# Patient Record
Sex: Female | Born: 1940
Health system: Southern US, Community
[De-identification: ages and names within clinical notes are randomized; demographics above are authoritative.]

## PROBLEM LIST (undated history)

## (undated) DIAGNOSIS — H269 Unspecified cataract: Secondary | ICD-10-CM

## (undated) DIAGNOSIS — G4730 Sleep apnea, unspecified: Secondary | ICD-10-CM

## (undated) DIAGNOSIS — Z9289 Personal history of other medical treatment: Secondary | ICD-10-CM

## (undated) DIAGNOSIS — M65319 Trigger thumb, unspecified thumb: Secondary | ICD-10-CM

## (undated) DIAGNOSIS — M810 Age-related osteoporosis without current pathological fracture: Secondary | ICD-10-CM

## (undated) DIAGNOSIS — E119 Type 2 diabetes mellitus without complications: Secondary | ICD-10-CM

## (undated) DIAGNOSIS — M1990 Unspecified osteoarthritis, unspecified site: Secondary | ICD-10-CM

## (undated) DIAGNOSIS — E538 Deficiency of other specified B group vitamins: Secondary | ICD-10-CM

## (undated) DIAGNOSIS — Z860100 Personal history of colon polyps, unspecified: Secondary | ICD-10-CM

## (undated) DIAGNOSIS — G8929 Other chronic pain: Secondary | ICD-10-CM

## (undated) DIAGNOSIS — J189 Pneumonia, unspecified organism: Secondary | ICD-10-CM

## (undated) DIAGNOSIS — N183 Chronic kidney disease, stage 3 unspecified: Secondary | ICD-10-CM

## (undated) DIAGNOSIS — E039 Hypothyroidism, unspecified: Secondary | ICD-10-CM

## (undated) DIAGNOSIS — R519 Headache, unspecified: Secondary | ICD-10-CM

## (undated) DIAGNOSIS — T7840XA Allergy, unspecified, initial encounter: Secondary | ICD-10-CM

## (undated) DIAGNOSIS — G5600 Carpal tunnel syndrome, unspecified upper limb: Secondary | ICD-10-CM

## (undated) DIAGNOSIS — G629 Polyneuropathy, unspecified: Secondary | ICD-10-CM

## (undated) DIAGNOSIS — R51 Headache: Secondary | ICD-10-CM

## (undated) DIAGNOSIS — I5032 Chronic diastolic (congestive) heart failure: Secondary | ICD-10-CM

## (undated) DIAGNOSIS — D649 Anemia, unspecified: Secondary | ICD-10-CM

## (undated) DIAGNOSIS — M5030 Other cervical disc degeneration, unspecified cervical region: Secondary | ICD-10-CM

## (undated) DIAGNOSIS — D573 Sickle-cell trait: Secondary | ICD-10-CM

## (undated) DIAGNOSIS — I10 Essential (primary) hypertension: Secondary | ICD-10-CM

## (undated) DIAGNOSIS — M542 Cervicalgia: Secondary | ICD-10-CM

## (undated) DIAGNOSIS — K279 Peptic ulcer, site unspecified, unspecified as acute or chronic, without hemorrhage or perforation: Secondary | ICD-10-CM

## (undated) DIAGNOSIS — E785 Hyperlipidemia, unspecified: Secondary | ICD-10-CM

## (undated) HISTORY — DX: Sleep apnea, unspecified: G47.30

## (undated) HISTORY — DX: Hyperlipidemia, unspecified: E78.5

## (undated) HISTORY — PX: TIBIA HARDWARE REMOVAL: SUR1133

## (undated) HISTORY — PX: FRACTURE SURGERY: SHX138

## (undated) HISTORY — PX: COLONOSCOPY: SHX174

## (undated) HISTORY — DX: Unspecified osteoarthritis, unspecified site: M19.90

## (undated) HISTORY — DX: Allergy, unspecified, initial encounter: T78.40XA

## (undated) HISTORY — DX: Chronic diastolic (congestive) heart failure: I50.32

## (undated) HISTORY — DX: Carpal tunnel syndrome, unspecified upper limb: G56.00

## (undated) HISTORY — DX: Other chronic pain: G89.29

## (undated) HISTORY — PX: JOINT REPLACEMENT: SHX530

## (undated) HISTORY — PX: ANKLE HARDWARE REMOVAL: SHX1149

## (undated) HISTORY — DX: Trigger thumb, unspecified thumb: M65.319

## (undated) HISTORY — PX: TOTAL KNEE ARTHROPLASTY: SHX125

## (undated) HISTORY — DX: Anemia, unspecified: D64.9

## (undated) HISTORY — DX: Hypothyroidism, unspecified: E03.9

## (undated) HISTORY — DX: Headache: R51

## (undated) HISTORY — DX: Headache, unspecified: R51.9

## (undated) HISTORY — DX: Polyneuropathy, unspecified: G62.9

## (undated) HISTORY — PX: CATARACT EXTRACTION W/ INTRAOCULAR LENS  IMPLANT, BILATERAL: SHX1307

## (undated) HISTORY — DX: Sickle-cell trait: D57.3

## (undated) HISTORY — DX: Other cervical disc degeneration, unspecified cervical region: M50.30

## (undated) HISTORY — DX: Personal history of colonic polyps: Z86.010

## (undated) HISTORY — DX: Peptic ulcer, site unspecified, unspecified as acute or chronic, without hemorrhage or perforation: K27.9

## (undated) HISTORY — DX: Personal history of colon polyps, unspecified: Z86.0100

## (undated) HISTORY — PX: ABDOMINAL HYSTERECTOMY: SHX81

## (undated) HISTORY — DX: Unspecified cataract: H26.9

## (undated) HISTORY — PX: TIBIA FRACTURE SURGERY: SHX806

## (undated) HISTORY — DX: Deficiency of other specified B group vitamins: E53.8

## (undated) HISTORY — DX: Age-related osteoporosis without current pathological fracture: M81.0

## (undated) HISTORY — PX: EXPLORATORY LAPAROTOMY: SUR591

## (undated) HISTORY — DX: Cervicalgia: M54.2

## (undated) HISTORY — DX: Essential (primary) hypertension: I10

---
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
## 1998-04-17 ENCOUNTER — Ambulatory Visit (HOSPITAL_COMMUNITY): Admission: RE | Admit: 1998-04-17 | Discharge: 1998-04-17 | Payer: Self-pay | Admitting: Family Medicine

## 1998-10-12 ENCOUNTER — Ambulatory Visit (HOSPITAL_COMMUNITY): Admission: RE | Admit: 1998-10-12 | Discharge: 1998-10-12 | Payer: Self-pay | Admitting: Family Medicine

## 1998-10-12 ENCOUNTER — Encounter: Payer: Self-pay | Admitting: Family Medicine

## 1998-11-26 ENCOUNTER — Ambulatory Visit (HOSPITAL_COMMUNITY): Admission: RE | Admit: 1998-11-26 | Discharge: 1998-11-26 | Payer: Self-pay | Admitting: Family Medicine

## 1998-11-26 ENCOUNTER — Encounter: Payer: Self-pay | Admitting: Family Medicine

## 1999-08-10 ENCOUNTER — Encounter: Payer: Self-pay | Admitting: Gastroenterology

## 1999-08-10 ENCOUNTER — Ambulatory Visit (HOSPITAL_COMMUNITY): Admission: RE | Admit: 1999-08-10 | Discharge: 1999-08-10 | Payer: Self-pay | Admitting: Gastroenterology

## 2000-01-13 ENCOUNTER — Ambulatory Visit (HOSPITAL_COMMUNITY): Admission: RE | Admit: 2000-01-13 | Discharge: 2000-01-13 | Payer: Self-pay | Admitting: Gastroenterology

## 2000-03-24 ENCOUNTER — Ambulatory Visit (HOSPITAL_COMMUNITY): Admission: RE | Admit: 2000-03-24 | Discharge: 2000-03-24 | Payer: Self-pay | Admitting: *Deleted

## 2000-10-26 ENCOUNTER — Encounter: Payer: Self-pay | Admitting: Family Medicine

## 2000-10-26 ENCOUNTER — Ambulatory Visit (HOSPITAL_COMMUNITY): Admission: RE | Admit: 2000-10-26 | Discharge: 2000-10-26 | Payer: Self-pay | Admitting: Family Medicine

## 2000-12-11 ENCOUNTER — Other Ambulatory Visit: Admission: RE | Admit: 2000-12-11 | Discharge: 2000-12-11 | Payer: Self-pay | Admitting: Family Medicine

## 2002-05-13 ENCOUNTER — Emergency Department (HOSPITAL_COMMUNITY): Admission: EM | Admit: 2002-05-13 | Discharge: 2002-05-13 | Payer: Self-pay | Admitting: Emergency Medicine

## 2003-05-02 ENCOUNTER — Ambulatory Visit (HOSPITAL_COMMUNITY): Admission: RE | Admit: 2003-05-02 | Discharge: 2003-05-02 | Payer: Self-pay | Admitting: Family Medicine

## 2003-05-02 ENCOUNTER — Ambulatory Visit (HOSPITAL_COMMUNITY): Admission: RE | Admit: 2003-05-02 | Discharge: 2003-05-02 | Payer: Self-pay | Admitting: Internal Medicine

## 2003-05-02 ENCOUNTER — Encounter: Payer: Self-pay | Admitting: Family Medicine

## 2003-05-02 ENCOUNTER — Encounter: Payer: Self-pay | Admitting: Internal Medicine

## 2004-01-01 ENCOUNTER — Other Ambulatory Visit: Admission: RE | Admit: 2004-01-01 | Discharge: 2004-01-01 | Payer: Self-pay | Admitting: Family Medicine

## 2004-11-14 DIAGNOSIS — Z9289 Personal history of other medical treatment: Secondary | ICD-10-CM

## 2004-11-14 HISTORY — PX: FEMUR FRACTURE SURGERY: SHX633

## 2004-11-14 HISTORY — DX: Personal history of other medical treatment: Z92.89

## 2005-02-21 ENCOUNTER — Emergency Department (HOSPITAL_COMMUNITY): Admission: EM | Admit: 2005-02-21 | Discharge: 2005-02-22 | Payer: Self-pay | Admitting: Emergency Medicine

## 2005-03-03 ENCOUNTER — Ambulatory Visit: Payer: Self-pay | Admitting: Internal Medicine

## 2005-03-03 ENCOUNTER — Ambulatory Visit (HOSPITAL_COMMUNITY): Admission: RE | Admit: 2005-03-03 | Discharge: 2005-03-03 | Payer: Self-pay | Admitting: Internal Medicine

## 2005-05-30 ENCOUNTER — Encounter: Payer: Self-pay | Admitting: Internal Medicine

## 2005-06-29 ENCOUNTER — Inpatient Hospital Stay (HOSPITAL_COMMUNITY): Admission: AC | Admit: 2005-06-29 | Discharge: 2005-07-06 | Payer: Self-pay | Admitting: Emergency Medicine

## 2005-06-29 ENCOUNTER — Ambulatory Visit: Payer: Self-pay | Admitting: Physical Medicine & Rehabilitation

## 2005-07-03 ENCOUNTER — Ambulatory Visit: Payer: Self-pay | Admitting: Pulmonary Disease

## 2005-07-06 ENCOUNTER — Inpatient Hospital Stay (HOSPITAL_COMMUNITY)
Admission: RE | Admit: 2005-07-06 | Discharge: 2005-07-22 | Payer: Self-pay | Admitting: Physical Medicine & Rehabilitation

## 2005-08-22 ENCOUNTER — Ambulatory Visit: Payer: Self-pay | Admitting: Nurse Practitioner

## 2005-09-09 ENCOUNTER — Ambulatory Visit: Payer: Self-pay | Admitting: *Deleted

## 2006-01-12 ENCOUNTER — Ambulatory Visit: Payer: Self-pay | Admitting: Internal Medicine

## 2006-03-07 ENCOUNTER — Encounter: Payer: Self-pay | Admitting: Internal Medicine

## 2007-06-15 DIAGNOSIS — G5600 Carpal tunnel syndrome, unspecified upper limb: Secondary | ICD-10-CM

## 2007-06-15 HISTORY — DX: Carpal tunnel syndrome, unspecified upper limb: G56.00

## 2007-06-25 ENCOUNTER — Ambulatory Visit: Payer: Self-pay | Admitting: Internal Medicine

## 2007-06-25 LAB — CONVERTED CEMR LAB
AST: 24 units/L (ref 0–37)
Alkaline Phosphatase: 65 units/L (ref 39–117)
Basophils Absolute: 0 10*3/uL (ref 0.0–0.1)
Bilirubin, Direct: 0.1 mg/dL (ref 0.0–0.3)
CO2: 29 meq/L (ref 19–32)
Chloride: 107 meq/L (ref 96–112)
Eosinophils Relative: 11.3 % — ABNORMAL HIGH (ref 0.0–5.0)
GFR calc Af Amer: 49 mL/min
GFR calc non Af Amer: 40 mL/min
Glucose, Bld: 100 mg/dL — ABNORMAL HIGH (ref 70–99)
HDL: 36.6 mg/dL — ABNORMAL LOW (ref >39.0)
INR: 0.9 (ref 0.9–2.0)
LDL Cholesterol: 92 mg/dL (ref 0–99)
Lymphocytes Relative: 33.4 % (ref 12.0–46.0)
MCHC: 34.2 g/dL (ref 30.0–36.0)
Neutro Abs: 3 10*3/uL (ref 1.4–7.7)
Neutrophils Relative %: 46.5 % (ref 43.0–77.0)
Platelets: 132 10*3/uL — ABNORMAL LOW (ref 150–400)
Total Bilirubin: 0.8 mg/dL (ref 0.3–1.2)
VLDL: 27 mg/dL (ref 0–40)
WBC: 6.3 10*3/uL (ref 4.5–10.5)

## 2007-07-25 ENCOUNTER — Ambulatory Visit: Payer: Self-pay | Admitting: Gastroenterology

## 2007-07-31 ENCOUNTER — Encounter: Payer: Self-pay | Admitting: Gastroenterology

## 2007-07-31 ENCOUNTER — Ambulatory Visit (HOSPITAL_COMMUNITY): Admission: RE | Admit: 2007-07-31 | Discharge: 2007-07-31 | Payer: Self-pay | Admitting: Gastroenterology

## 2007-08-02 ENCOUNTER — Ambulatory Visit: Payer: Self-pay | Admitting: Gastroenterology

## 2007-11-05 ENCOUNTER — Ambulatory Visit: Payer: Self-pay | Admitting: Internal Medicine

## 2007-11-05 DIAGNOSIS — M79609 Pain in unspecified limb: Secondary | ICD-10-CM | POA: Insufficient documentation

## 2007-11-05 DIAGNOSIS — B019 Varicella without complication: Secondary | ICD-10-CM | POA: Insufficient documentation

## 2007-11-05 DIAGNOSIS — M6740 Ganglion, unspecified site: Secondary | ICD-10-CM | POA: Insufficient documentation

## 2007-11-05 DIAGNOSIS — R079 Chest pain, unspecified: Secondary | ICD-10-CM | POA: Insufficient documentation

## 2007-11-10 DIAGNOSIS — E039 Hypothyroidism, unspecified: Secondary | ICD-10-CM | POA: Insufficient documentation

## 2007-11-10 DIAGNOSIS — E785 Hyperlipidemia, unspecified: Secondary | ICD-10-CM | POA: Insufficient documentation

## 2007-11-10 DIAGNOSIS — J45909 Unspecified asthma, uncomplicated: Secondary | ICD-10-CM

## 2007-11-10 DIAGNOSIS — J309 Allergic rhinitis, unspecified: Secondary | ICD-10-CM

## 2007-11-10 DIAGNOSIS — D638 Anemia in other chronic diseases classified elsewhere: Secondary | ICD-10-CM

## 2007-11-10 DIAGNOSIS — Z860100 Personal history of colon polyps, unspecified: Secondary | ICD-10-CM | POA: Insufficient documentation

## 2007-11-10 DIAGNOSIS — K279 Peptic ulcer, site unspecified, unspecified as acute or chronic, without hemorrhage or perforation: Secondary | ICD-10-CM | POA: Insufficient documentation

## 2007-11-22 ENCOUNTER — Encounter: Payer: Self-pay | Admitting: Internal Medicine

## 2007-11-22 ENCOUNTER — Telehealth: Payer: Self-pay | Admitting: Internal Medicine

## 2008-01-31 DIAGNOSIS — G5603 Carpal tunnel syndrome, bilateral upper limbs: Secondary | ICD-10-CM

## 2008-01-31 DIAGNOSIS — IMO0002 Reserved for concepts with insufficient information to code with codable children: Secondary | ICD-10-CM | POA: Insufficient documentation

## 2008-01-31 DIAGNOSIS — K649 Unspecified hemorrhoids: Secondary | ICD-10-CM | POA: Insufficient documentation

## 2008-01-31 DIAGNOSIS — D573 Sickle-cell trait: Secondary | ICD-10-CM | POA: Insufficient documentation

## 2008-01-31 DIAGNOSIS — K625 Hemorrhage of anus and rectum: Secondary | ICD-10-CM | POA: Insufficient documentation

## 2008-03-13 ENCOUNTER — Ambulatory Visit: Payer: Self-pay | Admitting: Internal Medicine

## 2008-03-13 DIAGNOSIS — I10 Essential (primary) hypertension: Secondary | ICD-10-CM | POA: Insufficient documentation

## 2008-03-17 ENCOUNTER — Encounter: Payer: Self-pay | Admitting: Internal Medicine

## 2008-05-30 ENCOUNTER — Ambulatory Visit: Payer: Self-pay | Admitting: Internal Medicine

## 2008-05-30 DIAGNOSIS — R05 Cough: Secondary | ICD-10-CM

## 2008-05-30 DIAGNOSIS — J069 Acute upper respiratory infection, unspecified: Secondary | ICD-10-CM | POA: Insufficient documentation

## 2008-06-30 ENCOUNTER — Encounter: Payer: Self-pay | Admitting: Internal Medicine

## 2008-06-30 ENCOUNTER — Telehealth (INDEPENDENT_AMBULATORY_CARE_PROVIDER_SITE_OTHER): Payer: Self-pay | Admitting: *Deleted

## 2008-08-18 ENCOUNTER — Encounter: Payer: Self-pay | Admitting: Internal Medicine

## 2008-08-27 ENCOUNTER — Ambulatory Visit: Payer: Self-pay | Admitting: Internal Medicine

## 2008-08-27 DIAGNOSIS — R413 Other amnesia: Secondary | ICD-10-CM

## 2008-09-03 ENCOUNTER — Ambulatory Visit: Payer: Self-pay

## 2008-09-03 ENCOUNTER — Encounter: Payer: Self-pay | Admitting: Internal Medicine

## 2008-09-04 ENCOUNTER — Encounter: Payer: Self-pay | Admitting: Internal Medicine

## 2008-09-06 ENCOUNTER — Encounter: Admission: RE | Admit: 2008-09-06 | Discharge: 2008-09-06 | Payer: Self-pay | Admitting: Internal Medicine

## 2008-09-22 ENCOUNTER — Ambulatory Visit: Payer: Self-pay | Admitting: Internal Medicine

## 2008-09-22 ENCOUNTER — Telehealth: Payer: Self-pay | Admitting: Internal Medicine

## 2008-10-14 ENCOUNTER — Inpatient Hospital Stay (HOSPITAL_COMMUNITY): Admission: RE | Admit: 2008-10-14 | Discharge: 2008-10-17 | Payer: Self-pay | Admitting: Orthopedic Surgery

## 2008-10-20 ENCOUNTER — Ambulatory Visit: Payer: Self-pay | Admitting: Internal Medicine

## 2008-10-20 DIAGNOSIS — D6489 Other specified anemias: Secondary | ICD-10-CM

## 2008-10-20 DIAGNOSIS — D696 Thrombocytopenia, unspecified: Secondary | ICD-10-CM

## 2008-10-20 DIAGNOSIS — N189 Chronic kidney disease, unspecified: Secondary | ICD-10-CM

## 2008-10-20 LAB — CONVERTED CEMR LAB
Basophils Absolute: 0 10*3/uL (ref 0.0–0.1)
Basophils Relative: 0 % (ref 0.0–3.0)
Calcium: 9.5 mg/dL (ref 8.4–10.5)
Creatinine, Ser: 1.5 mg/dL — ABNORMAL HIGH (ref 0.4–1.2)
Eosinophils Absolute: 0.2 10*3/uL (ref 0.0–0.7)
Eosinophils Relative: 2 % (ref 0.0–5.0)
GFR calc Af Amer: 45 mL/min
GFR calc non Af Amer: 37 mL/min
HCT: 30.9 % — ABNORMAL LOW (ref 36.0–46.0)
MCV: 91.9 fL (ref 78.0–100.0)
Monocytes Absolute: 1 10*3/uL (ref 0.1–1.0)
Neutrophils Relative %: 69.6 % (ref 43.0–77.0)
Platelets: 174 10*3/uL (ref 150–400)
RDW: 14.6 % (ref 11.5–14.6)
Sodium: 141 meq/L (ref 135–145)
WBC: 10.1 10*3/uL (ref 4.5–10.5)

## 2008-10-21 ENCOUNTER — Encounter: Payer: Self-pay | Admitting: Internal Medicine

## 2008-11-05 ENCOUNTER — Ambulatory Visit: Payer: Self-pay | Admitting: Internal Medicine

## 2008-11-05 DIAGNOSIS — R7302 Impaired glucose tolerance (oral): Secondary | ICD-10-CM | POA: Insufficient documentation

## 2008-11-05 DIAGNOSIS — M5430 Sciatica, unspecified side: Secondary | ICD-10-CM

## 2008-11-05 LAB — CONVERTED CEMR LAB
BUN: 21 mg/dL (ref 6–23)
Basophils Relative: 0.2 % (ref 0.0–3.0)
CO2: 31 meq/L (ref 19–32)
Calcium: 9.4 mg/dL (ref 8.4–10.5)
Creatinine, Ser: 1.4 mg/dL — ABNORMAL HIGH (ref 0.4–1.2)
GFR calc Af Amer: 48 mL/min
HCT: 33.3 % — ABNORMAL LOW (ref 36.0–46.0)
Hgb A1c MFr Bld: 6.2 % — ABNORMAL HIGH (ref 4.6–6.0)
Iron: 69 ug/dL (ref 42–145)
MCHC: 33.8 g/dL (ref 30.0–36.0)
MCV: 93.7 fL (ref 78.0–100.0)
Monocytes Relative: 8.1 % (ref 3.0–12.0)
Neutro Abs: 7.1 10*3/uL (ref 1.4–7.7)
Neutrophils Relative %: 70.7 % (ref 43.0–77.0)
Platelets: 161 10*3/uL (ref 150–400)
Potassium: 4 meq/L (ref 3.5–5.1)
RDW: 14.1 % (ref 11.5–14.6)

## 2008-11-18 ENCOUNTER — Telehealth (INDEPENDENT_AMBULATORY_CARE_PROVIDER_SITE_OTHER): Payer: Self-pay | Admitting: *Deleted

## 2008-12-05 ENCOUNTER — Ambulatory Visit: Payer: Self-pay | Admitting: Internal Medicine

## 2008-12-05 ENCOUNTER — Telehealth: Payer: Self-pay | Admitting: Internal Medicine

## 2008-12-17 ENCOUNTER — Telehealth (INDEPENDENT_AMBULATORY_CARE_PROVIDER_SITE_OTHER): Payer: Self-pay | Admitting: *Deleted

## 2009-01-07 ENCOUNTER — Encounter: Payer: Self-pay | Admitting: Internal Medicine

## 2009-01-08 ENCOUNTER — Ambulatory Visit: Payer: Self-pay | Admitting: Internal Medicine

## 2009-01-16 ENCOUNTER — Inpatient Hospital Stay (HOSPITAL_COMMUNITY): Admission: EM | Admit: 2009-01-16 | Discharge: 2009-01-20 | Payer: Self-pay | Admitting: Emergency Medicine

## 2009-01-19 ENCOUNTER — Ambulatory Visit: Payer: Self-pay | Admitting: Physical Medicine & Rehabilitation

## 2009-01-27 ENCOUNTER — Encounter: Payer: Self-pay | Admitting: Internal Medicine

## 2009-02-03 ENCOUNTER — Encounter: Payer: Self-pay | Admitting: Internal Medicine

## 2009-04-09 ENCOUNTER — Encounter (INDEPENDENT_AMBULATORY_CARE_PROVIDER_SITE_OTHER): Payer: Self-pay | Admitting: *Deleted

## 2009-04-16 ENCOUNTER — Telehealth: Payer: Self-pay | Admitting: Internal Medicine

## 2009-04-24 ENCOUNTER — Encounter: Payer: Self-pay | Admitting: Internal Medicine

## 2009-05-01 ENCOUNTER — Encounter: Payer: Self-pay | Admitting: Internal Medicine

## 2009-05-11 ENCOUNTER — Ambulatory Visit: Payer: Self-pay | Admitting: Internal Medicine

## 2009-05-11 DIAGNOSIS — D509 Iron deficiency anemia, unspecified: Secondary | ICD-10-CM

## 2009-05-11 DIAGNOSIS — S82899A Other fracture of unspecified lower leg, initial encounter for closed fracture: Secondary | ICD-10-CM | POA: Insufficient documentation

## 2009-05-11 LAB — CONVERTED CEMR LAB
Basophils Absolute: 0 10*3/uL (ref 0.0–0.1)
Basophils Relative: 0.3 % (ref 0.0–3.0)
CO2: 30 meq/L (ref 19–32)
Chloride: 106 meq/L (ref 96–112)
Creatinine, Ser: 1.2 mg/dL (ref 0.4–1.2)
Eosinophils Absolute: 0.1 10*3/uL (ref 0.0–0.7)
HCT: 36.8 % (ref 36.0–46.0)
HDL: 42.7 mg/dL (ref >39.00)
Hemoglobin: 12.7 g/dL (ref 12.0–15.0)
Hgb A1c MFr Bld: 5.6 % (ref 4.6–6.5)
LDL Cholesterol: 118 mg/dL — ABNORMAL HIGH (ref 0–99)
Lymphs Abs: 1.6 10*3/uL (ref 0.7–4.0)
MCHC: 34.4 g/dL (ref 30.0–36.0)
MCV: 87.5 fL (ref 78.0–100.0)
Monocytes Absolute: 0.5 10*3/uL (ref 0.1–1.0)
Neutro Abs: 3 10*3/uL (ref 1.4–7.7)
Potassium: 4.2 meq/L (ref 3.5–5.1)
RDW: 12.8 % (ref 11.5–14.6)
Total CHOL/HDL Ratio: 4
Transferrin: 225.1 mg/dL (ref 212.0–360.0)
Triglycerides: 106 mg/dL (ref 0.0–149.0)
VLDL: 21.2 mg/dL (ref 0.0–40.0)

## 2009-05-12 ENCOUNTER — Encounter: Payer: Self-pay | Admitting: Internal Medicine

## 2009-05-12 ENCOUNTER — Ambulatory Visit: Payer: Self-pay | Admitting: Internal Medicine

## 2009-05-20 ENCOUNTER — Telehealth (INDEPENDENT_AMBULATORY_CARE_PROVIDER_SITE_OTHER): Payer: Self-pay | Admitting: *Deleted

## 2009-05-27 ENCOUNTER — Encounter: Admission: RE | Admit: 2009-05-27 | Discharge: 2009-05-27 | Payer: Self-pay | Admitting: Specialist

## 2009-07-23 ENCOUNTER — Ambulatory Visit: Payer: Self-pay | Admitting: Internal Medicine

## 2009-07-23 ENCOUNTER — Telehealth: Payer: Self-pay | Admitting: Internal Medicine

## 2009-07-23 DIAGNOSIS — R209 Unspecified disturbances of skin sensation: Secondary | ICD-10-CM | POA: Insufficient documentation

## 2009-07-24 ENCOUNTER — Telehealth (INDEPENDENT_AMBULATORY_CARE_PROVIDER_SITE_OTHER): Payer: Self-pay | Admitting: *Deleted

## 2009-12-14 ENCOUNTER — Encounter: Payer: Self-pay | Admitting: Internal Medicine

## 2010-01-21 ENCOUNTER — Ambulatory Visit: Payer: Self-pay | Admitting: Internal Medicine

## 2010-03-25 ENCOUNTER — Encounter: Payer: Self-pay | Admitting: Internal Medicine

## 2010-03-29 ENCOUNTER — Encounter: Payer: Self-pay | Admitting: Internal Medicine

## 2010-03-30 ENCOUNTER — Encounter: Payer: Self-pay | Admitting: Internal Medicine

## 2010-04-21 ENCOUNTER — Ambulatory Visit: Payer: Self-pay | Admitting: Internal Medicine

## 2010-04-21 DIAGNOSIS — R5383 Other fatigue: Secondary | ICD-10-CM | POA: Insufficient documentation

## 2010-04-21 DIAGNOSIS — E559 Vitamin D deficiency, unspecified: Secondary | ICD-10-CM

## 2010-04-21 DIAGNOSIS — G609 Hereditary and idiopathic neuropathy, unspecified: Secondary | ICD-10-CM | POA: Insufficient documentation

## 2010-04-22 LAB — CONVERTED CEMR LAB
ALT: 15 U/L (ref 0–35)
AST: 21 U/L (ref 0–37)
Albumin: 4.1 g/dL (ref 3.5–5.2)
Alkaline Phosphatase: 62 U/L (ref 39–117)
BUN: 11 mg/dL (ref 6–23)
Basophils Absolute: 0 K/uL (ref 0.0–0.1)
Basophils Relative: 0.3 % (ref 0.0–3.0)
Bilirubin Urine: NEGATIVE
Bilirubin, Direct: 0.1 mg/dL (ref 0.0–0.3)
CO2: 30 meq/L (ref 19–32)
Calcium: 9.1 mg/dL (ref 8.4–10.5)
Chloride: 107 meq/L (ref 96–112)
Cholesterol: 191 mg/dL (ref 0–200)
Creatinine, Ser: 1.2 mg/dL (ref 0.4–1.2)
Creatinine,U: 164.6 mg/dL
Eosinophils Absolute: 0.1 K/uL (ref 0.0–0.7)
Eosinophils Relative: 1.3 % (ref 0.0–5.0)
Folate: 10.3 ng/mL
GFR calc non Af Amer: 55.22 mL/min (ref >60)
Glucose, Bld: 95 mg/dL (ref 70–99)
HCT: 35.2 % — ABNORMAL LOW (ref 36.0–46.0)
HDL: 46.8 mg/dL (ref >39.00)
Hemoglobin: 12 g/dL (ref 12.0–15.0)
Hgb A1c MFr Bld: 5.6 % (ref 4.6–6.5)
Iron: 90 ug/dL (ref 42–145)
Ketones, ur: NEGATIVE mg/dL
LDL Cholesterol: 109 mg/dL — ABNORMAL HIGH (ref 0–99)
Lymphocytes Relative: 33.6 % (ref 12.0–46.0)
Lymphs Abs: 1.8 K/uL (ref 0.7–4.0)
MCHC: 34 g/dL (ref 30.0–36.0)
MCV: 88.7 fL (ref 78.0–100.0)
Microalb Creat Ratio: 0.6 mg/g (ref 0.0–30.0)
Microalb, Ur: 1 mg/dL (ref 0.0–1.9)
Monocytes Absolute: 0.6 K/uL (ref 0.1–1.0)
Monocytes Relative: 10.3 % (ref 3.0–12.0)
Neutro Abs: 2.9 K/uL (ref 1.4–7.7)
Neutrophils Relative %: 54.5 % (ref 43.0–77.0)
Nitrite: NEGATIVE
Platelets: 92 K/uL — ABNORMAL LOW (ref 150.0–400.0)
Potassium: 3.9 meq/L (ref 3.5–5.1)
RBC: 3.97 M/uL (ref 3.87–5.11)
RDW: 13.8 % (ref 11.5–14.6)
Saturation Ratios: 26.6 % (ref 20.0–50.0)
Sed Rate: 45 mm/h — ABNORMAL HIGH (ref 0–22)
Sodium: 144 meq/L (ref 135–145)
Specific Gravity, Urine: 1.02 (ref 1.000–1.030)
TSH: 3.96 u[IU]/mL (ref 0.35–5.50)
Total Bilirubin: 0.6 mg/dL (ref 0.3–1.2)
Total CHOL/HDL Ratio: 4
Total Protein, Urine: NEGATIVE mg/dL
Total Protein: 7.4 g/dL (ref 6.0–8.3)
Transferrin: 241.5 mg/dL (ref 212.0–360.0)
Triglycerides: 174 mg/dL — ABNORMAL HIGH (ref 0.0–149.0)
Urine Glucose: NEGATIVE mg/dL
Urobilinogen, UA: 0.2 (ref 0.0–1.0)
VLDL: 34.8 mg/dL (ref 0.0–40.0)
Vitamin B-12: 468 pg/mL (ref 211–911)
WBC: 5.4 10*3/microliter (ref 4.5–10.5)
pH: 6 (ref 5.0–8.0)

## 2010-04-29 ENCOUNTER — Inpatient Hospital Stay (HOSPITAL_COMMUNITY): Admission: RE | Admit: 2010-04-29 | Discharge: 2010-05-03 | Payer: Self-pay | Admitting: Orthopedic Surgery

## 2010-05-11 ENCOUNTER — Encounter: Payer: Self-pay | Admitting: Internal Medicine

## 2010-06-18 ENCOUNTER — Encounter: Payer: Self-pay | Admitting: Internal Medicine

## 2010-06-18 LAB — CONVERTED CEMR LAB
BUN: 8 mg/dL
Basophils Relative: 0 %
CO2: 27 meq/L
Chloride: 107 meq/L
Creatinine, Ser: 1.35 mg/dL
Eosinophils Relative: 2 %
GFR calc Af Amer: 47 mL/min
GFR calc non Af Amer: 39 mL/min
HCT: 38.4 %
Hemoglobin: 13 g/dL
Hgb urine dipstick: NEGATIVE
Ketones, urine, test strip: NEGATIVE
Monocytes Relative: 9 %
Nitrite: NEGATIVE
RBC: 4.32 M/uL
RDW: 14.8 %
Specific Gravity, Urine: 1.016
Urobilinogen, UA: 1
WBC: 8.6 10*3/uL

## 2010-06-23 ENCOUNTER — Encounter: Payer: Self-pay | Admitting: Internal Medicine

## 2010-06-28 ENCOUNTER — Inpatient Hospital Stay (HOSPITAL_COMMUNITY): Admission: RE | Admit: 2010-06-28 | Discharge: 2010-07-01 | Payer: Self-pay | Admitting: Orthopedic Surgery

## 2010-08-06 ENCOUNTER — Ambulatory Visit: Payer: Self-pay | Admitting: Internal Medicine

## 2010-08-06 DIAGNOSIS — R197 Diarrhea, unspecified: Secondary | ICD-10-CM | POA: Insufficient documentation

## 2010-08-06 DIAGNOSIS — R300 Dysuria: Secondary | ICD-10-CM | POA: Insufficient documentation

## 2010-08-06 DIAGNOSIS — R21 Rash and other nonspecific skin eruption: Secondary | ICD-10-CM

## 2010-08-06 LAB — CONVERTED CEMR LAB
Bilirubin Urine: NEGATIVE
Total Protein, Urine: NEGATIVE mg/dL
Urine Glucose: NEGATIVE mg/dL
Urobilinogen, UA: 0.2 (ref 0.0–1.0)

## 2010-12-04 ENCOUNTER — Encounter: Payer: Self-pay | Admitting: Internal Medicine

## 2010-12-07 ENCOUNTER — Ambulatory Visit
Admission: RE | Admit: 2010-12-07 | Discharge: 2010-12-07 | Payer: Self-pay | Source: Home / Self Care | Attending: Internal Medicine | Admitting: Internal Medicine

## 2010-12-12 LAB — CONVERTED CEMR LAB
ALT: 19 units/L (ref 0–35)
AST: 24 units/L (ref 0–37)
Albumin: 3.6 g/dL (ref 3.5–5.2)
BUN: 16 mg/dL (ref 6–23)
Basophils Absolute: 0 10*3/uL (ref 0.0–0.1)
CO2: 28 meq/L (ref 19–32)
Eosinophils Absolute: 0.1 10*3/uL (ref 0.0–0.7)
Folate: 9.1 ng/mL
Glucose, Bld: 97 mg/dL (ref 70–99)
HCT: 32.7 % — ABNORMAL LOW (ref 36.0–46.0)
HDL: 32 mg/dL — ABNORMAL LOW (ref >39.0)
Lymphocytes Relative: 37.6 % (ref 12.0–46.0)
MCHC: 33.9 g/dL (ref 30.0–36.0)
Neutrophils Relative %: 49.2 % (ref 43.0–77.0)
RBC: 3.37 M/uL — ABNORMAL LOW (ref 3.87–5.11)
RDW: 14.9 % — ABNORMAL HIGH (ref 11.5–14.6)
TSH: 1.47 microintl units/mL (ref 0.35–5.50)
Total Bilirubin: 0.7 mg/dL (ref 0.3–1.2)
Total Protein: 7.3 g/dL (ref 6.0–8.3)
Varicella IgG: 4.1 — ABNORMAL HIGH
Vit D, 1,25-Dihydroxy: 25 — ABNORMAL LOW (ref 30–89)
Vitamin B-12: 107 pg/mL — ABNORMAL LOW (ref 211–911)

## 2010-12-16 NOTE — Assessment & Plan Note (Signed)
Summary: diarrhea/#/cd   Vital Signs:  Patient profile:   70 year old female Height:      65 inches Weight:      222.50 pounds BMI:     37.16 O2 Sat:      94 % on Room air Temp:     99.5 degrees F oral Pulse rate:   68 / minute BP sitting:   122 / 68  (left arm) Cuff size:   large  Vitals Entered By: Zella Ball Ewing CMA [facility] Dull) (August 06, 2010 9:21 AM)  O2 Flow:  Room air CC: Diarrhea for 5 days, discuss medications/RE   CC:  Diarrhea for 5 days and discuss medications/RE.  History of Present Illness: here with acute;  c/o mild loose stools and dysuria for 5 days, now improved; has mild rash to glabellar region of the face ? healing vesicular type;  no diarhea today; getting PT twice per wk;  was exposed to to grandson with viral illness several days prior to her own and sweats and fever now improved as well;  Pt denies CP, worsening sob, doe, wheezing, orthopnea, pnd, worsening LE edema, palps, dizziness or syncope  Pt denies new neuro symptoms such as headache, facial or extremity weakness  Denies onset polydipsia or polyuria  Problems Prior to Update: 1)  Rash-nonvesicular  (ICD-782.1) 2)  Dysuria  (ICD-788.1) 3)  Diarrhea, Acute  (ICD-787.91) 4)  Preventive Health Care  (ICD-V70.0) 5)  Fatigue  (ICD-780.79) 6)  Peripheral Neuropathy  (ICD-356.9) 7)  Vitamin D Deficiency  (ICD-268.9) 8)  Disturbance of Skin Sensation  (ICD-782.0) 9)  Anemia, Iron Deficiency  (ICD-280.9) 10)  Fracture, Leg, Left  (ICD-827.0) 11)  Diabetes Mellitus, Type II  (ICD-250.00) 12)  Sciatica, Right  (ICD-724.3) 13)  Thrombocytopenia  (ICD-287.5) 14)  Other Specified Anemias  (ICD-285.8) 15)  Renal Insufficiency  (ICD-588.9) 16)  Preoperative Examination  (ICD-V72.84) 17)  Memory Loss  (ICD-780.93) 18)  Cough  (ICD-786.2) 19)  Uri  (ICD-465.9) 20)  Hypertension  (ICD-401.9) 21)  Carpal Tunnel Syndrome, Hx of  (ICD-V12.49) 22)  Degenerative Disc Disease  (ICD-722.6) 23)  Obesity   (ICD-278.00) 24)  Sickle Cell Trait  (ICD-282.5) 25)  Hemorrhoids  (ICD-455.6) 26)  Rectal Bleeding  (ICD-569.3) 27)  Lateral Epicondylitis, Left  (ICD-726.32) 28)  Ganglion Cyst, Wrist, Left  (ICD-727.41) 29)  Arm Pain, Left  (ICD-729.5) 30)  Allergic Rhinitis  (ICD-477.9) 31)  Peptic Ulcer Disease  (ICD-533.90) 32)  Family History Diabetes 1st Degree Relative  (ICD-V18.0) 33)  Asthma  (ICD-493.90) 34)  Anemia-nos  (ICD-285.9) 35)  Hyperlipidemia  (ICD-272.4) 36)  Hypothyroidism  (ICD-244.9) 37)  Colonic Polyps, Hx of  (ICD-V12.72) 38)  Chest Pain  (ICD-786.50) 39)  Varicella  (ICD-052.9)  Medications Prior to Update: 1)  Vicodin 5-500 Mg  Tabs (Hydrocodone-Acetaminophen) .Marland Kitchen.. 1 By Mouth Two Times A Day As Needed 2)  Atenolol 50 Mg  Tabs (Atenolol) .Marland Kitchen.. 1 By Mouth Once Daily 3)  Adult Aspirin Ec Low Strength 81 Mg  Tbec (Aspirin) .Marland Kitchen.. 1 By Mouth Qd 4)  Cetirizine Hcl 10 Mg Tabs (Cetirizine Hcl) .Marland Kitchen.. 1 By Mouth Once Daily 5)  Methocarbamol 500 Mg Tabs (Methocarbamol) .Marland Kitchen.. 1 Po Q 6 Hrs As Needed 6)  Cyanocobalamin 1000 Mcg/ml Soln (Cyanocobalamin) .Marland Kitchen.. 1 Cc Q Mo Im 7)  Calcium-Vitamin D 500-200 Mg-Unit Tabs (Calcium Carbonate-Vitamin D) .... 2po Two Times A Day 8)  Alendronate Sodium 70 Mg Tabs (Alendronate Sodium) .Marland Kitchen.. 1 By Mouth Q Wk 9)  Iron  325 (65 Fe) Mg Tabs (Ferrous Sulfate) .Marland Kitchen.. 1 By Mouth Once Daily 10)  Cod Liver Oil  Caps (Cod Liver Oil) .Marland Kitchen.. 1 By Mouth Once Daily 11)  Vitamin C 500 Mg Tabs (Ascorbic Acid) .Marland Kitchen.. 1 By Mouth Once Daily  Current Medications (verified): 1)  Vicodin 5-500 Mg  Tabs (Hydrocodone-Acetaminophen) .Marland Kitchen.. 1 By Mouth Two Times A Day As Needed 2)  Atenolol 50 Mg  Tabs (Atenolol) .Marland Kitchen.. 1 By Mouth Once Daily 3)  Adult Aspirin Ec Low Strength 81 Mg  Tbec (Aspirin) .Marland Kitchen.. 1 By Mouth Qd 4)  Cetirizine Hcl 10 Mg Tabs (Cetirizine Hcl) .Marland Kitchen.. 1 By Mouth Once Daily 5)  Cyanocobalamin 1000 Mcg/ml Soln (Cyanocobalamin) .Marland Kitchen.. 1 Cc Q Mo Im 6)  Calcium-Vitamin D 500-200  Mg-Unit Tabs (Calcium Carbonate-Vitamin D) .... 2po Two Times A Day 7)  Alendronate Sodium 70 Mg Tabs (Alendronate Sodium) .Marland Kitchen.. 1 By Mouth Q Wk 8)  Iron 325 (65 Fe) Mg Tabs (Ferrous Sulfate) .Marland Kitchen.. 1 By Mouth Once Daily 9)  Cod Liver Oil  Caps (Cod Liver Oil) .Marland Kitchen.. 1 By Mouth Once Daily 10)  Vitamin C 500 Mg Tabs (Ascorbic Acid) .Marland Kitchen.. 1 By Mouth Once Daily 11)  Lotrisone 1-0.05 % Crea (Clotrimazole-Betamethasone) .... Use Asd Two Times A Day As Needed 12)  Promethazine Hcl 25 Mg Tabs (Promethazine Hcl) .Marland Kitchen.. 1 By Mouth Q 6 Hrs As Needed Nausea  Allergies (verified): 1)  ! * Naproxen  Past History:  Past Medical History: Last updated: 04/21/2010 Colonic polyps, hx of chronic headaches/chronic pain Hypothyroidism Hyperlipidemia Anemia-NOS DJD - spine, hands Asthma hearing loss sickle cell trait migraine Peptic ulcer disease Allergic rhinitis cervical spine DDD right CTS - severe - 8/08 emg/ncs hx of stenosing left thumb tenosynovitis left CTS - moderate 8/08 emg/ncs Hypertension Diabetes mellitus, type II - diet vit d deficiency Peripheral neuropathy MD Roster:  Dr Raylene Everts - ortho                    Dr Elmer Picker - opthomology  Past Surgical History: Last updated: 11/05/2007 s/p knee surgury Hysterectomy  Social History: Last updated: 04/21/2010 divorced 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   Head:  normocephalic and atraumatic.   Eyes:  vision grossly intact, pupils equal, and pupils round.   Ears:  R ear normal and L ear normal.   Nose:  no external deformity and no nasal discharge.   Mouth:  pharyngeal erythema and fair dentition.   Neck:  supple and no masses.   Lungs:  normal respiratory effort and normal breath sounds.   Heart:  normal rate and  regular rhythm.   Abdomen:  soft, non-tender, and normal bowel sounds.   Msk:  no acute joint tenderness and no joint swelling.   Extremities:  no edema, no erythema  Skin:  color normal.  with glabellar region mild rash ? healing vesicular type?   Impression & Recommendations:  Problem # 1:  DIARRHEA, ACUTE (ICD-787.91) c/w viral illnes most likely; starting to improve, for immodium as needed   Problem # 2:  DYSURIA (ICD-788.1)  resolved, but with temp will check for subcinicl infection  Orders: T-Culture, Urine ([id number]) TLB-Udip w/ Micro (81001-URINE)  Problem # 3:  RASH-NONVESICULAR (ICD-782.1)  ? very mild shingles  vs other - for trial lotrisone as needed   Her updated medication list for this problem includes:    Lotrisone 1-0.05 % Crea (Clotrimazole-betamethasone) ..... Use asd two times a day as needed  Problem # 4:  HYPERTENSION (ICD-401.9)  Her updated medication list for this problem includes:    Atenolol 50 Mg Tabs (Atenolol) .Marland Kitchen... 1 by mouth once daily  BP today: 122/68 Prior BP: 124/72 (04/21/2010)  Labs Reviewed: K+: 4.4 (06/18/2010) Creat: : 1.35 (06/18/2010)   Chol: 191 (04/21/2010)   HDL: 46.80 (04/21/2010)   LDL: 109 (04/21/2010)   TG: 174.0 (04/21/2010) stable overall by hx and exam, ok to continue meds/tx as is   Complete Medication List: 1)  Vicodin 5-500 Mg Tabs (Hydrocodone-acetaminophen) .Marland Kitchen.. 1 by mouth two times a day as needed 2)  Atenolol 50 Mg Tabs (Atenolol) .Marland Kitchen.. 1 by mouth once daily 3)  Adult Aspirin Ec Low Strength 81 Mg Tbec (Aspirin) .Marland Kitchen.. 1 by mouth qd 4)  Cetirizine Hcl 10 Mg Tabs (Cetirizine hcl) .Marland Kitchen.. 1 by mouth once daily 5)  Cyanocobalamin 1000 Mcg/ml Soln (Cyanocobalamin) .Marland Kitchen.. 1 cc q mo im 6)  Calcium-vitamin D 500-200 Mg-unit Tabs (Calcium carbonate-vitamin d) .... 2po two times a day 7)  Alendronate Sodium 70 Mg Tabs (Alendronate sodium) .Marland Kitchen.. 1 by mouth q wk 8)  Iron 325 (65 Fe) Mg Tabs (Ferrous sulfate) .Marland Kitchen.. 1 by mouth  once daily 9)  Cod Liver Oil Caps (Cod liver oil) .Marland Kitchen.. 1 by mouth once daily 10)  Vitamin C 500 Mg Tabs (Ascorbic acid) .Marland Kitchen.. 1 by mouth once daily 11)  Lotrisone 1-0.05 % Crea (Clotrimazole-betamethasone) .... Use asd two times a day as needed 12)  Promethazine Hcl 25 Mg Tabs (Promethazine hcl) .Marland Kitchen.. 1 by mouth q 6 hrs as needed nausea  Other Orders: Flu Vaccine 78yrs + MEDICARE PATIENTS (G2952) Administration Flu vaccine - MCR (G0008) Vit B12 1000 mcg (J3420) Admin of Therapeutic Inj  intramuscular or subcutaneous (84132)  Patient Instructions: 1)  you had the flu shot, and the b12 shots today 2)  please return as you do for the monthly b12 shots 3)  please try OTC immodium as needed for the diarrhea 4)  you are given the phenergan as needed for the nausea as needed , as well as the generic lotrisone cream for the rash 5)  Continue all previous medications as before this visit  6)  It is OK to continue the alendronate as long as it has been less than 5 yrs (should stop at 5 yrs) (does not cause cancer) 7)  Ok to take stool softner for the constipatoin with the iron (like colace 100 mg two times a day as needed)  8)  Please schedule a follow-up appointment in 4 months. Prescriptions: PROMETHAZINE HCL 25 MG TABS (PROMETHAZINE HCL) 1 by mouth q 6 hrs as needed nausea  #60 x 1   Entered and Authorized by:   Corwin Levins MD   Signed by:   Corwin Levins MD on 08/06/2010   Method used:   Print then Give to Patient   RxID:   [id number] ALENDRONATE SODIUM 70 MG TABS (ALENDRONATE SODIUM) 1 by mouth q wk  #12 x 3   Entered and Authorized by:   Corwin Levins MD   Signed by:   Corwin Levins MD on 08/06/2010   Method used:   Print then Give to Patient   RxID:   [id number] LOTRISONE 1-0.05 % CREA (CLOTRIMAZOLE-BETAMETHASONE) use asd two  times a day as needed  #1 x 1   Entered and Authorized by:   Corwin Levins MD   Signed by:   Corwin Levins MD on 08/06/2010   Method used:   Print then Give  to Patient   RxID:   [id number]   Flu Vaccine Consent Questions     Do you have a history of severe allergic reactions to this vaccine? no    Any prior history of allergic reactions to egg and/or gelatin? no    Do you have a sensitivity to the preservative Thimersol? no    Do you have a past history of Guillan-Barre Syndrome? no    Do you currently have an acute febrile illness? no    Have you ever had a severe reaction to latex? no    Vaccine information given and explained to patient? yes    Are you currently pregnant? no    Lot Number:[id number]   Exp Date:05/14/2011   Site Given  Left Deltoid IMdflu   Medication Administration  Injection # 1:    Medication: Vit B12 1000 mcg    Diagnosis: ANEMIA, IRON DEFICIENCY (ICD-280.9)    Route: IM    Site: R deltoid    Exp Date: 05/2012    Lot #: [id number]    Mfr: [employer]    Patient tolerated injection without complications    Given by: Zella Ball Ewing CMA [facility]) (August 06, 2010 10:14 AM)  Orders Added: 1)  T-Culture, Urine [[id number]] 2)  Flu Vaccine 69yrs + MEDICARE PATIENTS [Q2039] 3)  Administration Flu vaccine - MCR [G0008] 4)  Vit B12 1000 mcg [J3420] 5)  Admin of Therapeutic Inj  intramuscular or subcutaneous [96372] 6)  TLB-Udip w/ Micro [81001-URINE] 7)  Est. Patient Level IV [75643]

## 2010-12-16 NOTE — Assessment & Plan Note (Signed)
Summary: 6 MO ROV /NWS  #   Vital Signs:  Patient profile:   70 year old female Height:      64 inches Weight:      236.25 pounds BMI:     40.70 O2 Sat:      96 % on Room air Temp:     98.3 degrees F oral Pulse rate:   60 / minute BP sitting:   124 / 72  (left arm) Cuff size:   large  Vitals Entered ByZella Ball Ewing (April 21, 2010 10:06 AM)  O2 Flow:  Room air  CC: 6 month ROV/RE   CC:  6 month ROV/RE.  History of Present Illness: here to f/u;  needs several med refills, overall doing ok - Pt denies CP, sob, doe, wheezing, orthopnea, pnd, worsening LE edema, palps, dizziness or syncope   Pt denies new neuro symptoms such as headache, facial or extremity weakness  Pt denies polydipsia, polyuria, or low sugar symptoms such as shakiness improved with eating.  Overall good compliance with meds, trying to follow low chol, DM diet, wt stable, little excercise however  Does have peripheral numbness and discomfort to legs below the knees with feeling of coolness but skin feels warm, a kind of pins and needles sometimes.  Claritin also not working as well for the mild nasal allegic symptoms - still with sneeze, itch on occasion. Here for wellness Diet: Heart Healthy or DM if diabetic Physical Activities: Sedentary Depression/mood screen: Negative Hearing: Intact bilateral Visual Acuity: Grossly normal, gets exam yearly, had recent left cataract  ADL's: Capable  Fall Risk: mild, walks with cane, due for f/u ortho surgury - dr Charlann Boxer - to remove metal rod to LLE Home Safety: Good Cognitive Impairment:  Gen appearance, affect, speech, memory, attention & motor skills grossly intact End-of-Life Planning: Advance directive - Full code/I agree   Preventive Screening-Counseling & Management      Drug Use:  no.    Problems Prior to Update: 1)  Fatigue  (ICD-780.79) 2)  Peripheral Neuropathy  (ICD-356.9) 3)  Vitamin D Deficiency  (ICD-268.9) 4)  Disturbance of Skin Sensation  (ICD-782.0) 5)   Anemia, Iron Deficiency  (ICD-280.9) 6)  Fracture, Leg, Left  (ICD-827.0) 7)  Diabetes Mellitus, Type II  (ICD-250.00) 8)  Glucose Intolerance  (ICD-271.3) 9)  Sciatica, Right  (ICD-724.3) 10)  Thrombocytopenia  (ICD-287.5) 11)  Other Specified Anemias  (ICD-285.8) 12)  Renal Insufficiency  (ICD-588.9) 13)  Preoperative Examination  (ICD-V72.84) 14)  Memory Loss  (ICD-780.93) 15)  Cough  (ICD-786.2) 16)  Uri  (ICD-465.9) 17)  Hypertension  (ICD-401.9) 18)  Carpal Tunnel Syndrome, Hx of  (ICD-V12.49) 19)  Degenerative Disc Disease  (ICD-722.6) 20)  Obesity  (ICD-278.00) 21)  Sickle Cell Trait  (ICD-282.5) 22)  Hemorrhoids  (ICD-455.6) 23)  Rectal Bleeding  (ICD-569.3) 24)  Lateral Epicondylitis, Left  (ICD-726.32) 25)  Ganglion Cyst, Wrist, Left  (ICD-727.41) 26)  Arm Pain, Left  (ICD-729.5) 27)  Allergic Rhinitis  (ICD-477.9) 28)  Peptic Ulcer Disease  (ICD-533.90) 29)  Family History Diabetes 1st Degree Relative  (ICD-V18.0) 30)  Asthma  (ICD-493.90) 31)  Anemia-nos  (ICD-285.9) 32)  Hyperlipidemia  (ICD-272.4) 33)  Hypothyroidism  (ICD-244.9) 34)  Colonic Polyps, Hx of  (ICD-V12.72) 35)  Chest Pain  (ICD-786.50) 36)  Varicella  (ICD-052.9)  Medications Prior to Update: 1)  Vicodin 5-500 Mg  Tabs (Hydrocodone-Acetaminophen) .Marland Kitchen.. 1 By Mouth Two Times A Day As Needed 2)  Atenolol 50 Mg  Tabs (Atenolol) .Marland Kitchen.. 1 By Mouth Once Daily 3)  Adult Aspirin Ec Low Strength 81 Mg  Tbec (Aspirin) .Marland Kitchen.. 1 By Mouth Qd 4)  Claritin 10 Mg  Tabs (Loratadine) .Marland Kitchen.. 1 By Mouth Once Daily As Needed 5)  Methocarbamol 500 Mg Tabs (Methocarbamol) .Marland Kitchen.. 1 Po Q 6 Hrs As Needed 6)  Cyanocobalamin 1000 Mcg/ml Soln (Cyanocobalamin) .Marland Kitchen.. 1 Cc Q Mo Im 7)  Calcium-Vitamin D 500-200 Mg-Unit Tabs (Calcium Carbonate-Vitamin D) .... 2po Two Times A Day 8)  Alendronate Sodium 70 Mg Tabs (Alendronate Sodium) .Marland Kitchen.. 1 By Mouth Q Wk 9)  Iron 325 (65 Fe) Mg Tabs (Ferrous Sulfate) .Marland Kitchen.. 1 By Mouth Once Daily 10)  Cod  Liver Oil  Caps (Cod Liver Oil) .Marland Kitchen.. 1 By Mouth Once Daily 11)  Vitamin C 500 Mg Tabs (Ascorbic Acid) .Marland Kitchen.. 1 By Mouth Once Daily  Current Medications (verified): 1)  Vicodin 5-500 Mg  Tabs (Hydrocodone-Acetaminophen) .Marland Kitchen.. 1 By Mouth Two Times A Day As Needed 2)  Atenolol 50 Mg  Tabs (Atenolol) .Marland Kitchen.. 1 By Mouth Once Daily 3)  Adult Aspirin Ec Low Strength 81 Mg  Tbec (Aspirin) .Marland Kitchen.. 1 By Mouth Qd 4)  Cetirizine Hcl 10 Mg Tabs (Cetirizine Hcl) .Marland Kitchen.. 1 By Mouth Once Daily 5)  Methocarbamol 500 Mg Tabs (Methocarbamol) .Marland Kitchen.. 1 Po Q 6 Hrs As Needed 6)  Cyanocobalamin 1000 Mcg/ml Soln (Cyanocobalamin) .Marland Kitchen.. 1 Cc Q Mo Im 7)  Calcium-Vitamin D 500-200 Mg-Unit Tabs (Calcium Carbonate-Vitamin D) .... 2po Two Times A Day 8)  Alendronate Sodium 70 Mg Tabs (Alendronate Sodium) .Marland Kitchen.. 1 By Mouth Q Wk 9)  Iron 325 (65 Fe) Mg Tabs (Ferrous Sulfate) .Marland Kitchen.. 1 By Mouth Once Daily 10)  Cod Liver Oil  Caps (Cod Liver Oil) .Marland Kitchen.. 1 By Mouth Once Daily 11)  Vitamin C 500 Mg Tabs (Ascorbic Acid) .Marland Kitchen.. 1 By Mouth Once Daily  Allergies (verified): 1)  ! 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[employer] Never Smoked Alcohol use-no Drug use-no  Risk Factors: Smoking Status: never (11/05/2007)  Past Medical History: Colonic polyps, hx of chronic headaches/chronic pain Hypothyroidism Hyperlipidemia Anemia-NOS DJD - spine, hands Asthma hearing loss sickle cell trait migraine Peptic ulcer disease Allergic rhinitis cervical spine DDD right CTS - severe - 8/08 emg/ncs hx of stenosing left thumb tenosynovitis left CTS - moderate 8/08 emg/ncs Hypertension Diabetes mellitus, type II - diet vit  d deficiency Peripheral neuropathy MD Roster:  Dr Raylene Everts - ortho                    Dr Elmer Picker - opthomology  Social History: Reviewed history from 11/05/2007 and no changes required. 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         all otherwise negative per pt -    Physical Exam  General:  alert and overweight-appearing.  Head:  normocephalic and atraumatic.   Eyes:  vision grossly intact, pupils equal, and pupils round.   Ears:  R ear normal and L ear normal.   Nose:  no external deformity and no nasal discharge.   Mouth:  no gingival abnormalities and pharynx pink and moist.   Neck:  supple and no masses.   Lungs:  normal respiratory effort and normal breath sounds.   Heart:  normal rate and regular rhythm.   Abdomen:  soft, non-tender, and normal bowel sounds.   Msk:  no joint tenderness and no joint swelling.   Extremities:  no edema, no erythema  Neurologic:  cranial nerves II-XII intact and strength normal in all extremities.  with decreased sensation to LT l5/s1distally  Skin:  color normal and no rashes.   Psych:  not depressed appearing and slightly anxious.     Impression & Recommendations:  Problem # 1:  Preventive Health Care (ICD-V70.0)  Overall doing well, age appropriate education and counseling updated and referral for appropriate preventive services done unless declined, immunizations up to date or declined, diet counseling done if  overweight, urged to quit smoking if smokes , most recent labs reviewed and current ordered if appropriate, ecg reviewed or declined (interpretation per ECG scanned in the EMR if done); information regarding Medicare Prevention requirements given if appropriate; speciality referrals updated as appropriate   Orders: First annual wellness visit with prevention plan  (X5284)  Problem # 2:  DIABETES MELLITUS, TYPE II (ICD-250.00)  Her updated medication list for this problem includes:    Adult Aspirin Ec Low Strength 81 Mg Tbec (Aspirin) .Marland Kitchen... 1 by mouth qd cont diet - to check a1c, o/w stable overall by hx and exam, ok to continue meds/tx as is  - no OHA needed at this time  Orders: TLB-A1C / Hgb A1C (Glycohemoglobin) (83036-A1C) TLB-Microalbumin/Creat Ratio, Urine (82043-MALB)  Problem # 3:  VITAMIN D DEFICIENCY (ICD-268.9)  Orders: T-Vitamin D (25-Hydroxy) (13244-01027)  cont supplement, re-check vit d level  Problem # 4:  PERIPHERAL NEUROPATHY (ICD-356.9) d/w pt - hx and exam suggestive, declines emg/ncs for now; did not like when she had this test for her CTS;  delcines elavil at hs for now; might re-consider if worsens; cont b12 replacement; doubt related to DM due to the minor degree of sugar control impairment; consider neuro consult - declines at this time  Problem # 5:  ALLERGIC RHINITIS (ICD-477.9)  Her updated medication list for this problem includes:    Cetirizine Hcl 10 Mg Tabs (Cetirizine hcl) .Marland Kitchen... 1 by mouth once daily treat as above, f/u any worsening signs or symptoms   Orders: Prescription Created Electronically [phone])  Problem # 6:  HYPERTENSION (ICD-401.9)  Her updated medication list for this problem includes:    Atenolol 50 Mg Tabs (Atenolol) .Marland Kitchen... 1 by mouth once daily  BP today: 124/72 Prior BP: 152/98 (07/23/2009)  Labs Reviewed: K+: 4.2 (05/11/2009) Creat: : 1.2 (05/11/2009)   Chol: 182 (05/11/2009)   HDL: 42.70 (05/11/2009)   LDL: 118  (05/11/2009)   TG: 106.0 (05/11/2009) stable overall by hx and exam, ok to continue meds/tx as is   Complete Medication List: 1)  Vicodin 5-500 Mg Tabs (Hydrocodone-acetaminophen) .Marland Kitchen.. 1 by mouth two times a day as needed 2)  Atenolol 50 Mg Tabs (Atenolol) .Marland Kitchen.. 1 by mouth once daily 3)  Adult Aspirin Ec Low Strength 81 Mg Tbec (Aspirin) .Marland Kitchen.. 1 by mouth qd 4)  Cetirizine Hcl 10 Mg Tabs (Cetirizine hcl) .Marland KitchenMarland KitchenMarland Kitchen  1 by mouth once daily 5)  Methocarbamol 500 Mg Tabs (Methocarbamol) .Marland Kitchen.. 1 po q 6 hrs as needed 6)  Cyanocobalamin 1000 Mcg/ml Soln (Cyanocobalamin) .Marland Kitchen.. 1 cc q mo im 7)  Calcium-vitamin D 500-200 Mg-unit Tabs (Calcium carbonate-vitamin d) .... 2po two times a day 8)  Alendronate Sodium 70 Mg Tabs (Alendronate sodium) .Marland Kitchen.. 1 by mouth q wk 9)  Iron 325 (65 Fe) Mg Tabs (Ferrous sulfate) .Marland Kitchen.. 1 by mouth once daily 10)  Cod Liver Oil Caps (Cod liver oil) .Marland Kitchen.. 1 by mouth once daily 11)  Vitamin C 500 Mg Tabs (Ascorbic acid) .Marland Kitchen.. 1 by mouth once daily  Other Orders: Pneumococcal Vaccine (98119) Admin 1st Vaccine (14782) TLB-Lipid Panel (80061-LIPID) TLB-BMP (Basic Metabolic Panel-BMET) (80048-METABOL) TLB-CBC Platelet - w/Differential (85025-CBCD) TLB-Hepatic/Liver Function Pnl (80076-HEPATIC) TLB-TSH (Thyroid Stimulating Hormone) (84443-TSH) TLB-Sedimentation Rate (ESR) (85652-ESR) TLB-IBC Pnl (Iron/FE;Transferrin) (83550-IBC) TLB-B12 + Folate Pnl (95621_30865-H84/ONG) TLB-Udip w/ Micro (81001-URINE)  Patient Instructions: 1)  you had the pneumonia shot today 2)  Please take all new medications as prescribed - the zyrtec 3)  Continue all previous medications as before this visit  4)  Please go to the Lab in the basement for your blood and/or urine tests today 5)  Please schedule a follow-up appointment in 6 months. Prescriptions: CETIRIZINE HCL 10 MG TABS (CETIRIZINE HCL) 1 by mouth once daily  #90 x 3   Entered and Authorized by:   Corwin Levins MD   Signed by:   Corwin Levins MD on  04/21/2010   Method used:   Electronically to        [facility] [phone]* (retail)       [address]       [place], [place]  [address]       Ph: [phone]       Fax: [phone]   RxID:   [id number] ATENOLOL 50 MG  TABS (ATENOLOL) 1 by mouth once daily  #90 x 3   Entered and Authorized by:   Corwin Levins MD   Signed by:   Corwin Levins MD on 04/21/2010   Method used:   Electronically to        [facility] [phone]* (retail)       [address]       [place], [place]  [address]       Ph: [phone]       Fax: [phone]   RxID:   [id number] VICODIN 5-500 MG  TABS (HYDROCODONE-ACETAMINOPHEN) 1 by mouth two times a day as needed  #60 x 1   Entered and Authorized by:   Corwin Levins MD   Signed by:   Corwin Levins MD on 04/21/2010   Method used:   Print then Give to Patient   RxID:   [id number]    Immunizations Administered:  Pneumonia Vaccine:    Vaccine Type: Pneumovax    Site: right deltoid    Mfr: Merck    Dose: 0.5 ml    Route: IM    Given by: Zella Ball Ewing    Exp. Date: 09/08/2011    Lot #: [id number]    VIS given: 06/11/96 version given April 21, 2010.

## 2010-12-16 NOTE — Letter (Signed)
Summary: [facility]   Imported By: Sherian Rein 12/31/2009 11:52:58  _____________________________________________________________________  External Attachment:    Type:   Image     Comment:   External Document

## 2010-12-16 NOTE — Miscellaneous (Signed)
Summary: Medication Issue/[facility]  Medication Issue/[facility]   Imported By: Sherian Rein 05/13/2010 10:57:02  _____________________________________________________________________  External Attachment:    Type:   Image     Comment:   External Document

## 2010-12-16 NOTE — Letter (Signed)
Summary: [facility]   Imported By: Sherian Rein 04/05/2010 08:41:22  _____________________________________________________________________  External Attachment:    Type:   Image     Comment:   External Document

## 2010-12-16 NOTE — Medication Information (Signed)
Summary: Nebulizer/[facility]  Nebulizer/[facility]   Imported By: Sherian Rein 04/05/2010 08:56:53  _____________________________________________________________________  External Attachment:    Type:   Image     Comment:   External Document

## 2010-12-16 NOTE — Medication Information (Signed)
Summary: Diabetes Testing Supplies/[facility]  Diabetes Testing Supplies/[facility]   Imported By: Sherian Rein 03/31/2010 13:56:42  _____________________________________________________________________  External Attachment:    Type:   Image     Comment:   External Document

## 2010-12-30 NOTE — Assessment & Plan Note (Signed)
Summary: 4 MO ROV /NWS  #   Vital Signs:  Patient profile:   70 year old female Height:      64 inches Weight:      225.25 pounds BMI:     38.80 O2 Sat:      97 % on Room air Temp:     98.2 degrees F oral Pulse rate:   68 / minute BP sitting:   130 / 78  (left arm) Cuff size:   large  Vitals Entered By: Zella Ball Ewing CMA (AAMA) (December 07, 2010 9:15 AM)  O2 Flow:  Room air  CC: 4 month ROV/RE   CC:  4 month ROV/RE.  History of Present Illness: here to f/u - overall doing ok;  Pt denies CP, worsening sob, doe, wheezing, orthopnea, pnd, worsening LE edema, palps, dizziness or syncope  Pt denies new neuro symptoms such as headache, facial or extremity weakness  Pt denies polydipsia, polyuria, or low sugar symptoms such as shakiness improved with eating.  Overall good compliance with meds, trying to follow low chol, DM diet, wt stable, little excercise however  Overall good compliance with meds, trying to follow low chol diet, wt stable, little excercise however .  No fever, wt loss, night sweats, loss of appetite or other constitutional symptoms  Overall good compliance with meds, and good tolerability.  Denies worsening depressive symptoms, suicidal ideation, or panic.  No falls since finished PT jan 12  Has recurring Knee pain fo which she will use 1/2 - 1 vicodin as needed occasionly only;   Due for b12 shot today.    Problems Prior to Update: 1)  Rash-nonvesicular  (ICD-782.1) 2)  Dysuria  (ICD-788.1) 3)  Diarrhea, Acute  (ICD-787.91) 4)  Preventive Health Care  (ICD-V70.0) 5)  Fatigue  (ICD-780.79) 6)  Peripheral Neuropathy  (ICD-356.9) 7)  Vitamin D Deficiency  (ICD-268.9) 8)  Disturbance of Skin Sensation  (ICD-782.0) 9)  Anemia, Iron Deficiency  (ICD-280.9) 10)  Fracture, Leg, Left  (ICD-827.0) 11)  Diabetes Mellitus, Type II  (ICD-250.00) 12)  Sciatica, Right  (ICD-724.3) 13)  Thrombocytopenia  (ICD-287.5) 14)  Other Specified Anemias  (ICD-285.8) 15)  Renal  Insufficiency  (ICD-588.9) 16)  Preoperative Examination  (ICD-V72.84) 17)  Memory Loss  (ICD-780.93) 18)  Cough  (ICD-786.2) 19)  Uri  (ICD-465.9) 20)  Hypertension  (ICD-401.9) 21)  Carpal Tunnel Syndrome, Hx of  (ICD-V12.49) 22)  Degenerative Disc Disease  (ICD-722.6) 23)  Obesity  (ICD-278.00) 24)  Sickle Cell Trait  (ICD-282.5) 25)  Hemorrhoids  (ICD-455.6) 26)  Rectal Bleeding  (ICD-569.3) 27)  Lateral Epicondylitis, Left  (ICD-726.32) 28)  Ganglion Cyst, Wrist, Left  (ICD-727.41) 29)  Arm Pain, Left  (ICD-729.5) 30)  Allergic Rhinitis  (ICD-477.9) 31)  Peptic Ulcer Disease  (ICD-533.90) 32)  Family History Diabetes 1st Degree Relative  (ICD-V18.0) 33)  Asthma  (ICD-493.90) 34)  Anemia-nos  (ICD-285.9) 35)  Hyperlipidemia  (ICD-272.4) 36)  Hypothyroidism  (ICD-244.9) 37)  Colonic Polyps, Hx of  (ICD-V12.72) 38)  Chest Pain  (ICD-786.50) 39)  Varicella  (ICD-052.9)  Medications Prior to Update: 1)  Vicodin 5-500 Mg  Tabs (Hydrocodone-Acetaminophen) .Marland Kitchen.. 1 By Mouth Two Times A Day As Needed 2)  Atenolol 50 Mg  Tabs (Atenolol) .Marland Kitchen.. 1 By Mouth Once Daily 3)  Adult Aspirin Ec Low Strength 81 Mg  Tbec (Aspirin) .Marland Kitchen.. 1 By Mouth Qd 4)  Cetirizine Hcl 10 Mg Tabs (Cetirizine Hcl) .Marland Kitchen.. 1 By Mouth Once Daily 5)  Cyanocobalamin 1000  Mcg/ml Soln (Cyanocobalamin) .Marland Kitchen.. 1 Cc Q Mo Im 6)  Calcium-Vitamin D 500-200 Mg-Unit Tabs (Calcium Carbonate-Vitamin D) .... 2po Two Times A Day 7)  Alendronate Sodium 70 Mg Tabs (Alendronate Sodium) .Marland Kitchen.. 1 By Mouth Q Wk 8)  Iron 325 (65 Fe) Mg Tabs (Ferrous Sulfate) .Marland Kitchen.. 1 By Mouth Once Daily 9)  Cod Liver Oil  Caps (Cod Liver Oil) .Marland Kitchen.. 1 By Mouth Once Daily 10)  Vitamin C 500 Mg Tabs (Ascorbic Acid) .Marland Kitchen.. 1 By Mouth Once Daily 11)  Lotrisone 1-0.05 % Crea (Clotrimazole-Betamethasone) .... Use Asd Two Times A Day As Needed 12)  Promethazine Hcl 25 Mg Tabs (Promethazine Hcl) .Marland Kitchen.. 1 By Mouth Q 6 Hrs As Needed Nausea  Current Medications (verified): 1)   Vicodin 5-500 Mg  Tabs (Hydrocodone-Acetaminophen) .Marland Kitchen.. 1 By Mouth Two Times A Day As Needed 2)  Atenolol 50 Mg  Tabs (Atenolol) .Marland Kitchen.. 1 By Mouth Once Daily 3)  Adult Aspirin Ec Low Strength 81 Mg  Tbec (Aspirin) .Marland Kitchen.. 1 By Mouth Qd 4)  Cetirizine Hcl 10 Mg Tabs (Cetirizine Hcl) .Marland Kitchen.. 1 By Mouth Once Daily 5)  Cyanocobalamin 1000 Mcg/ml Soln (Cyanocobalamin) .Marland Kitchen.. 1 Cc Q Mo Im 6)  Calcium-Vitamin D 500-200 Mg-Unit Tabs (Calcium Carbonate-Vitamin D) .... 2po Two Times A Day 7)  Alendronate Sodium 70 Mg Tabs (Alendronate Sodium) .Marland Kitchen.. 1 By Mouth Q Wk 8)  Iron 325 (65 Fe) Mg Tabs (Ferrous Sulfate) .Marland Kitchen.. 1 By Mouth Once Daily 9)  Cod Liver Oil  Caps (Cod Liver Oil) .Marland Kitchen.. 1 By Mouth Once Daily 10)  Vitamin C 500 Mg Tabs (Ascorbic Acid) .Marland Kitchen.. 1 By Mouth Once Daily 11)  Lotrisone 1-0.05 % Crea (Clotrimazole-Betamethasone) .... Use Asd Two Times A Day As Needed 12)  Promethazine Hcl 25 Mg Tabs (Promethazine Hcl) .Marland Kitchen.. 1 By Mouth Q 6 Hrs As Needed Nausea  Allergies (verified): 1)  ! * Naproxen  Past History:  Past Medical History: Last updated: 04/21/2010 Colonic polyps, hx of chronic headaches/chronic pain Hypothyroidism Hyperlipidemia Anemia-NOS DJD - spine, hands Asthma hearing loss sickle cell trait migraine Peptic ulcer disease Allergic rhinitis cervical spine DDD right CTS - severe - 8/08 emg/ncs hx of stenosing left thumb tenosynovitis left CTS - moderate 8/08 emg/ncs Hypertension Diabetes mellitus, type II - diet vit d deficiency Peripheral neuropathy MD Roster:  Dr Raylene Everts - ortho                    Dr Elmer Picker - opthomology  Past Surgical History: Last updated: 11/05/2007 s/p knee surgury Hysterectomy  Social History: Last updated: 04/21/2010 divorced 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   Head:  normocephalic and atraumatic.   Eyes:  vision grossly intact, pupils equal, and pupils round.   Ears:  R ear normal and L ear normal.   Nose:  no external deformity and no nasal discharge.   Mouth:  no gingival abnormalities and pharynx pink and moist.   Neck:  supple and no masses.   Lungs:  normal respiratory effort and normal breath sounds.   Heart:  normal rate and regular rhythm.   Abdomen:  soft, non-tender, and normal bowel sounds.   Msk:  no joint tenderness and no joint swelling.   Extremities:  no edema, no  erythema  Psych:  not depressed appearing and slightly anxious.     Impression & Recommendations:  Problem # 1:  HYPERTENSION (ICD-401.9)  Her updated medication list for this problem includes:    Atenolol 50 Mg Tabs (Atenolol) .Marland Kitchen... 1 by mouth once daily  BP today: 130/78 Prior BP: 122/68 (08/06/2010)  Labs Reviewed: K+: 4.4 (06/18/2010) Creat: : 1.35 (06/18/2010)   Chol: 191 (04/21/2010)   HDL: 46.80 (04/21/2010)   LDL: 109 (04/21/2010)   TG: 174.0 (04/21/2010) stable overall by hx and exam, ok to continue meds/tx as is   Problem # 2:  DIABETES MELLITUS, TYPE II (ICD-250.00)  Her updated medication list for this problem includes:    Adult Aspirin Ec Low Strength 81 Mg Tbec (Aspirin) .Marland Kitchen... 1 by mouth qd  Labs Reviewed: Creat: 1.35 (06/18/2010)    Reviewed HgBA1c results: 5.6 (04/21/2010)  5.6 (05/11/2009) stable overall by hx and exam, ok to continue meds/tx as is;  Pt to cont DM diet, excercise, wt control efforts; to check labs next visit- declines today  Problem # 3:  HYPERLIPIDEMIA (ICD-272.4)  Labs Reviewed: SGOT: 21 (04/21/2010)   SGPT: 15 (04/21/2010)   HDL:46.80 (04/21/2010), 42.70 (05/11/2009)  LDL:109 (04/21/2010), 118 (05/11/2009)  Chol:191 (04/21/2010), 182 (05/11/2009)  Trig:174.0 (04/21/2010), 106.0 (05/11/2009) stable overall by hx and exam, ok to continue meds/tx as is - Pt to  continue diet efforts, good med tolerance; to check labs next visit per pt preference - goal LDL less than 70   Problem # 4:  ASTHMA (ICD-493.90)  Pulmonary Functions Reviewed: O2 sat: 97 (12/07/2010) stable overall by hx and exam, ok to continue meds/tx as is - no meds needed at this time  Complete Medication List: 1)  Vicodin 5-500 Mg Tabs (Hydrocodone-acetaminophen) .Marland Kitchen.. 1 by mouth two times a day as needed 2)  Atenolol 50 Mg Tabs (Atenolol) .Marland Kitchen.. 1 by mouth once daily 3)  Adult Aspirin Ec Low Strength 81 Mg Tbec (Aspirin) .Marland Kitchen.. 1 by mouth qd 4)  Cetirizine Hcl 10 Mg Tabs (Cetirizine hcl) .Marland Kitchen.. 1 by mouth once daily 5)  Cyanocobalamin 1000 Mcg/ml Soln (Cyanocobalamin) .Marland Kitchen.. 1 cc q mo im 6)  Calcium-vitamin D 500-200 Mg-unit Tabs (Calcium carbonate-vitamin d) .... 2po two times a day 7)  Alendronate Sodium 70 Mg Tabs (Alendronate sodium) .Marland Kitchen.. 1 by mouth q wk 8)  Iron 325 (65 Fe) Mg Tabs (Ferrous sulfate) .Marland Kitchen.. 1 by mouth once daily 9)  Cod Liver Oil Caps (Cod liver oil) .Marland Kitchen.. 1 by mouth once daily 10)  Vitamin C 500 Mg Tabs (Ascorbic acid) .Marland Kitchen.. 1 by mouth once daily 11)  Lotrisone 1-0.05 % Crea (Clotrimazole-betamethasone) .... Use asd two times a day as needed 12)  Promethazine Hcl 25 Mg Tabs (Promethazine hcl) .Marland Kitchen.. 1 by mouth q 6 hrs as needed nausea  Other Orders: Vit B12 1000 mcg (J3420) Admin of Therapeutic Inj  intramuscular or subcutaneous (84132)  Patient Instructions: 1)  you had the B12 shot today 2)  please return every 1-2 months for the B12 shots with the nurse 3)  please call for your yearly mammogram 4)  Continue all previous medications as before this visit  5)  Your pain medication will be sent to [facility] on [address] 6)  Please schedule a follow-up appointment in 6 months. Prescriptions: VICODIN 5-500 MG  TABS (HYDROCODONE-ACETAMINOPHEN) 1 by mouth two times a day as needed  #60 x 1   Entered and Authorized by:   Corwin Levins MD   Signed by:  Corwin Levins MD on  12/07/2010   Method used:   Print then Give to Patient   RxID:   [id number]    Medication Administration  Injection # 1:    Medication: Vit B12 1000 mcg    Diagnosis: ANEMIA, IRON DEFICIENCY (ICD-280.9)    Route: IM    Site: L deltoid    Exp Date: 07/2012    Lot #: [id number]    Mfr: [employer]    Patient tolerated injection without complications    Given by: Zella Ball Ewing CMA (AAMA) (December 07, 2010 9:18 AM)  Orders Added: 1)  Vit B12 1000 mcg [J3420] 2)  Admin of Therapeutic Inj  intramuscular or subcutaneous [02725]

## 2011-01-28 LAB — CBC
HCT: 20.5 % — ABNORMAL LOW (ref 36.0–46.0)
HCT: 24.4 % — ABNORMAL LOW (ref 36.0–46.0)
HCT: 24.4 % — ABNORMAL LOW (ref 36.0–46.0)
HCT: 38.4 % (ref 36.0–46.0)
Hemoglobin: 7.2 g/dL — ABNORMAL LOW (ref 12.0–15.0)
Hemoglobin: 8.2 g/dL — ABNORMAL LOW (ref 12.0–15.0)
Hemoglobin: 8.4 g/dL — ABNORMAL LOW (ref 12.0–15.0)
Hemoglobin: 13 g/dL (ref 12.0–15.0)
MCH: 29.8 pg (ref 26.0–34.0)
MCH: 30.8 pg (ref 26.0–34.0)
MCHC: 33.7 g/dL (ref 30.0–36.0)
MCV: 88.8 fL (ref 78.0–100.0)
Platelets: 87 10*3/uL — ABNORMAL LOW (ref 150–400)
RBC: 2.3 MIL/uL — ABNORMAL LOW (ref 3.87–5.11)
RBC: 2.73 MIL/uL — ABNORMAL LOW (ref 3.87–5.11)
RBC: 2.77 MIL/uL — ABNORMAL LOW (ref 3.87–5.11)
RBC: 4.32 MIL/uL (ref 3.87–5.11)
WBC: 5.6 10*3/uL (ref 4.0–10.5)

## 2011-01-28 LAB — TYPE AND SCREEN: ABO/RH(D): O POS

## 2011-01-28 LAB — URINALYSIS, ROUTINE W REFLEX MICROSCOPIC
Bilirubin Urine: NEGATIVE
Nitrite: NEGATIVE
Specific Gravity, Urine: 1.015 (ref 1.005–1.030)
Urobilinogen, UA: 1 mg/dL (ref 0.0–1.0)

## 2011-01-28 LAB — DIFFERENTIAL
Eosinophils Relative: 2 % (ref 0–5)
Lymphocytes Relative: 33 % (ref 12–46)
Lymphs Abs: 1.9 10*3/uL (ref 0.7–4.0)
Monocytes Absolute: 0.5 10*3/uL (ref 0.1–1.0)
Monocytes Relative: 9 % (ref 3–12)

## 2011-01-28 LAB — BASIC METABOLIC PANEL
BUN: 21 mg/dL (ref 6–23)
CO2: 23 mEq/L (ref 19–32)
Calcium: 7.5 mg/dL — ABNORMAL LOW (ref 8.4–10.5)
Chloride: 107 mEq/L (ref 96–112)
Creatinine, Ser: 1.84 mg/dL — ABNORMAL HIGH (ref 0.4–1.2)
GFR calc Af Amer: 38 mL/min — ABNORMAL LOW (ref >60)
GFR calc Af Amer: 47 mL/min — ABNORMAL LOW (ref >60)
GFR calc non Af Amer: 27 mL/min — ABNORMAL LOW (ref >60)
GFR calc non Af Amer: 32 mL/min — ABNORMAL LOW (ref >60)
Glucose, Bld: 153 mg/dL — ABNORMAL HIGH (ref 70–99)
Potassium: 4.6 mEq/L (ref 3.5–5.1)
Potassium: 5.6 mEq/L — ABNORMAL HIGH (ref 3.5–5.1)
Potassium: 4.4 mEq/L (ref 3.5–5.1)
Sodium: 140 mEq/L (ref 135–145)

## 2011-01-28 LAB — URINE MICROSCOPIC-ADD ON

## 2011-01-28 LAB — HEMOGLOBIN AND HEMATOCRIT, BLOOD
HCT: 24 % — ABNORMAL LOW (ref 36.0–46.0)
Hemoglobin: 7.1 g/dL — ABNORMAL LOW (ref 12.0–15.0)
Hemoglobin: 8.3 g/dL — ABNORMAL LOW (ref 12.0–15.0)

## 2011-01-28 LAB — SURGICAL PCR SCREEN
MRSA, PCR: NEGATIVE
Staphylococcus aureus: NEGATIVE

## 2011-01-28 LAB — PROTIME-INR: Prothrombin Time: 13.7 seconds (ref 11.6–15.2)

## 2011-01-28 LAB — PREPARE RBC (CROSSMATCH)

## 2011-01-30 LAB — CBC
HCT: 23.1 % — ABNORMAL LOW (ref 36.0–46.0)
HCT: 28.5 % — ABNORMAL LOW (ref 36.0–46.0)
MCHC: 32.5 g/dL (ref 30.0–36.0)
MCHC: 33.3 g/dL (ref 30.0–36.0)
MCHC: 33.3 g/dL (ref 30.0–36.0)
MCV: 89.6 fL (ref 78.0–100.0)
MCV: 90.6 fL (ref 78.0–100.0)
Platelets: 113 10*3/uL — ABNORMAL LOW (ref 150–400)
Platelets: 69 10*3/uL — ABNORMAL LOW (ref 150–400)
Platelets: 79 10*3/uL — ABNORMAL LOW (ref 150–400)
RBC: 2.18 MIL/uL — ABNORMAL LOW (ref 3.87–5.11)
RDW: 13.4 % (ref 11.5–15.5)
RDW: 13.6 % (ref 11.5–15.5)
RDW: 14 % (ref 11.5–15.5)
WBC: 8.1 10*3/uL (ref 4.0–10.5)
WBC: 8.5 10*3/uL (ref 4.0–10.5)

## 2011-01-30 LAB — TYPE AND SCREEN: ABO/RH(D): O POS

## 2011-01-30 LAB — BASIC METABOLIC PANEL
BUN: 12 mg/dL (ref 6–23)
CO2: 26 mEq/L (ref 19–32)
Calcium: 8 mg/dL — ABNORMAL LOW (ref 8.4–10.5)
Creatinine, Ser: 1.31 mg/dL — ABNORMAL HIGH (ref 0.4–1.2)
Creatinine, Ser: 1.45 mg/dL — ABNORMAL HIGH (ref 0.4–1.2)
GFR calc non Af Amer: 40 mL/min — ABNORMAL LOW (ref >60)
Glucose, Bld: 110 mg/dL — ABNORMAL HIGH (ref 70–99)
Glucose, Bld: 128 mg/dL — ABNORMAL HIGH (ref 70–99)
Potassium: 3.8 mEq/L (ref 3.5–5.1)

## 2011-01-30 LAB — PROTIME-INR: INR: 1.1 (ref 0.00–1.49)

## 2011-01-30 LAB — APTT: aPTT: 33 seconds (ref 24–37)

## 2011-01-30 LAB — PREPARE RBC (CROSSMATCH)

## 2011-01-30 LAB — SURGICAL PCR SCREEN: MRSA, PCR: NEGATIVE

## 2011-02-24 LAB — BASIC METABOLIC PANEL
BUN: 12 mg/dL (ref 6–23)
CO2: 27 mEq/L (ref 19–32)
CO2: 28 mEq/L (ref 19–32)
Calcium: 8.5 mg/dL (ref 8.4–10.5)
Chloride: 105 mEq/L (ref 96–112)
Chloride: 107 mEq/L (ref 96–112)
GFR calc Af Amer: 56 mL/min — ABNORMAL LOW (ref >60)
GFR calc non Af Amer: 46 mL/min — ABNORMAL LOW (ref >60)
Glucose, Bld: 114 mg/dL — ABNORMAL HIGH (ref 70–99)
Glucose, Bld: 121 mg/dL — ABNORMAL HIGH (ref 70–99)
Glucose, Bld: 131 mg/dL — ABNORMAL HIGH (ref 70–99)
Potassium: 3.7 mEq/L (ref 3.5–5.1)
Potassium: 3.9 mEq/L (ref 3.5–5.1)
Potassium: 4 mEq/L (ref 3.5–5.1)
Sodium: 136 mEq/L (ref 135–145)
Sodium: 138 mEq/L (ref 135–145)
Sodium: 139 mEq/L (ref 135–145)

## 2011-02-24 LAB — TYPE AND SCREEN: ABO/RH(D): O POS

## 2011-02-24 LAB — URINALYSIS, ROUTINE W REFLEX MICROSCOPIC
Ketones, ur: NEGATIVE mg/dL
Nitrite: NEGATIVE
Protein, ur: NEGATIVE mg/dL
pH: 6.5 (ref 5.0–8.0)

## 2011-02-24 LAB — PROTIME-INR
Prothrombin Time: 13.8 seconds (ref 11.6–15.2)
Prothrombin Time: 23.8 seconds — ABNORMAL HIGH (ref 11.6–15.2)

## 2011-02-24 LAB — CBC
HCT: 25.5 % — ABNORMAL LOW (ref 36.0–46.0)
HCT: 29 % — ABNORMAL LOW (ref 36.0–46.0)
HCT: 31.3 % — ABNORMAL LOW (ref 36.0–46.0)
Hemoglobin: 10.5 g/dL — ABNORMAL LOW (ref 12.0–15.0)
Hemoglobin: 8.6 g/dL — ABNORMAL LOW (ref 12.0–15.0)
Hemoglobin: 9.6 g/dL — ABNORMAL LOW (ref 12.0–15.0)
MCHC: 33.7 g/dL (ref 30.0–36.0)
MCV: 88.5 fL (ref 78.0–100.0)
MCV: 90.2 fL (ref 78.0–100.0)
Platelets: 115 10*3/uL — ABNORMAL LOW (ref 150–400)
Platelets: 93 10*3/uL — ABNORMAL LOW (ref 150–400)
RBC: 2.88 MIL/uL — ABNORMAL LOW (ref 3.87–5.11)
RBC: 3.27 MIL/uL — ABNORMAL LOW (ref 3.87–5.11)
RDW: 14.2 % (ref 11.5–15.5)
RDW: 14.4 % (ref 11.5–15.5)
RDW: 14.4 % (ref 11.5–15.5)
RDW: 14.7 % (ref 11.5–15.5)

## 2011-02-24 LAB — POCT I-STAT, CHEM 8
Calcium, Ion: 1.2 mmol/L (ref 1.12–1.32)
Chloride: 106 mEq/L (ref 96–112)
HCT: 40 % (ref 36.0–46.0)
TCO2: 31 mmol/L (ref 0–100)

## 2011-02-24 LAB — DIFFERENTIAL
Basophils Absolute: 0.1 10*3/uL (ref 0.0–0.1)
Lymphocytes Relative: 15 % (ref 12–46)
Neutro Abs: 7.2 10*3/uL (ref 1.7–7.7)

## 2011-02-24 LAB — APTT: aPTT: 36 seconds (ref 24–37)

## 2011-02-24 LAB — GLUCOSE, CAPILLARY

## 2011-02-24 LAB — URIC ACID: Uric Acid, Serum: 2.9 mg/dL (ref 2.4–7.0)

## 2011-03-29 ENCOUNTER — Encounter: Payer: Self-pay | Admitting: Internal Medicine

## 2011-03-29 NOTE — H&P (Signed)
[name], [name] NO.:  [id number]   MEDICAL RECORD NO.:  [id number]          PATIENT TYPE:  INP   LOCATION:  1610                         FACILITY:  [facility]   PHYSICIAN:  Erasmo Leventhal, M.D.DATE OF BIRTH:  Mar 18, 1941   DATE OF ADMISSION:  01/16/2009  DATE OF DISCHARGE:                              HISTORY & PHYSICAL   CHIEF COMPLAINT:  Left leg tibia fracture.   HISTORY OF PRESENT ILLNESS:  This is a 70 year old lady with a history  of fall the a.m. of admission with inability to bear weight on her left  leg.  She came to the emergency room and was noted to have a  midshaft tibia fracture.  She was placed in a long-leg cast, but the  fracture was unable to be maintained in good position and alignment.  She was subsequently taken to the OR at this time for IM nailing of her  fracture.  The surgery, risks, benefits and aftercare were discussed in  detail with the patient.  Questions invited and answered.  Surgery to go  ahead as scheduled.   ALLERGIES:  NAPROXEN   CURRENT MEDICATIONS:  1. Atenolol 50 mg daily.  2. Allegra 180 mg daily.  3. Robaxin 500 mg 1 q.8 h. p.r.n. spasms.  4. Iron 325 mg 1 t.i.d.   SERIOUS MEDICAL ILLNESSES:  1. Hypertension.  2. Anemia.   PAST SURGERIES:  1. Right total knee arthroplasty.  2. Left distal femur ORIF.  3. Hysterectomy.  4. Herniorrhaphy.  5. Cataracts.   FAMILY HISTORY:  Positive for coronary artery disease, diabetes,  hypertension.   SOCIAL HISTORY:  The patient works as a Engineer, civil (consulting) at the [employer].  She  lives alone.  She does not smoke or drink.   REVIEW OF SYSTEMS:  CENTRAL NERVOUS SYSTEM:  Negative for headache,  blurred vision or dizziness.  PULMONARY: Positive for exertional  shortness of breath. Negative for PND and orthopnea.  CARDIOVASCULAR:  Negative for chest pain or palpitation.  GI:  Positive for history of  ulcer greater than 20 years ago.  No problems since.  GU:  Negative for  urinary tract difficulty.  MUSCULOSKELETAL:  Positive as in HPI.   PHYSICAL EXAMINATION:  VITAL SIGNS:  BP 124/65, respirations 16, pulse  72 and regular.  GENERAL APPEARANCE:  This is a well-developed, well-nourished lady in  moderate distress with her left leg.  HEENT:  Head normocephalic and atraumatic.  Pupils equal, round and  reactive to light.  Nose patent.  Throat without injection.  NECK:  Supple without adenopathy.  Carotids 2+ without bruit.  CHEST:  Clear to auscultation.  No rales or rhonchi.  Respirations 16.  No pain to AP or lateral chest compression.  HEART:  Regular rate and rhythm at 72 beats per minute without murmur.  ABDOMEN:  Soft with active bowel sounds.  No masses or organomegaly.  No  pain to AP or lateral pelvic compression.  NEUROLOGIC:  Patient alert and oriented to time, place and person.  Cranial nerves II-XII are grossly intact.  EXTREMITIES:  Upper extremities  show full range of motion in shoulders,  elbows, wrists and fingers without pain.  Lower-extremity, the right  lower extremity status post total knee with no problems.  The left leg  is swollen and tender in the midshaft tibia.  Neurovascular status is  intact.  Compartment signs are negative.  Compartments are soft.  Dorsalis pedis and posterior tibialis pulses are 2+ and capillary refill  is normal at 1 second.   DIAGNOSTICS:  X-rays show midshaft tibia and proximal fibular fracture.   IMPRESSION:  Midshaft tibia fracture on the left, unstable.   PLAN:  Admit and IM nail of the left tibia.      Jaquelyn Bitter. Chabon, P.A.    ______________________________  Erasmo Leventhal, M.D.    SJC/MEDQ  D:  01/20/2009  T:  01/20/2009  Job:  [id number]

## 2011-03-29 NOTE — Op Note (Signed)
[name]                  ACCOUNT NO.:  [id number]   MEDICAL RECORD NO.:  [id number]          PATIENT TYPE:  INP   LOCATION:  0003                         [facility]ACILITY[facility]  [facility]   PHYSICIAN:  [name], M.D.  DATE OF BIRTH:  August 01, 1941   DATE OF PROCEDURE:  10/14/2008  DATE OF DISCHARGE:                               OPERATIVE REPORT   PREOPERATIVE DIAGNOSIS:  Right knee osteoarthritis.   POSTOPERATIVE DIAGNOSIS:  Right knee osteoarthritis.   PROCEDURE:  Right total knee replacement   COMPONENTS USED:  DePuy rotating platform posterior stabilized knee  system with a size 3 femur, 3 tibia, 12.5 insert, and 41 patellar  button.   ASSISTANT:  Yetta Glassman. Marcellus Scott.   ANESTHESIA:  Duramorph spinal.   SPECIMEN:  None.   COMPLICATIONS:  None.   FINDINGS:  None.   DRAINS:  One medium Hemovac.   TOURNIQUET TIME:  40 minutes at 250 mmHg.   INDICATIONS FOR PROCEDURE:  [name] is a pleasant 70 year old female  with bilateral knee osteoarthritis.  She had a history of a  supracondylar left femur fracture.  After reviewing the fact that she  had failed conservative measures and wished to proceed with knee  arthroplasty, we suggested proceeding with the right knee first, getting  it as strong as possible to give her a base for which to tolerate her  left knee surgery.  The risks and benefits were discussed and consent  was obtained.   PROCEDURE IN DETAIL:  The patient was brought to the operative theater.  Once adequate anesthesia, preoperative antibiotics, and Ancef  administered, the patient was positioned supine with a proximal thigh  tourniquet placed.  The right lower extremity was pre-scrubbed and  prepped and draped in a sterile fashion.  The timeout was carried out,  identifying the extremity and the patient.   A midline incision was made followed by a median arthrotomy.  Following  initial exposure, attention was directed to the patella.  Precut  measurement  was 21 mm.  I resected down to 13-14 mm and chose to use a  41 patella button due to the surface area present.  A metal shim was  placed in the lug holes to protect the cut surface of the patella from  retractors and saw blades.   Attention was now directed to the distal femoral cut and the femoral  canal was opened with a drill irrigated to prevent fat emboli.  An  intramedullary rod was passed at 3 degrees of valgus.  I resected 10 mm  of bone off the distal femur.   Attention was now directed to tibia.  The tibia was subluxated  anteriorly and meniscus were removed.  An extramedullary guide was  placed and resected 10 mm of bone off the proximal lateral tibia.  Following debridement of the bone and further meniscus, I checked the  cut surface of the tibia to make sure it was perpendicular.  Once I was  satisfied that was, I went ahead and based the rotation of the femoral  cutting block based on the  cut surface of the tibia.  The rotation block  was pinned in position after I sized the femur to be a size 3.  With the  rotation block pins in place, I exchanged out for the 4-in-1 cutting  block with the 3 femur.  Anterior, posterior, and chamfer cuts were then  made without difficulty nor notching.   At this point, we checked and it showed that the 10 mm insert would  passed and it did.   A final box cut was made off the lateral aspect of the distal femur.  The tibia was now subluxated anteriorly and final preparation of the  tibia was carried out with drilling and keel punching the tibia through  the medial third of the tibial tubercle.  Trial reduction was now  carried out with a 3 femur, 3 tibia, and 10 insert.  The patella tracked  to the trochlea without application of pressure.   At this point, all trial components were removed.  We further debrided  the knee.  We used of a laminar spreader to remove some of bone  fragments and the soft tissues from the posterior aspect of  the knee,  particularly medially, and the synovial capsule layer was injected with  60 mL of 0.25% Marcaine with epinephrine and 1 mL of Toradol.   The cement was mixed, the final components opened.  The final components  were cemented into position.  I chose to use a 12.5 insert with the knee  coming to full extension and extruded cement was removed.  Once cement  cured, excessive cement was removed throughout the knee.  Once I was  certain that there were no loose fragments of cement that were able to  be visualized in the wound, I irrigated with the bulb syringe and then  placed a final 12.5 insert.   At this point, we used with the pulse lavage to further irrigate the  knee.  The tourniquet was let down at 40 minutes.  The medium Hemovac  drain was placed deep and reapproximated the extensor mechanism with 1-  Vicryl.  The remainder of the wound was closed with 2-0 Vicryl and  running 4-0 Monocryl.  The knee was cleaned, dried, and dressed  sterilely with Steri-Strips and bulky sterile wrap.  She was brought to  recovery room in stable condition.      Madlyn Frankel Charlann Boxer, M.D.  Electronically Signed     MDO/MEDQ  D:  10/14/2008  T:  10/14/2008  Job:  [id number]

## 2011-03-29 NOTE — H&P (Signed)
NAMETANIJAH, MORAIS                  ACCOUNT NO.:  [id number]   MEDICAL RECORD NO.:  [id number]          PATIENT TYPE:  INP   LOCATION:                               FACILITY:  [facility]   PHYSICIAN:  [name], M.D.  DATE OF BIRTH:  [birth year]   DATE OF ADMISSION:  10/14/2008  DATE OF DISCHARGE:                              HISTORY & PHYSICAL   PROCEDURE:  Right total knee replacement.   CHIEF COMPLAINT:  Right knee pain.   HISTORY OF PRESENT ILLNESS:  A 70 year old female with a history of  right knee pain secondary to osteoarthritis.  It has been refractory to  all conservative treatment.  Presurgical assessed prior to surgery.   PRIMARY CARE PHYSICIAN:  Dr. Oliver Barre with [facility].   Cardiology clearance provided by Dr. Willa Rough with [facility].   PAST MEDICAL HISTORY:  Significant for:   1. Osteoarthritis.  2. Hypertension.  3. Impaired hearing.  Wears hearing aids.  4. Thyroid disease.  5. Anemia in 2006 with transfusion.  6. Degenerative disk disease.   PAST SURGICAL HISTORY:  Hysterectomy in 1990.   FAMILY HISTORY:  Cancer.   SOCIAL HISTORY:  She is a Engineer, civil (consulting).  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   DRUG ALLERGIES:  No known drug allergies.   CURRENT MEDICATIONS:  1. Atenolol 50 mg p.o. daily.  2. Calcium tab 1 p.o. b.i.d.  3. Vitamin D 400 mg b.i.d.  4. Vicodin 5/500 one p.o. q.6 p.r.n. pain.  5. Aspirin 81 mg p.o. daily.  6. Multivitamin 1 p.o. daily.  7. Vitamin C p.o. daily.  8. B12, B6 injection q. monthly.  9. Levothyroxine 50 mcg 1 p.o. daily.  Verify with patient.  This was      on her primary care visit.  However, not mentioned in history and      physical.   REVIEW OF SYSTEMS:  HEENT:  She does have hearing loss.  RESPIRATORY:  She has shortness of breath on exertion.  GASTROINTESTINAL:  She has  constipation.  GENITOURINARY:  She has increased urinating at night.  MUSCULOSKELETAL:  Shoulders and  arms, left and right hands, fingers do  get stiff.  She has multiple joint pains, muscular weakness as well as  morning stiffness.  Otherwise, see HPI.   PHYSICAL EXAMINATION:  Pulse 64, respirations 16, blood pressure 130/76.  GENERAL:  Awake, alert,and oriented, well-developed, well-nourished, in  no acute distress.  HEENT:  Normocephalic.  NECK:  Supple.  No carotid bruits.  CHEST/LUNGS:  Clear to auscultation bilaterally.  HEART:  Regular rate and rhythm.  S1, S2 distinct.  ABDOMEN:  Soft, nontender, bowel sounds present.  GENITOURINARY:  Deferred.  PELVIS:  Stable.  EXTREMITIES:  Right knee anterior pain with dorsalis pedis pulse  positive.  SKIN:  No cellulitis.  NEUROLOGIC:  Intact distal sensibilities.   LABORATORY DATA:  Labs, EKG, chest x-ray all pending presurgical  testing.   IMPRESSION:  Right knee osteoarthritis.   PLAN OF ACTION:  Right total knee replacement  at [facility]  October 14, 2008 by Dr. Lajoyce Corners.  Risks and complications were  discussed.   Postoperative medications including Lovenox, Robaxin, iron, aspirin,  Colace, and MiraLax were provided at the time of history and physical.  Pain medicine to be dispensed at time of surgery.     ______________________________  Yetta Glassman [address], [place]      [name], M.D.  Electronically Signed    BLM/MEDQ  D:  10/01/2008  T:  10/02/2008  Job:  [id number]   cc:   Corwin Levins, MD  [address]  [place]  [place] [address]   Luis Abed, MD, [facility]  [address]  [place]  [place] [address]

## 2011-03-29 NOTE — Assessment & Plan Note (Signed)
[facility]                         GASTROENTEROLOGY OFFICE NOTE   KARI, MONTERO                         MRN:          [id number]  DATE:07/25/2007                            DOB:          09-May-1941    REASON FOR REFERRAL:  Dr.  Jonny Ruiz asked me to evaluate Ms. Hand in  consultation regarding rectal bleeding and vomiting.   HISTORY OF PRESENT ILLNESS:  Ms. Caison is a pleasant 70 year old woman  who has had many years of nausea and diarrhea. She describes of extreme  nausea and vomiting and diarrhea. These were happening approximately  once a month. In between these episodes she would be completely normal  and not have nausea and would move her bowels normally. More recently,  they have been happening at least once a week or so. She has noticed  bright red blood per rectum and some blood in her vomitus too. She had  colonoscopy and upper endoscopy by Dr.  Ritta Slot (I believe this is  whom she saw, she is not exactly clear on who it was, but thinks that  this name rings a bell). She tells me she had polyps removed and she was  told to have a followup one year afterwards. She has been getting  multiple letters from the doctor's office and has declined to have  further examination. She was recently by Dr.  Oliver Barre who did lab  tests and put her on over-the-counter Prilosec which she has been taking  after her breakfast meal. She said since starting that Prilosec she has  definitely noticed a difference in her nausea and vomiting. Lab tests  one month ago showed a hemoglobin of 10.4 and MCV of 112, platelets of  132. Normal white count. Normal INR. Creatinine of 1.4, otherwise normal  liver tests. Her TSH was slightly elevated at 5.8.   REVIEW OF SYSTEMS:  Noted for stable weight. Is otherwise essentially  normal and is available on her nursing intake sheet.   PAST MEDICAL HISTORY:  1. Chronic headaches, status post knee surgery.  2. Chronic pain  for which she takes Naprosyn two pills a day every day      for at least 2-3 years.  3. Colon polyps, see above.   CURRENT MEDICATIONS:  1. Synthroid.  2. Prilosec.  3. Naprosyn.  4. Ultram.  5. Calcium.  6. Vitamin D.   ALLERGIES:  No known drug allergies.   SOCIAL HISTORY:  Divorced and has three children. Works as a Engineer, civil (consulting) at  [employer]. Nonsmoker and nondrinker. Drinks two caffeinated  beverages a day.   FAMILY HISTORY:  No colon cancer, colon polyps in family.   PHYSICAL EXAMINATION:  Height is 5 feet, 4 inches; 241 pounds. Blood  pressure 142/72, pulse 72. Calculated BMI of 41, which is Class III  obese.  CONSTITUTIONAL: Obese, otherwise well-appearing.  NEUROLOGIC: Alert and oriented x3.  EYES: Extraocular movements intact.  MOUTH: Oropharynx moist. No lesions.  NECK: Supple. No lymphadenopathy.  CARDIOVASCULAR: HEART: Regular rate and rhythm.  LUNGS:  Clear to auscultation bilaterally.  ABDOMEN: Soft and  nontender, nondistended. Normal bowel sounds.  EXTREMITIES: No lower extremity edema.  SKIN: No rashes or lesions on visible extremities.   ASSESSMENT/PLAN:  A 70 year old woman with episodes of nausea, vomiting  and diarrhea with minor bleeding in vomit and minor bleeding rectally.   First, we will work together to get the records from Dr.  Jennye Boroughs  office sent over. She was told to have a followup colonoscopy one year  after her initial colonoscopy which is somewhat concerning to me. I do  not see any pathology in E-chart system, but that is a relatively quick  interval and there may have been significant pathology. She has been  ignoring requests from Dr.  Jennye Boroughs office for repeat examinations. I  think we should definitely proceed with repeat colonoscopy and an upper  endoscopy at her soonest convenience. In the meantime, I have  recommended that she stay on the Prilosec, although I think it is best  to be taking 20-30 minutes prior to her  breakfast meals as that is the  way the pill is designed to work most effectively.     Rachael Fee, MD  Electronically Signed    DPJ/MedQ  DD: 07/25/2007  DT: 07/25/2007  Job #: [id number]   cc:   Corwin Levins, MD

## 2011-03-29 NOTE — Discharge Summary (Signed)
NAMEALAYJAH, BOEHRINGER NO.:  [id number]   MEDICAL RECORD NO.:  [id number]          PATIENT TYPE:  INP   LOCATION:  1610                         FACILITY:  [facility]   PHYSICIAN:  Erasmo Leventhal, M.D.DATE OF BIRTH:  1941/11/13   DATE OF ADMISSION:  01/16/2009  DATE OF DISCHARGE:                               DISCHARGE SUMMARY   ADMITTING DIAGNOSIS:  Closed tibia-fibula fracture left leg.   DISCHARGE DIAGNOSES:  1. Closed tibia-fibula fracture left leg.  2. Postoperative thrombocytopenia.   OPERATION:  IM nailing left tib-fib fracture.   COMPLICATIONS:  None.   TRANSFUSIONS:  Two units packed cells.   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  She was brought to  [facility] emergency room where x-ray showed a tib-fib fracture on the  left.  Attempted closed casting was undertaken.  However, the fracture  could not be maintained and subsequently the patient was taken to the  operating room for IM nailing of her tibia fracture.  The surgery risks,  benefits and aftercare were discussed in detail with the patient.   LABORATORY VALUES:  Admission CBC showed hemoglobin 11.9, hematocrit  35.7, platelets low at 115.  Admission Chem-8 showed glucose high at  121, creatinine slightly high at 1.4.  Urinalysis was within normal  limits.  Admission PT/PTT within normal limits.  Postoperatively, the  patient was placed on Lovenox for DVT prophylaxis.  However, her  platelets dropped to 90.  She had done this on a previous  hospitalization for an ORIF of the distal femur fracture and the Lovenox  was discontinued and she was switched to [name] and her platelet count  came back up to 93.  Her creatinine returned to normal and she just ran  mildly elevated glucose throughout admission.  Uric acid was 2.9 serum.  On January 20, 2009, her INR was therapeutic at 2.0.   COURSE IN THE HOSPITAL:  The patient tolerated the operative procedure  well.  The first postoperative day, her vital signs were stable.  She  was afebrile.  Platelets were decreased to 98,000, hemoglobin 9.6,  hematocrit 29.  Neurovascular status intact in the leg negative,  compartment signs.  Dressing was clean and dry and the patient was  started in therapy.  With her platelets decreased, she was discontinued  on her Lovenox and [name] was started.  The second postoperative day,  her vital signs were stable.  Compartments were soft.  Neurovascular  status intact.  Her dressing was changed.  Wound was benign.  Hemoglobin/hematocrit were in the 8.6-25.5 range, and she was  subsequently transfused 2 units of packed cells.  Due to the patient  living at home a rehab consult was obtained.  However, the patient was  not felt to be a candidate for the rehab admission and subsequently a  skilled nursing facility was recommended and that was what the patient  wanted and  efforts were being made to find this.  The third  postoperative day, she was feeling better, vital signs stable, afebrile.  Is and Os were good.  Hemoglobin 10.5, hematocrit 31.3, platelets  93,000, INR 1.5.  Lungs were clear.  Heart sounds were normal.  Bowel  sounds were active.  Calves were negative.  Compartments were soft.  Neurovascular status intact.  The wound was benign.  Continued search  for nursing home placement was undertaken.  The fourth postoperative  day, she was feeling better.  She had, had some pain in her ankle and  foot the day before.  There was question of gout, though there was no  history of gout.  Serum uric acid was normal and on this day the  swelling in the ankle and foot was decreased and pain was markedly  decreased.  Her vital signs were stable.  She was afebrile.  INR was  therapeutic at 2.0.  Lungs clear.  Heart sounds normal.  Bowel sounds   active.  Calves were negative.  Wound was benign, and the patient is now  ready for discharge to the nursing facility when one is available.   CONDITION ON DISCHARGE:  Improved.   DISCHARGE MEDICATIONS:  1. [name] 50 mg 1 daily.  2. [name] 180 mg 1 daily.  3. [name] 500 mg 1 p.o. q.8 h., p.r.n. spasm.  4. [name] 325 mg 1 p.o. t.i.d.  5. [name] 5/325 mg 1-2 q.6 h., p.r.n. pain.  6. [name] 1000 mcg IM injection once monthly.  7. [name] per pharmacy protocol to maintain INR between 2-3.  8. Laxative of choice p.r.n.   Weightbearing status will be touchdown weightbearing with walker.  Discharge diet 60 gram modified carb diet.  Wound care; dry dressing  daily to both incision sites.  She should ice the mid tibial region  p.r.n. for comfort and elevate the leg when not ambulating.  Encourage  ankle pumps and straight leg raise.  She will need physical therapy  daily during the weekdays for range of motion to the knee and ankle and  ambulation daily with touchdown weightbearing with walker.  She will  need to be on her [name] for a total of 3 weeks postoperatively, so  [name] will be discontinued on February 06, 2009.  She will need every  other day PT/INR levels to maintain her [name] between the therapeutic  range of INR 2.0-3.0.  Follow up with Dr. Thomasena Edis in 10 days.  Please  call the office today to make an appointment at [phone], and call  sooner p.r.n. problems.      Jaquelyn Bitter. Chabon, P.A.    ______________________________  Erasmo Leventhal, M.D.    SJC/MEDQ  D:  01/20/2009  T:  01/20/2009  Job:  [id number]

## 2011-03-29 NOTE — Op Note (Signed)
NAMEJOLISA, [name] NO.:  [id number]   MEDICAL RECORD NO.:  [id number]          PATIENT TYPE:  INP   LOCATION:  1610                         FACILITY:  [facility]   PHYSICIAN:  Erasmo Leventhal, M.D.DATE OF BIRTH:  Apr 15, 1941   DATE OF PROCEDURE:  01/16/2009  DATE OF DISCHARGE:                               OPERATIVE REPORT   TIME:  5:30 p.m.   HISTORY:  [name] fell today and sustained a left closed tibia and  fibular diaphyseal fracture.  In the ED, it was found to be unstable.  Treatment options were discussed in detail.  We described surgical  intervention and stabilization. She understood the risks and benefits  including the possibility of but not limited to neurovascular damage,  DVT, thromboembolism, delayed union, malunion, nonunion, infection,  implant failure, and etc.   PREOPERATIVE DIAGNOSES:  Left closed unstable tibia and fibular  diaphyseal fracture.   POSTOPERATIVE DIAGNOSES:  Left closed unstable tibia and fibular  diaphyseal fracture.   PROCEDURE:  Closed reduction and intramedullary nailing of left tibial  fracture, static interlock.  C-arm radiography.   SURGEON:  Erasmo Leventhal, MD.   ASSISTANT:  Jaquelyn Bitter. Chabon, PA-C.   ANESTHESIA:  General.   ESTIMATED BLOOD LOSS:  Less than 200 mL.   DRAINS:  None.   COMPLICATIONS:  None.   TOURNIQUET TIME:  16 minutes at 300 mmHg.   DISPOSITION:  PACU stable.   OPERATIVE DETAILS:  The patient was counseled in the holding area.  Taken to the operating room where she was placed under a general  anesthesia.  At this point, the surgery cast was removed.  The skin was  intact, elevated, and carefully prepped with DuraPrep and draped in a  sterile fashion.  At this time, the Esmarch was inflated to 300 mmHg.  An incision was made at the patellar tendon region through the skin and  subcutaneous tissue and also making sure this would be where she would  undergo a total knee  replacement in the future.  The center aspect was  identified with a guide pin.  The patellar tendon was split in line with  the tibia midline.  The guide pin was placed in excellent position,  drilled into place and a safety reamer was utilized.  The awl was then  utilized.  A guide pin was placed from proximal to distal and again  across the fracture site after reducing it and giving excellent fixation  in the mid portion of the distal tibial metaphyseal region.  The  tourniquet was deflated at this point in time.  A sequential reaming was  done up to 14 mm.  We had initially reamed to a 13, we reamed 2 mm over,  and measured the size of the nail, and then reimplanted an ACE DePuy  titanium nail.  This was taken across the fracture site confirmed in the  AP and lateral planes with excellent fixation and placement.   Oblique interlocking screws were placed proximally utilizing the C-arm.   Distally, I then again rechecked the AP and lateral  planes and found  excellent alignment, rotation, and positioning.  Distally utilizing the  Perfect technique, 2 additional locking screws were placed.  We now had  a well static interlock nail proximal and distal.  The wounds were  copiously irrigated.   The proximal patellar tendon incision was closed with a Vicryl suture.  The subcu Vicryl, the skin was closed with staples.  Distally, the small  puncture wounds were closed with staples, also.  At this point in time,  we had excellent alignment, orientation, her compartments were soft,  excellent circulation to the foot and ankle.  A sterile dressing was  applied.  We also infiltrated the wound edges with 10 mL of 0.25%  Marcaine with epinephrine.  She was placed into a compressive dressing,  ice pack, and knee immobilizer in full extension.  She tolerated the  procedure well and she was taken from the operating room to the PACU in  stable condition.   Help with surgical technique and decision  making, Mr. Leilani Able, PA-  C, assistant, who was there throughout the entire case.           ______________________________  Erasmo Leventhal, M.D.     RAC/MEDQ  D:  01/16/2009  T:  01/17/2009  Job:  [id number]

## 2011-03-29 NOTE — Discharge Summary (Signed)
[name], [name]                  ACCOUNT NO.:  [id number]   MEDICAL RECORD NO.:  [id number]          PATIENT TYPE:  INP   LOCATION:  1604                         FACILITY:  [facility]   PHYSICIAN:  [name], M.D.  DATE OF BIRTH:  06-Jul-1941   DATE OF ADMISSION:  10/14/2008  DATE OF DISCHARGE:  10/17/2008                               DISCHARGE SUMMARY   ADMITTING DIAGNOSES:  1. Osteoarthritis.  2. Hypertension.  3. Sickle cell trait.  4. Thyroid disease.  5. Degenerative disk disease.   DISCHARGE DIAGNOSES:  1. Osteoarthritis.  2. Hypertension.  3. Sickle cell trait.  4. Thyroid disease.  5. Degenerative disk disease.  6. Acute blood loss anemia.   HISTORY OF PRESENT ILLNESS:  A 70 year old female with a history of  right knee pain secondary to osteoarthritis, refractory to all  conservative treatment.   CONSULTS:  None.   PROCEDURE:  Right total knee replacement by surgeon Dr. Durene Romans,  assistant Dwyane Luo, PA-C.   LABORATORY DATA:  CBC final readings after transfusion in the hospital:  White blood cell 10.5, hemoglobin 10.2, hematocrit 30.8 and platelets  79.  White cell differential all within normal limits.  Coags normal.  Metabolic panel final reading:  Sodium 141, potassium 4.6, glucose 132.  Final GFR reading was 27 and the calcium was 8.3.  UA was negative.   RADIOLOGY:  Chest two-view:  No acute findings.   CARDIOLOGY:  Had full cardiology workup before admission.   HOSPITAL COURSE:  The patient underwent a right total knee replacement  and admitted to the orthopedic floor.  She remained orthopedically  stable throughout her course of stay.  She did have acute blood loss  anemia, was transfused 2 units of blood which resolved.  Physical  therapy:  She made good progress during the course of stay.  The  dressing was changed.  Wound had no active drainage.  Seen on day three,  doing okay.  She was afebrile, hemodynamically stable, right knee  dressing  was dry, she was moving her quads okay.  She was stable and  ready for discharge home.   DISCHARGE DISPOSITION:  Discharged home in stable and improved condition  with home health care PT selected.   DISCHARGE DIET:  Low-sodium, heart-healthy.   WOUND CARE:  Keep dry.   DISCHARGE PHYSICAL THERAPY:  Weightbearing as tolerated with use of  rolling walker.   DISCHARGE MEDICATIONS:  1. Aspirin 325 mg p.o. b.i.d. times 6 weeks.  2. Robaxin 500 mg q.6.  3. Colace 100 mg b.i.d.  4. MiraLAX 17 grams daily.  5. Iron 325 mg t.i.d.  6. Norco 7.5/325 one to two p.o. q.4 - 6 p.r.n. pain.  7. Allegra 180 mg p.o. q.a.m.  8. Atenolol 50 mg p.o. q.a.m., hold until systolic pressure improves.  9. Zantac 150 mg p.r.n.  10.Vitamin B12 - 100 mcg daily.  11.Calcium vitamin D b.i.d.  12.Multivitamin daily.  13.Synthroid 50 mcg q.a.m.  14.Celebrex 200 mg p.o. b.i.d.    We did hold the Celebrex for 5 days and restarted b.i.d.  DISCHARGE FOLLOWUP:  Follow up with Dr. Charlann Benson at phone number [phone].     ______________________________  [name]. [name], [name]      [name], M.D.  Electronically Signed    BLM/MEDQ  D:  11/04/2008  T:  11/04/2008  Job:  [id number]   cc:   Corwin Levins, MD  [address]  [place]  [place] [address]   Luis Abed, MD, [facility]  [address]  [address]  [place]  [place] [address]
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
## 2011-05-17 ENCOUNTER — Encounter: Payer: Self-pay | Admitting: Internal Medicine

## 2011-06-03 ENCOUNTER — Encounter: Payer: Self-pay | Admitting: Internal Medicine

## 2011-06-03 DIAGNOSIS — Z0001 Encounter for general adult medical examination with abnormal findings: Secondary | ICD-10-CM | POA: Insufficient documentation

## 2011-06-07 ENCOUNTER — Encounter: Payer: Self-pay | Admitting: Internal Medicine

## 2011-06-07 ENCOUNTER — Other Ambulatory Visit (INDEPENDENT_AMBULATORY_CARE_PROVIDER_SITE_OTHER): Payer: Medicare Other

## 2011-06-07 ENCOUNTER — Ambulatory Visit (INDEPENDENT_AMBULATORY_CARE_PROVIDER_SITE_OTHER): Payer: Medicare Other | Admitting: Internal Medicine

## 2011-06-07 VITALS — BP 108/70 | HR 58 | Temp 98.7°F | Ht 65.0 in | Wt 231.2 lb

## 2011-06-07 DIAGNOSIS — E785 Hyperlipidemia, unspecified: Secondary | ICD-10-CM

## 2011-06-07 DIAGNOSIS — M5030 Other cervical disc degeneration, unspecified cervical region: Secondary | ICD-10-CM

## 2011-06-07 DIAGNOSIS — G8929 Other chronic pain: Secondary | ICD-10-CM

## 2011-06-07 DIAGNOSIS — I10 Essential (primary) hypertension: Secondary | ICD-10-CM

## 2011-06-07 DIAGNOSIS — E119 Type 2 diabetes mellitus without complications: Secondary | ICD-10-CM

## 2011-06-07 DIAGNOSIS — M949 Disorder of cartilage, unspecified: Secondary | ICD-10-CM

## 2011-06-07 DIAGNOSIS — D509 Iron deficiency anemia, unspecified: Secondary | ICD-10-CM

## 2011-06-07 DIAGNOSIS — R5381 Other malaise: Secondary | ICD-10-CM

## 2011-06-07 DIAGNOSIS — R5383 Other fatigue: Secondary | ICD-10-CM

## 2011-06-07 DIAGNOSIS — Z0000 Encounter for general adult medical examination without abnormal findings: Secondary | ICD-10-CM

## 2011-06-07 DIAGNOSIS — M8580 Other specified disorders of bone density and structure, unspecified site: Secondary | ICD-10-CM | POA: Insufficient documentation

## 2011-06-07 HISTORY — DX: Other cervical disc degeneration, unspecified cervical region: M50.30

## 2011-06-07 HISTORY — DX: Other chronic pain: G89.29

## 2011-06-07 LAB — CBC WITH DIFFERENTIAL/PLATELET
Basophils Absolute: 0 10*3/uL (ref 0.0–0.1)
Eosinophils Absolute: 0 10*3/uL (ref 0.0–0.7)
Lymphocytes Relative: 28.3 % (ref 12.0–46.0)
MCHC: 33.8 g/dL (ref 30.0–36.0)
MCV: 92.2 fl (ref 78.0–100.0)
Monocytes Absolute: 0.5 10*3/uL (ref 0.1–1.0)
Neutrophils Relative %: 61.8 % (ref 43.0–77.0)
Platelets: 82 10*3/uL — ABNORMAL LOW (ref 150.0–400.0)
RBC: 4.21 Mil/uL (ref 3.87–5.11)
RDW: 13.4 % (ref 11.5–14.6)

## 2011-06-07 LAB — HEPATIC FUNCTION PANEL
Alkaline Phosphatase: 95 U/L (ref 39–117)
Bilirubin, Direct: 0.1 mg/dL (ref 0.0–0.3)
Total Bilirubin: 0.7 mg/dL (ref 0.3–1.2)
Total Protein: 8.9 g/dL — ABNORMAL HIGH (ref 6.0–8.3)

## 2011-06-07 LAB — BASIC METABOLIC PANEL
BUN: 17 mg/dL (ref 6–23)
CO2: 29 mEq/L (ref 19–32)
Chloride: 110 mEq/L (ref 96–112)
Creatinine, Ser: 1.4 mg/dL — ABNORMAL HIGH (ref 0.4–1.2)
Potassium: 4.7 mEq/L (ref 3.5–5.1)

## 2011-06-07 LAB — URINALYSIS, ROUTINE W REFLEX MICROSCOPIC
Nitrite: NEGATIVE
Total Protein, Urine: NEGATIVE
Urine Glucose: NEGATIVE
pH: 6 (ref 5.0–8.0)

## 2011-06-07 LAB — LIPID PANEL
HDL: 51.9 mg/dL (ref >39.00)
LDL Cholesterol: 113 mg/dL — ABNORMAL HIGH (ref 0–99)
VLDL: 20.2 mg/dL (ref 0.0–40.0)

## 2011-06-07 LAB — HEMOGLOBIN A1C: Hgb A1c MFr Bld: 6.1 % (ref 4.6–6.5)

## 2011-06-07 MED ORDER — CYANOCOBALAMIN 1000 MCG/ML IJ SOLN
1000.0000 ug | Freq: Once | INTRAMUSCULAR | Status: AC
  Administered 2011-06-07: 1000 ug via INTRAMUSCULAR

## 2011-06-07 MED ORDER — HYDROCODONE-ACETAMINOPHEN 5-500 MG PO TABS: 1.0000 | ORAL_TABLET | Freq: Two times a day (BID) | ORAL | Status: DC | PRN

## 2011-06-07 NOTE — Assessment & Plan Note (Signed)
Etiology unclear, Exam otherwise benign, to check labs as documented, follow with expectant management  

## 2011-06-07 NOTE — Progress Notes (Signed)
Subjective:    Patient ID: [name], female    DOB: 07/11/1941, 70 y.o.   MRN: [id number]  HPI  Here for f/u;  Overall doing ok;  Pt denies CP, worsening SOB, DOE, wheezing, orthopnea, PND, worsening LE edema, palpitations, dizziness or syncope.  Pt denies neurological change such as new Headache, facial or extremity weakness.  Pt denies polydipsia, polyuria, or low sugar symptoms. Pt states overall good compliance with treatment and medications, good tolerability, and trying to follow lower cholesterol diet.  Pt denies worsening depressive symptoms, suicidal ideation or panic. No fever, wt loss, night sweats, loss of appetite, or other constitutional symptoms.  Pt states good ability with ADL's, low fall risk, home safety reviewed and adequate, no significant changes in hearing or vision, and occasionally active with exercise.  Has signficant financial constraints and unable to afford the fosamax.  Also with ongoing neck pain ongoing for more than 6 mo, worse in the past month with pain to both UE's worse to sleep on left or right side as pain tends to get worse.  Has had ESI to neck 2006 with some improvement, but declines further ortho at this time due to cost.  Heating pad seems to help.  Due for dxa now. Does have sense of ongoing fatigue, but denies signficant hypersomnolence.  Past Medical History  Diagnosis Date  . Hx of colonic polyps   . Chronic headaches     Chronic pain  . Hypothyroidism   . Hyperlipidemia   . Anemia     NOS  . DJD (degenerative joint disease)     Spine, hands  . Asthma   . Hearing loss   . Sickle cell trait   . Migraine   . Peptic ulcer disease   . Allergic rhinitis   . DDD (degenerative disc disease), cervical   . CTS (carpal tunnel syndrome) 8/08    Right, severe, emg/ncs; Left, moderate, emg/ncs  . Stenosing tenosynovitis of thumb     Left  . Hypertension   . Diabetes mellitus     Type II-diet  . Vitamin D deficiency   . Peripheral neuropathy   .  Disc disease, degenerative, cervical 06/07/2011  . Chronic neck pain 06/07/2011   Past Surgical History  Procedure Date  . Knee surgery   . Abdominal hysterectomy     reports that she has never smoked. She does not have any smokeless tobacco history on file. She reports that she does not drink alcohol or use illicit drugs. family history includes Arthritis in her other; Diabetes in her mother and other; and Hyperlipidemia in her other. Allergies  Allergen Reactions  . Naproxen    Current Outpatient Prescriptions on File Prior to Visit  Medication Sig Dispense Refill  . ascorbic acid (VITAMIN C) 500 MG tablet Take 500 mg by mouth daily.        Marland Kitchen aspirin 81 MG EC tablet Take 81 mg by mouth daily.        Marland Kitchen atenolol (TENORMIN) 50 MG tablet Take 50 mg by mouth daily.        . calcium-vitamin D (OSCAL WITH D) 500-200 MG-UNIT per tablet Take 2 tablets by mouth 2 (two) times daily.        . cetirizine (ZYRTEC) 10 MG tablet Take 10 mg by mouth daily.        Marland Kitchen Cod Liver Oil CAPS Take 1 capsule by mouth daily.        . cyanocobalamin (,VITAMIN B-12,) 1000  MCG/ML injection Inject 1,000 mcg into the muscle every 30 (thirty) days.        . ferrous sulfate 325 (65 FE) MG tablet Take 325 mg by mouth daily.         No current facility-administered medications on file prior to visit.   Review of Systems Review of Systems  Constitutional: Negative for diaphoresis and unexpected weight change.  HENT: Negative for drooling and tinnitus.   Eyes: Negative for photophobia and visual disturbance.  Respiratory: Negative for choking and stridor.   Gastrointestinal: Negative for vomiting and blood in stool.  Genitourinary: Negative for hematuria and decreased urine volume.  Musculoskeletal: Negative for gait problem.  Skin: Negative for color change and wound.  Neurological: Negative for tremors and numbness.  Psychiatric/Behavioral: Negative for decreased concentration. The patient is not hyperactive.        Objective:   Physical Exam Physical Exam  VS noted Constitutional: Pt appears well-developed and well-nourished.  HENT: Head: Normocephalic.  Right Ear: External ear normal.  Left Ear: External ear normal.  Eyes: Conjunctivae and EOM are normal. Pupils are equal, round, and reactive to light.  Neck: Normal range of motion. Neck supple.  Cardiovascular: Normal rate and regular rhythm.   Pulmonary/Chest: Effort normal and breath sounds normal.  Abd:  Soft, NT, non-distended, + BS Neurological: Pt is alert. No cranial nerve deficit.  Skin: Skin is warm. No erythema.  Psychiatric: Pt behavior is normal. Thought content normal.         Assessment & Plan:

## 2011-06-07 NOTE — Assessment & Plan Note (Signed)
stable overall by hx and exam, most recent data reviewed with pt, and pt to continue medical treatment as before  BP Readings from Last 3 Encounters:  06/07/11 108/70  12/07/10 130/78  08/06/10 122/68

## 2011-06-07 NOTE — Assessment & Plan Note (Signed)
stable overall by hx and exam, most recent data reviewed with pt, and pt to continue medical treatment as before  Lab Results  Component Value Date   LDLCALC 113* 06/07/2011

## 2011-06-07 NOTE — Assessment & Plan Note (Signed)
Not charged today , but will need colnoscopy screening

## 2011-06-07 NOTE — Patient Instructions (Addendum)
You will be contacted regarding the referral for: Bone density test - Please stop at the desk as you leave OK to stay off the fosamax for now; we can consider prolia if the bone density results show osteoporosis Continue all other medications as before You had the B12 shot today Please go to LAB in the Basement for the blood and/or urine tests to be done today Please call the phone number [phone] (the [employer] System) for results of testing in 2-3 days;  When calling, simply dial the number, and when prompted enter the MRN number above (the Medical Record Number) and the # key, then the message should start. You will be contacted regarding the referral for: colonoscopy Please return in 6 months, or sooner if needed

## 2011-06-07 NOTE — Assessment & Plan Note (Signed)
D/w pt, to check dxa as she is due, last dxa reviewed with pt

## 2011-06-07 NOTE — Assessment & Plan Note (Signed)
stable overall by hx and exam, most recent data reviewed with pt, and pt to continue medical treatment as before  Lab Results  Component Value Date   HGBA1C 6.1 06/07/2011

## 2011-06-08 ENCOUNTER — Ambulatory Visit (INDEPENDENT_AMBULATORY_CARE_PROVIDER_SITE_OTHER)
Admission: RE | Admit: 2011-06-08 | Discharge: 2011-06-08 | Disposition: A | Discharge status: Home / Self Care | Payer: Medicare Other | Source: Ambulatory Visit | Attending: Internal Medicine | Admitting: Internal Medicine

## 2011-06-08 DIAGNOSIS — M8580 Other specified disorders of bone density and structure, unspecified site: Secondary | ICD-10-CM

## 2011-06-08 DIAGNOSIS — M949 Disorder of cartilage, unspecified: Secondary | ICD-10-CM

## 2011-08-16 LAB — URINALYSIS, ROUTINE W REFLEX MICROSCOPIC
Bilirubin Urine: NEGATIVE
Glucose, UA: NEGATIVE mg/dL
Hgb urine dipstick: NEGATIVE
Protein, ur: NEGATIVE mg/dL
Urobilinogen, UA: 1 mg/dL (ref 0.0–1.0)

## 2011-08-16 LAB — DIFFERENTIAL
Lymphocytes Relative: 30 % (ref 12–46)
Lymphs Abs: 1.8 10*3/uL (ref 0.7–4.0)
Neutro Abs: 3.5 10*3/uL (ref 1.7–7.7)
Neutrophils Relative %: 60 % (ref 43–77)

## 2011-08-16 LAB — TYPE AND SCREEN
ABO/RH(D): O POS
Antibody Screen: NEGATIVE

## 2011-08-16 LAB — BASIC METABOLIC PANEL
BUN: 16 mg/dL (ref 6–23)
Calcium: 9.8 mg/dL (ref 8.4–10.5)
GFR calc non Af Amer: 28 mL/min — ABNORMAL LOW (ref >60)
Potassium: 4.7 mEq/L (ref 3.5–5.1)

## 2011-08-16 LAB — CBC
HCT: 34 % — ABNORMAL LOW (ref 36.0–46.0)
Platelets: 132 10*3/uL — ABNORMAL LOW (ref 150–400)
WBC: 5.9 10*3/uL (ref 4.0–10.5)

## 2011-08-16 LAB — PROTIME-INR
INR: 1 (ref 0.00–1.49)
Prothrombin Time: 13.6 seconds (ref 11.6–15.2)

## 2011-08-16 LAB — APTT: aPTT: 41 seconds — ABNORMAL HIGH (ref 24–37)

## 2011-08-19 LAB — BASIC METABOLIC PANEL
BUN: 26 mg/dL — ABNORMAL HIGH (ref 6–23)
BUN: 27 mg/dL — ABNORMAL HIGH (ref 6–23)
CO2: 26 mEq/L (ref 19–32)
CO2: 28 mEq/L (ref 19–32)
Calcium: 8 mg/dL — ABNORMAL LOW (ref 8.4–10.5)
Calcium: 8 mg/dL — ABNORMAL LOW (ref 8.4–10.5)
Chloride: 104 mEq/L (ref 96–112)
Chloride: 110 mEq/L (ref 96–112)
Chloride: 111 mEq/L (ref 96–112)
Creatinine, Ser: 2.17 mg/dL — ABNORMAL HIGH (ref 0.4–1.2)
Creatinine, Ser: 2.2 mg/dL — ABNORMAL HIGH (ref 0.4–1.2)
GFR calc Af Amer: 27 mL/min — ABNORMAL LOW (ref >60)
GFR calc non Af Amer: 22 mL/min — ABNORMAL LOW (ref >60)
Glucose, Bld: 124 mg/dL — ABNORMAL HIGH (ref 70–99)
Glucose, Bld: 132 mg/dL — ABNORMAL HIGH (ref 70–99)
Potassium: 4.6 mEq/L (ref 3.5–5.1)
Sodium: 136 mEq/L (ref 135–145)
Sodium: 141 mEq/L (ref 135–145)

## 2011-08-19 LAB — CBC
HCT: 30.8 % — ABNORMAL LOW (ref 36.0–46.0)
Hemoglobin: 10.2 g/dL — ABNORMAL LOW (ref 12.0–15.0)
Hemoglobin: 7.3 g/dL — CL (ref 12.0–15.0)
MCHC: 33.9 g/dL (ref 30.0–36.0)
MCHC: 34.1 g/dL (ref 30.0–36.0)
MCV: 91.5 fL (ref 78.0–100.0)
MCV: 95 fL (ref 78.0–100.0)
MCV: 98.8 fL (ref 78.0–100.0)
Platelets: 67 10*3/uL — ABNORMAL LOW (ref 150–400)
RBC: 2.17 MIL/uL — ABNORMAL LOW (ref 3.87–5.11)
RBC: 2.57 MIL/uL — ABNORMAL LOW (ref 3.87–5.11)
RDW: 14.4 % (ref 11.5–15.5)
RDW: 15.9 % — ABNORMAL HIGH (ref 11.5–15.5)
RDW: 16.4 % — ABNORMAL HIGH (ref 11.5–15.5)

## 2011-08-19 LAB — CROSSMATCH

## 2011-08-19 LAB — HEMOGLOBIN AND HEMATOCRIT, BLOOD
HCT: 22.6 % — ABNORMAL LOW (ref 36.0–46.0)
Hemoglobin: 7.9 g/dL — CL (ref 12.0–15.0)

## 2011-08-19 LAB — HEMATOCRIT: HCT: 29.1 % — ABNORMAL LOW (ref 36.0–46.0)

## 2011-08-26 ENCOUNTER — Other Ambulatory Visit: Payer: Self-pay | Admitting: Internal Medicine

## 2011-09-26 ENCOUNTER — Emergency Department (HOSPITAL_COMMUNITY)
Admission: EM | Admit: 2011-09-26 | Discharge: 2011-09-26 | Disposition: A | Discharge status: Home / Self Care | Payer: Medicare Other | Attending: Emergency Medicine | Admitting: Emergency Medicine

## 2011-09-26 ENCOUNTER — Other Ambulatory Visit: Payer: Self-pay

## 2011-09-26 ENCOUNTER — Encounter (HOSPITAL_COMMUNITY): Payer: Self-pay

## 2011-09-26 ENCOUNTER — Emergency Department (HOSPITAL_COMMUNITY): Payer: Medicare Other

## 2011-09-26 DIAGNOSIS — R0989 Other specified symptoms and signs involving the circulatory and respiratory systems: Secondary | ICD-10-CM | POA: Insufficient documentation

## 2011-09-26 DIAGNOSIS — R059 Cough, unspecified: Secondary | ICD-10-CM | POA: Insufficient documentation

## 2011-09-26 DIAGNOSIS — R002 Palpitations: Secondary | ICD-10-CM | POA: Insufficient documentation

## 2011-09-26 DIAGNOSIS — Z8711 Personal history of peptic ulcer disease: Secondary | ICD-10-CM | POA: Insufficient documentation

## 2011-09-26 DIAGNOSIS — Z7982 Long term (current) use of aspirin: Secondary | ICD-10-CM | POA: Insufficient documentation

## 2011-09-26 DIAGNOSIS — I10 Essential (primary) hypertension: Secondary | ICD-10-CM | POA: Insufficient documentation

## 2011-09-26 DIAGNOSIS — R61 Generalized hyperhidrosis: Secondary | ICD-10-CM | POA: Insufficient documentation

## 2011-09-26 DIAGNOSIS — R0602 Shortness of breath: Secondary | ICD-10-CM | POA: Insufficient documentation

## 2011-09-26 DIAGNOSIS — R0609 Other forms of dyspnea: Secondary | ICD-10-CM | POA: Insufficient documentation

## 2011-09-26 DIAGNOSIS — R079 Chest pain, unspecified: Secondary | ICD-10-CM | POA: Insufficient documentation

## 2011-09-26 DIAGNOSIS — E785 Hyperlipidemia, unspecified: Secondary | ICD-10-CM | POA: Insufficient documentation

## 2011-09-26 DIAGNOSIS — Z79899 Other long term (current) drug therapy: Secondary | ICD-10-CM | POA: Insufficient documentation

## 2011-09-26 DIAGNOSIS — I498 Other specified cardiac arrhythmias: Secondary | ICD-10-CM | POA: Insufficient documentation

## 2011-09-26 DIAGNOSIS — E119 Type 2 diabetes mellitus without complications: Secondary | ICD-10-CM | POA: Insufficient documentation

## 2011-09-26 DIAGNOSIS — E039 Hypothyroidism, unspecified: Secondary | ICD-10-CM | POA: Insufficient documentation

## 2011-09-26 DIAGNOSIS — R05 Cough: Secondary | ICD-10-CM

## 2011-09-26 DIAGNOSIS — J45909 Unspecified asthma, uncomplicated: Secondary | ICD-10-CM | POA: Insufficient documentation

## 2011-09-26 LAB — DIFFERENTIAL
Basophils Absolute: 0 10*3/uL (ref 0.0–0.1)
Basophils Relative: 0 % (ref 0–1)
Eosinophils Absolute: 0.2 10*3/uL (ref 0.0–0.7)
Monocytes Absolute: 0.5 10*3/uL (ref 0.1–1.0)
Monocytes Relative: 10 % (ref 3–12)
Neutrophils Relative %: 42 % — ABNORMAL LOW (ref 43–77)

## 2011-09-26 LAB — CARDIAC PANEL(CRET KIN+CKTOT+MB+TROPI)
Relative Index: 2.1 (ref 0.0–2.5)
Total CK: 117 U/L (ref 7–177)

## 2011-09-26 LAB — COMPREHENSIVE METABOLIC PANEL
ALT: 8 U/L (ref 0–35)
AST: 15 U/L (ref 0–37)
Alkaline Phosphatase: 90 U/L (ref 39–117)
CO2: 27 mEq/L (ref 19–32)
Calcium: 9.6 mg/dL (ref 8.4–10.5)
GFR calc non Af Amer: 42 mL/min — ABNORMAL LOW (ref >90)
Glucose, Bld: 92 mg/dL (ref 70–99)
Potassium: 4.2 mEq/L (ref 3.5–5.1)
Sodium: 142 mEq/L (ref 135–145)

## 2011-09-26 LAB — POCT I-STAT TROPONIN I

## 2011-09-26 LAB — BASIC METABOLIC PANEL
CO2: 27 mEq/L (ref 19–32)
Calcium: 9.6 mg/dL (ref 8.4–10.5)
Chloride: 105 mEq/L (ref 96–112)
Glucose, Bld: 97 mg/dL (ref 70–99)

## 2011-09-26 LAB — CBC
HCT: 35.1 % — ABNORMAL LOW (ref 36.0–46.0)
Hemoglobin: 12 g/dL (ref 12.0–15.0)
MCH: 30 pg (ref 26.0–34.0)
MCHC: 34.2 g/dL (ref 30.0–36.0)
RDW: 12.7 % (ref 11.5–15.5)

## 2011-09-26 LAB — APTT: aPTT: 36 seconds (ref 24–37)

## 2011-09-26 MED ORDER — ALBUTEROL SULFATE (5 MG/ML) 0.5% IN NEBU
INHALATION_SOLUTION | RESPIRATORY_TRACT | Status: AC
  Filled 2011-09-26: qty 0.5

## 2011-09-26 MED ORDER — SODIUM CHLORIDE 0.9 % IV BOLUS (SEPSIS)
1000.0000 mL | Freq: Once | INTRAVENOUS | Status: AC
  Administered 2011-09-26: 1000 mL via INTRAVENOUS

## 2011-09-26 MED ORDER — ALBUTEROL SULFATE (5 MG/ML) 0.5% IN NEBU
5.0000 mg | INHALATION_SOLUTION | Freq: Once | RESPIRATORY_TRACT | Status: AC
  Administered 2011-09-26: 5 mg via RESPIRATORY_TRACT

## 2011-09-26 MED ORDER — SODIUM CHLORIDE 0.9 % IJ SOLN: 3.0000 mL | INTRAMUSCULAR | Status: DC | PRN

## 2011-09-26 MED ORDER — NITROGLYCERIN IN D5W 200-5 MCG/ML-% IV SOLN: 10.0000 ug/min | INTRAVENOUS | Status: DC

## 2011-09-26 MED ORDER — IPRATROPIUM BROMIDE 0.02 % IN SOLN
RESPIRATORY_TRACT | Status: AC
  Filled 2011-09-26: qty 2.5

## 2011-09-26 MED ORDER — SODIUM CHLORIDE 0.9 % IV SOLN
250.0000 mL | INTRAVENOUS | Status: DC
  Administered 2011-09-26 (×2): 250 mL via INTRAVENOUS

## 2011-09-26 MED ORDER — SODIUM CHLORIDE 0.9 % IJ SOLN: 3.0000 mL | Freq: Two times a day (BID) | INTRAMUSCULAR | Status: DC

## 2011-09-26 MED ORDER — IPRATROPIUM BROMIDE 0.02 % IN SOLN
0.5000 mg | Freq: Once | RESPIRATORY_TRACT | Status: AC
  Administered 2011-09-26: 0.5 mg via RESPIRATORY_TRACT

## 2011-09-26 MED ORDER — ASPIRIN 81 MG PO CHEW
324.0000 mg | CHEWABLE_TABLET | Freq: Once | ORAL | Status: AC
  Administered 2011-09-26: 324 mg via ORAL
  Filled 2011-09-26: qty 4

## 2011-09-26 MED ORDER — NITROGLYCERIN 0.4 MG SL SUBL: 0.4000 mg | SUBLINGUAL_TABLET | SUBLINGUAL | Status: DC | PRN

## 2011-09-26 NOTE — ED Notes (Signed)
Patient presents with left sided chest pressure with radiation to left arm today suddenly at 1300 walk walking. Patient reports pain to be intermittent and denies pain at this time. Patient also reporting shortness of breath.

## 2011-09-26 NOTE — ED Provider Notes (Signed)
The patient is a 70 year old female who presents for evaluation of chest pain. She reports that she had pain this afternoon while walking from her car to store, that caused her to double over and hold herself up with a nearby drinking found. She reports the pain as squeezing, retrosternal, associated with dyspnea and shortness of breath, but without palpitations. She does report that she was diaphoretic when the pain occurred. She reports that the pain was brief in its severe form, lasting only several minutes before resolving. She has had return of this chest pain intermittently since then, lasting 1-2 minutes at a time. She is currently chest pain-free. She has a past medical history including hypertension, hyperlipidemia, and diabetes, but no known coronary artery disease or heart attack in the past. On examination, the patient is awake, alert, and oriented to person, place, time, and event, and in no apparent distress. Her breathing is nonlabored, and her lung sounds are clear to auscultation in all fields without rales, rhonchi, wheezes. Her heart sounds are normal without murmur rub or gallop, and her regular in rate and rhythm. Her EKG shows a sinus bradycardia but otherwise normal findings. The patient will need to be moved back into the emergency department for further evaluation.   Date: 09/26/2011  Rate: 50  Rhythm: sinus bradycardia  QRS Axis: normal  Intervals: normal  ST/T Wave abnormalities: normal  Conduction Disutrbances:none  Narrative Interpretation: Non-provocative EKG  Old EKG Reviewed: none available    Felisa Bonier, MD 09/26/11 2035

## 2011-09-26 NOTE — ED Provider Notes (Signed)
History     CSN: [id number] Arrival date & time: 09/26/2011  4:05 PM   First MD Initiated Contact with Patient 09/26/11 1922      Chief Complaint  Patient presents with  . Chest Pain   HPI History provided by the patient and family.  Patient with history of high blood pressure and high cholesterol presents with complaints of left chest pain began earlier this afternoon while shopping with her grandchildren. She reports walking into the store and holding a shopping cart and suddenly having brief repetitive episodic sharp chest pains radiating to left upper arm. She reports feeling 6 of these pains consecutively over a few seconds. Patient was sat down and given a glass of water which seemed to improve symptoms but she reports having a persistent squeezing and dull ache in chest. Since that time patient has some chest discomfort but only when coughing. She denies pain with deep breath. She admits to having increased cough productive of sputum over the past few days. She also states she has history of asthma. Patient had another episode of brief sharp pains in left chest later this afternoon and early evening while talking on the phone with sister. This episode lasted only seconds as well. Patient denies any nausea vomiting, sweating or heart palpitations. There is no hemoptysis. She denies recent fever, chills.      Past Medical History  Diagnosis Date  . Hx of colonic polyps   . Chronic headaches     Chronic pain  . Hypothyroidism   . Hyperlipidemia   . Anemia     NOS  . DJD (degenerative joint disease)     Spine, hands  . Asthma   . Hearing loss   . Sickle cell trait   . Migraine   . Peptic ulcer disease   . Allergic rhinitis   . DDD (degenerative disc disease), cervical   . CTS (carpal tunnel syndrome) 8/08    Right, severe, emg/ncs; Left, moderate, emg/ncs  . Stenosing tenosynovitis of thumb     Left  . Hypertension   . Diabetes mellitus     Type II-diet  . Vitamin D  deficiency   . Peripheral neuropathy   . Disc disease, degenerative, cervical 06/07/2011  . Chronic neck pain 06/07/2011    Past Surgical History  Procedure Date  . Knee surgery   . Abdominal hysterectomy     Family History  Problem Relation Age of Onset  . Diabetes Mother     DM  . Arthritis Other   . Hyperlipidemia Other   . Diabetes Other     1st degree relative    History  Substance Use Topics  . Smoking status: Never Smoker   . Smokeless tobacco: Not on file  . Alcohol Use: No    OB History    Grav Para Term Preterm Abortions TAB SAB Ect Mult Living                  Review of Systems  Constitutional: Negative for fever and chills.  Respiratory: Negative for cough.   Cardiovascular: Positive for chest pain and palpitations.  Gastrointestinal: Negative for nausea, vomiting, abdominal pain, diarrhea and constipation.  Genitourinary: Negative for dysuria.  All other systems reviewed and are negative.    Allergies  Naproxen  Home Medications   Current Outpatient Rx  Name Route Sig Dispense Refill  . ASCORBIC ACID 500 MG PO TABS Oral Take 500 mg by mouth daily.      Marland Kitchen  ASPIRIN 81 MG PO TBEC Oral Take 81 mg by mouth daily.      . ATENOLOL 50 MG PO TABS  TAKE ONE TABLET BY MOUTH EVERY DAY 90 tablet 3  . CALCIUM CARBONATE-VITAMIN D 500-200 MG-UNIT PO TABS Oral Take 2 tablets by mouth 2 (two) times daily.      Marland Kitchen CETIRIZINE HCL 10 MG PO TABS Oral Take 10 mg by mouth daily.      . COD LIVER OIL PO CAPS Oral Take 1 capsule by mouth daily.      . CYANOCOBALAMIN 1000 MCG/ML IJ SOLN Intramuscular Inject 1,000 mcg into the muscle every 30 (thirty) days.      Di Kindle SULFATE 325 (65 FE) MG PO TABS Oral Take 325 mg by mouth daily.      Marland Kitchen HYDROCODONE-ACETAMINOPHEN 5-500 MG PO TABS Oral Take 1 tablet by mouth 2 (two) times daily as needed. For pain.       BP 130/47  Pulse 54  Temp(Src) 98 F (36.7 C) (Oral)  Resp 18  SpO2 98%  Physical Exam  Nursing note and  vitals reviewed. Constitutional: She is oriented to person, place, and time. She appears well-developed and well-nourished. No distress.  HENT:  Head: Normocephalic and atraumatic.  Mouth/Throat: Oropharynx is clear and moist.  Cardiovascular: Normal rate and regular rhythm.   No murmur heard. Pulmonary/Chest: Effort normal and breath sounds normal. She has no wheezes. She has no rales. She exhibits no tenderness.       Coughing present  Abdominal: Soft. There is no tenderness. There is no rebound.  Neurological: She is alert and oriented to person, place, and time.  Skin: Skin is warm. No rash noted.  Psychiatric: She has a normal mood and affect.    ED Course  Procedures (including critical care time)  Labs Reviewed  CBC - Abnormal; Notable for the following:    HCT 35.1 (*)    Platelets 59 (*) PLATELET COUNT CONFIRMED BY SMEAR   All other components within normal limits  DIFFERENTIAL - Abnormal; Notable for the following:    Neutrophils Relative 42 (*)    All other components within normal limits  BASIC METABOLIC PANEL - Abnormal; Notable for the following:    Creatinine, Ser 1.25 (*)    GFR calc non Af Amer 43 (*)    GFR calc Af Amer 49 (*)    All other components within normal limits  COMPREHENSIVE METABOLIC PANEL - Abnormal; Notable for the following:    Creatinine, Ser 1.27 (*)    GFR calc non Af Amer 42 (*)    GFR calc Af Amer 48 (*)    All other components within normal limits  POCT I-STAT TROPONIN I  PRO B NATRIURETIC PEPTIDE  CARDIAC PANEL(CRET KIN+CKTOT+MB+TROPI)  PROTIME-INR  APTT  I-STAT TROPONIN I   Results for orders placed during the hospital encounter of 09/26/11  CBC      Component Value Range   WBC 5.1  4.0 - 10.5 (K/uL)   RBC 4.00  3.87 - 5.11 (MIL/uL)   Hemoglobin 12.0  12.0 - 15.0 (g/dL)   HCT 91.4 (*) 78.2 - 46.0 (%)   MCV 87.8  78.0 - 100.0 (fL)   MCH 30.0  26.0 - 34.0 (pg)   MCHC 34.2  30.0 - 36.0 (g/dL)   RDW 95.6  21.3 - 08.6 (%)    Platelets 59 (*) 150 - 400 (K/uL)  DIFFERENTIAL      Component Value Range  Neutrophils Relative 42 (*) 43 - 77 (%)   Neutro Abs 2.2  1.7 - 7.7 (K/uL)   Lymphocytes Relative 45  12 - 46 (%)   Lymphs Abs 2.3  0.7 - 4.0 (K/uL)   Monocytes Relative 10  3 - 12 (%)   Monocytes Absolute 0.5  0.1 - 1.0 (K/uL)   Eosinophils Relative 3  0 - 5 (%)   Eosinophils Absolute 0.2  0.0 - 0.7 (K/uL)   Basophils Relative 0  0 - 1 (%)   Basophils Absolute 0.0  0.0 - 0.1 (K/uL)  BASIC METABOLIC PANEL      Component Value Range   Sodium 141  135 - 145 (mEq/L)   Potassium 4.4  3.5 - 5.1 (mEq/L)   Chloride 105  96 - 112 (mEq/L)   CO2 27  19 - 32 (mEq/L)   Glucose, Bld 97  70 - 99 (mg/dL)   BUN 14  6 - 23 (mg/dL)   Creatinine, Ser 1.61 (*) 0.50 - 1.10 (mg/dL)   Calcium 9.6  8.4 - 09.6 (mg/dL)   GFR calc non Af Amer 43 (*) >90 (mL/min)   GFR calc Af Amer 49 (*) >90 (mL/min)  POCT I-STAT TROPONIN I      Component Value Range   Troponin i, poc 0.00  0.00 - 0.08 (ng/mL)   Comment 3           PRO B NATRIURETIC PEPTIDE      Component Value Range   BNP, POC 81.5  0 - 125 (pg/mL)  CARDIAC PANEL(CRET KIN+CKTOT+MB+TROPI)      Component Value Range   Total CK 117  7 - 177 (U/L)   CK, MB 2.4  0.3 - 4.0 (ng/mL)   Troponin I <0.30  <0.30 (ng/mL)   Relative Index 2.1  0.0 - 2.5   COMPREHENSIVE METABOLIC PANEL      Component Value Range   Sodium 142  135 - 145 (mEq/L)   Potassium 4.2  3.5 - 5.1 (mEq/L)   Chloride 106  96 - 112 (mEq/L)   CO2 27  19 - 32 (mEq/L)   Glucose, Bld 92  70 - 99 (mg/dL)   BUN 14  6 - 23 (mg/dL)   Creatinine, Ser 0.45 (*) 0.50 - 1.10 (mg/dL)   Calcium 9.6  8.4 - 40.9 (mg/dL)   Total Protein 7.7  6.0 - 8.3 (g/dL)   Albumin 3.9  3.5 - 5.2 (g/dL)   AST 15  0 - 37 (U/L)   ALT 8  0 - 35 (U/L)   Alkaline Phosphatase 90  39 - 117 (U/L)   Total Bilirubin 0.3  0.3 - 1.2 (mg/dL)   GFR calc non Af Amer 42 (*) >90 (mL/min)   GFR calc Af Amer 48 (*) >90 (mL/min)  PROTIME-INR       Component Value Range   Prothrombin Time 13.6  11.6 - 15.2 (seconds)   INR 1.02  0.00 - 1.49   APTT      Component Value Range   aPTT 36  24 - 37 (seconds)      Dg Chest 2 View  09/26/2011  *RADIOLOGY REPORT*  Clinical Data: Hypertension and chest pain, radiating to left arm.  CHEST - 2 VIEW  Comparison: Chest radiograph 04/28/2010 and 01/16/2009  Findings: Heart size is mildly prominent.  Thoracic aorta contour, hilar, and pulmonary vascularity within normal limits.  No focal opacity, edema, or pleural effusion.  No acute bony abnormality.  IMPRESSION: Mild cardiomegaly.  The lungs are clear.  Original Report Authenticated By: Britta Mccreedy, M.D.     1. Chest pain   2. Cough       MDM  Pt seen and evaluated.  Pt in no acute distress.  Initial work up started by Dr. Fredricka Bonine.    Labs so far unremarkable.  Patient seen and discussed with attending physician, Dr. Ethelda Chick. Patient's symptoms seem pleuritic in nature, patient has history of asthma. Patient's sharp pains while shopping and later in the afternoon were very brief lasting only seconds. It was atypical for ACS. Patient has normal ECG, chest x-ray, cardiac markers. Patient is felt stable to return home and have outpatient stress testing.  Spoke with [facility] cardiology. He agrees symptoms seem atypical for ACS. The patient has -0 three-hour troponins, unchanged ECG. He will plan to make the call and leave a message for outpatient stress testing. He will also call and leave a message for Dr. Oliver Barre patient's PCP. Will discharge patient at this time the.     Date: 09/26/2011 4:08 PM  Rate: 50  Rhythm: sinus bradycardia  QRS Axis: normal  Intervals: normal  ST/T Wave abnormalities: normal  Conduction Disutrbances:none  Narrative Interpretation: Low-voltage  Old EKG Reviewed: unchanged from 04/28/2000        Angus Seller, PA 09/26/11 2259

## 2011-09-26 NOTE — ED Notes (Signed)
Pt placed on monitor, pulse ox.

## 2011-09-26 NOTE — ED Provider Notes (Signed)
Medical screening examination/treatment/procedure(s) were conducted as a shared visit with non-physician practitioner(s) and myself.  I personally evaluated the patient during the encounter Complains of anterior chest tightness with coughing for the past several days coughing up yellow sputum. Patient also reports that she had 2 episodes of chest pain sharp in nature radiating to left arm this afternoon each lasting less than 5 seconds. On exam lungs clear to auscultation heart regular rate and rhythm coughing frequently Assessment strongly doubt acute coronary syndrome based on symptoms highly atypical nonacute EKG and negative markers. symptoms are most consistent with bronchitis  Doug Sou, MD 09/26/11 2332

## 2011-09-26 NOTE — ED Notes (Signed)
Patient states she began having chest pain earlier. Describes the pain as crushing with radiation to left arm. Denies any prior episodes. Breathing treatment complete. [place] Sandwich given to patient

## 2011-09-27 ENCOUNTER — Encounter: Payer: Self-pay | Admitting: Internal Medicine

## 2011-09-27 ENCOUNTER — Ambulatory Visit (INDEPENDENT_AMBULATORY_CARE_PROVIDER_SITE_OTHER): Payer: Medicare Other | Admitting: Internal Medicine

## 2011-09-27 VITALS — BP 132/78 | HR 57 | Temp 97.4°F | Ht 64.0 in | Wt 241.5 lb

## 2011-09-27 DIAGNOSIS — I10 Essential (primary) hypertension: Secondary | ICD-10-CM

## 2011-09-27 DIAGNOSIS — D509 Iron deficiency anemia, unspecified: Secondary | ICD-10-CM

## 2011-09-27 DIAGNOSIS — E119 Type 2 diabetes mellitus without complications: Secondary | ICD-10-CM

## 2011-09-27 DIAGNOSIS — E538 Deficiency of other specified B group vitamins: Secondary | ICD-10-CM | POA: Insufficient documentation

## 2011-09-27 DIAGNOSIS — J45901 Unspecified asthma with (acute) exacerbation: Secondary | ICD-10-CM | POA: Insufficient documentation

## 2011-09-27 DIAGNOSIS — Z23 Encounter for immunization: Secondary | ICD-10-CM

## 2011-09-27 HISTORY — DX: Deficiency of other specified B group vitamins: E53.8

## 2011-09-27 MED ORDER — METHYLPREDNISOLONE ACETATE PF 80 MG/ML IJ SUSP
120.0000 mg | Freq: Once | INTRAMUSCULAR | Status: AC
  Administered 2011-09-27: 120 mg via INTRAMUSCULAR

## 2011-09-27 MED ORDER — ALBUTEROL SULFATE HFA 108 (90 BASE) MCG/ACT IN AERS: 2.0000 | INHALATION_SPRAY | Freq: Four times a day (QID) | RESPIRATORY_TRACT | Status: DC | PRN

## 2011-09-27 MED ORDER — CYANOCOBALAMIN 1000 MCG/ML IJ SOLN
1000.0000 ug | Freq: Once | INTRAMUSCULAR | Status: AC
  Administered 2011-09-27: 1000 ug via INTRAMUSCULAR

## 2011-09-27 MED ORDER — PREDNISONE 10 MG PO TABS: ORAL_TABLET | ORAL | Status: DC

## 2011-09-27 NOTE — Assessment & Plan Note (Signed)
Mild, for depomedroll IM, predpack asd, alb MDI prn,  to f/u any worsening symptoms or concerns  SpO2 Readings from Last 3 Encounters:  09/27/11 97%  09/26/11 97%  06/07/11 96%

## 2011-09-27 NOTE — Assessment & Plan Note (Signed)
Also for b12 replacement today, o/w stable overall by hx and exam,  and pt to continue medical treatment as before

## 2011-09-27 NOTE — Assessment & Plan Note (Signed)
stable overall by hx and exam, most recent data reviewed with pt, and pt to continue medical treatment as before  BP Readings from Last 3 Encounters:  09/27/11 132/78  09/26/11 119/44  06/07/11 108/70

## 2011-09-27 NOTE — Patient Instructions (Signed)
You had the steroid shot today Take all new medications as prescribed Continue all other medications as before  

## 2011-09-27 NOTE — Progress Notes (Signed)
Subjective:    Patient ID: [name], female    DOB: October 10, 1941, 70 y.o.   MRN: [id number]  HPI  Here to f/u after being seen in ER last night with an acute episode of chest discomfort with some radiation to the left arm assoc with sob, described as squeezing, knifelike and pressure type, "like my lungs collapsing."  Tx with asa, o2, IVF and eval with ecg/routine lab/cxr essentially neg for ACS.  Ultimately found to have mild wheezing, with all symptoms improved with alb neb tx.  Dx with instructions to /u here today, still with mild chest symtpoms as above intermittent.  For flu shot, b12 shots today.   Pt denies fever, wt loss, night sweats, loss of appetite, or other constitutional symptoms No other acute complaints or concerns Past Medical History  Diagnosis Date  . Hx of colonic polyps   . Chronic headaches     Chronic pain  . Hypothyroidism   . Hyperlipidemia   . Anemia     NOS  . DJD (degenerative joint disease)     Spine, hands  . Asthma   . Hearing loss   . Sickle cell trait   . Migraine   . Peptic ulcer disease   . Allergic rhinitis   . DDD (degenerative disc disease), cervical   . CTS (carpal tunnel syndrome) 8/08    Right, severe, emg/ncs; Left, moderate, emg/ncs  . Stenosing tenosynovitis of thumb     Left  . Hypertension   . Diabetes mellitus     Type II-diet  . Vitamin D deficiency   . Peripheral neuropathy   . Disc disease, degenerative, cervical 06/07/2011  . Chronic neck pain 06/07/2011   Past Surgical History  Procedure Date  . Knee surgery   . Abdominal hysterectomy     reports that she has never smoked. She does not have any smokeless tobacco history on file. She reports that she does not drink alcohol or use illicit drugs. family history includes Arthritis in her other; Diabetes in her mother and other; and Hyperlipidemia in her other. Allergies  Allergen Reactions  . Naproxen    Current Outpatient Prescriptions on File Prior to Visit  Medication  Sig Dispense Refill  . ascorbic acid (VITAMIN C) 500 MG tablet Take 500 mg by mouth daily.        Marland Kitchen aspirin 81 MG EC tablet Take 81 mg by mouth daily.        Marland Kitchen atenolol (TENORMIN) 50 MG tablet TAKE ONE TABLET BY MOUTH EVERY DAY  90 tablet  3  . calcium-vitamin D (OSCAL WITH D) 500-200 MG-UNIT per tablet Take 2 tablets by mouth 2 (two) times daily.        . cetirizine (ZYRTEC) 10 MG tablet Take 10 mg by mouth daily.        Marland Kitchen Cod Liver Oil CAPS Take 1 capsule by mouth daily.        . cyanocobalamin (,VITAMIN B-12,) 1000 MCG/ML injection Inject 1,000 mcg into the muscle every 30 (thirty) days.        . ferrous sulfate 325 (65 FE) MG tablet Take 325 mg by mouth daily.        Marland Kitchen HYDROcodone-acetaminophen (VICODIN) 5-500 MG per tablet Take 1 tablet by mouth 2 (two) times daily as needed. For pain.        Current Facility-Administered Medications on File Prior to Visit  Medication Dose Route Frequency Provider Last Rate Last Dose  . albuterol (PROVENTIL) (5  MG/ML) 0.5% nebulizer solution 5 mg  5 mg Nebulization Once Angus Seller, PA   5 mg at 09/26/11 2200  . ipratropium (ATROVENT) nebulizer solution 0.5 mg  0.5 mg Nebulization Once Angus Seller, PA   0.5 mg at 09/26/11 2200  . DISCONTD: 0.9 %  sodium chloride infusion  250 mL Intravenous Continuous Felisa Bonier, MD 1 mL/hr at 09/26/11 2101 250 mL at 09/26/11 2101  . DISCONTD: albuterol (PROVENTIL) (5 MG/ML) 0.5% nebulizer solution           . DISCONTD: ipratropium (ATROVENT) 0.02 % nebulizer solution           . DISCONTD: nitroGLYCERIN (NITROSTAT) SL tablet 0.4 mg  0.4 mg Sublingual Q5 min PRN Felisa Bonier, MD      . DISCONTD: nitroGLYCERIN 0.2 mg/mL in dextrose 5 % infusion  10 mcg/min Intravenous Titrated Felisa Bonier, MD      . DISCONTD: sodium chloride 0.9 % injection 3 mL  3 mL Intravenous Q12H Felisa Bonier, MD      . DISCONTD: sodium chloride 0.9 % injection 3 mL  3 mL Intravenous PRN Felisa Bonier, MD       Review of  Systems Review of Systems  Constitutional: Negative for diaphoresis and unexpected weight change.  HENT: Negative for drooling and tinnitus.   Eyes: Negative for photophobia and visual disturbance.  Respiratory: Negative for choking and stridor.   Gastrointestinal: Negative for vomiting and blood in stool.  Genitourinary: Negative for hematuria and decreased urine volume.     Objective:   Physical Exam BP 132/78  Pulse 57  Temp(Src) 97.4 F (36.3 C) (Oral)  Ht 5\' 4"  (1.626 m)  Wt 241 lb 8 oz (109.544 kg)  BMI 41.45 kg/m2  SpO2 97% Physical Exam  VS noted Constitutional: Pt appears well-developed and well-nourished.  HENT: Head: Normocephalic.  Right Ear: External ear normal.  Left Ear: External ear normal.  Eyes: Conjunctivae and EOM are normal. Pupils are equal, round, and reactive to light.  Neck: Normal range of motion. Neck supple.  Cardiovascular: Normal rate and regular rhythm.   Pulmonary/Chest: Effort normal and breath sounds decreased, midl wheeze bilat.  Abd:  Soft, NT, non-distended, + BS Neurological: Pt is alert. No cranial nerve deficit.  Skin: Skin is warm. No erythema.  Psychiatric: Pt behavior is normal. Thought content normal.     Assessment & Plan:

## 2011-09-27 NOTE — Assessment & Plan Note (Addendum)
stable overall by hx and exam, most recent data reviewed with pt, and pt to continue medical treatment as before  Lab Results  Component Value Date   HGBA1C 6.1 06/07/2011   to cont diet

## 2011-10-13 ENCOUNTER — Telehealth: Payer: Self-pay

## 2011-10-13 NOTE — Telephone Encounter (Signed)
Message left on Call a Nurse to call [facility] to clarify a prescription. Called [phone] to speak to Jasmine December, left message for Jasmine December to return my call

## 2011-10-14 NOTE — Telephone Encounter (Signed)
Called left message to call back 

## 2011-10-17 ENCOUNTER — Telehealth: Payer: Self-pay

## 2011-10-17 DIAGNOSIS — J45901 Unspecified asthma with (acute) exacerbation: Secondary | ICD-10-CM

## 2011-10-17 MED ORDER — PREDNISONE 10 MG PO TABS: ORAL_TABLET | ORAL | Status: DC

## 2011-10-17 MED ORDER — ALBUTEROL SULFATE HFA 108 (90 BASE) MCG/ACT IN AERS: 2.0000 | INHALATION_SPRAY | Freq: Four times a day (QID) | RESPIRATORY_TRACT | Status: DC | PRN

## 2011-10-17 NOTE — Telephone Encounter (Signed)
Patient called requesting a hardcopy of recent Albuterol and Prednisone prescription mailed to her home.  She is requesting these to give to [facility]. To fill at [facility].

## 2011-11-16 ENCOUNTER — Ambulatory Visit (INDEPENDENT_AMBULATORY_CARE_PROVIDER_SITE_OTHER): Payer: Medicare Other | Admitting: Internal Medicine

## 2011-11-16 ENCOUNTER — Encounter: Payer: Self-pay | Admitting: Internal Medicine

## 2011-11-16 VITALS — BP 106/70 | HR 56 | Temp 98.5°F | Ht 64.0 in | Wt 243.2 lb

## 2011-11-16 DIAGNOSIS — I10 Essential (primary) hypertension: Secondary | ICD-10-CM

## 2011-11-16 DIAGNOSIS — M542 Cervicalgia: Secondary | ICD-10-CM

## 2011-11-16 DIAGNOSIS — E538 Deficiency of other specified B group vitamins: Secondary | ICD-10-CM

## 2011-11-16 DIAGNOSIS — G8929 Other chronic pain: Secondary | ICD-10-CM

## 2011-11-16 DIAGNOSIS — E119 Type 2 diabetes mellitus without complications: Secondary | ICD-10-CM

## 2011-11-16 DIAGNOSIS — Z0000 Encounter for general adult medical examination without abnormal findings: Secondary | ICD-10-CM

## 2011-11-16 MED ORDER — CYANOCOBALAMIN 1000 MCG/ML IJ SOLN
1000.0000 ug | Freq: Once | INTRAMUSCULAR | Status: AC
  Administered 2011-11-16: 1000 ug via INTRAMUSCULAR

## 2011-11-16 MED ORDER — CYCLOBENZAPRINE HCL 5 MG PO TABS: 5.0000 mg | ORAL_TABLET | Freq: Three times a day (TID) | ORAL | Status: AC | PRN

## 2011-11-16 NOTE — Assessment & Plan Note (Signed)
Not charged today; due for colonscopy - will help arrange

## 2011-11-16 NOTE — Assessment & Plan Note (Signed)
Most likely due to underlying DJD/DDD, but with ? midl more recent strain, add flexeril prn, Continue all other medications as before, exam o/w bening,  to f/u any worsening symptoms or concerns

## 2011-11-16 NOTE — Progress Notes (Signed)
Subjective:    Patient ID: [name], female    DOB: 1941-01-02, 71 y.o.   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  Still has vicodin but tries not to take it, half tab helps at night sometimes, takes a whole pill when at school.    Due for B12 today.  Wheezing from last visit resolved with prednisone, not used the inhaler for over 2 wks.   Pt denies chest pain, increased sob or doe, wheezing, orthopnea, PND, increased LE swelling, palpitations, dizziness or syncope.  Pt denies new neurological symptoms such as new headache, or facial or extremity weakness or numbness   Pt denies polydipsia, polyuria, or low sugar symptoms such as weakness or confusion improved with po intake.  Pt states overall good compliance with meds, trying to follow lower cholesterol diet, wt overall stable but little exercise however.  Walks with cane on rainy days, no recent falls.  Past Medical History  Diagnosis Date  . Hx of colonic polyps   . Chronic headaches     Chronic pain  . Hypothyroidism   . Hyperlipidemia   . Anemia     NOS  . DJD (degenerative joint disease)     Spine, hands  . Asthma   . Hearing loss   . Sickle cell trait   . Migraine   . Peptic ulcer disease   . Allergic rhinitis   . DDD (degenerative disc disease), cervical   . CTS (carpal tunnel syndrome) 8/08    Right, severe, emg/ncs; Left, moderate, emg/ncs  . Stenosing tenosynovitis of thumb     Left  . Hypertension   . Diabetes mellitus     Type II-diet    . Vitamin D deficiency   . Peripheral neuropathy   . Disc disease, degenerative, cervical 06/07/2011  . Chronic neck pain 06/07/2011  . B12 deficiency 09/27/2011   Past Surgical History  Procedure Date  . Knee surgery   . Abdominal hysterectomy     reports that she has never smoked. She does not have any smokeless tobacco history on file. She reports that she does not drink alcohol or use illicit drugs. family history includes Arthritis in her other; Diabetes in her mother and other; and Hyperlipidemia in her other. Allergies  Allergen Reactions  . Naproxen    Current Outpatient Prescriptions on File Prior to Visit  Medication Sig Dispense Refill  . albuterol (PROVENTIL HFA;VENTOLIN HFA) 108 (90 BASE) MCG/ACT inhaler Inhale 2 puffs into the lungs every 6 (six) hours as needed for wheezing.  1 Inhaler  11  . ascorbic acid (VITAMIN C) 500 MG tablet Take 500 mg by mouth daily.        Marland Kitchen aspirin 81 MG EC tablet Take 81 mg by mouth daily.        Marland Kitchen atenolol (TENORMIN) 50 MG tablet TAKE ONE TABLET BY MOUTH EVERY DAY  90 tablet  3  . calcium-vitamin D (OSCAL WITH D) 500-200 MG-UNIT  per tablet Take 2 tablets by mouth 2 (two) times daily.        . cetirizine (ZYRTEC) 10 MG tablet Take 10 mg by mouth daily.        Marland Kitchen Cod Liver Oil CAPS Take 1 capsule by mouth daily.        . cyanocobalamin (,VITAMIN B-12,) 1000 MCG/ML injection Inject 1,000 mcg into the muscle every 30 (thirty) days.        . ferrous sulfate 325 (65 FE) MG tablet Take 325 mg by mouth daily.        Marland Kitchen HYDROcodone-acetaminophen (VICODIN) 5-500 MG per tablet Take 1 tablet by mouth 2 (two) times daily as needed. For pain.        Review of Systems Review of Systems  Constitutional: Negative for diaphoresis and unexpected weight change.  HENT: Negative for drooling and tinnitus.   Eyes: Negative for photophobia and visual disturbance.  Respiratory: Negative for choking and stridor.   Gastrointestinal: Negative for vomiting and blood  in stool.  Genitourinary: Negative for hematuria and decreased urine volume.      Objective:   Physical Exam BP 106/70  Pulse 56  Temp(Src) 98.5 F (36.9 C) (Oral)  Ht 5\' 4"  (1.626 m)  Wt 243 lb 4 oz (110.337 kg)  BMI 41.75 kg/m2  SpO2 96% Physical Exam  VS noted Constitutional: Pt appears well-developed and well-nourished.  HENT: Head: Normocephalic.  Right Ear: External ear normal.  Left Ear: External ear normal.  Eyes: Conjunctivae and EOM are normal. Pupils are equal, round, and reactive to light.  Neck: Normal range of motion. Neck supple.  Cardiovascular: Normal rate and regular rhythm.   Pulmonary/Chest: Effort normal and breath sounds normal.  Abd:  Soft, NT, non-distended, + BS Neurological: Pt is alert. No cranial nerve deficit. motor/sens/dtr intact to UE's Skin: Skin is warm. No erythema.  Psychiatric: Pt behavior is normal. Thought content normal.  Spine nontender    Assessment & Plan:

## 2011-11-16 NOTE — Patient Instructions (Signed)
Take all new medications as prescribed Continue all other medications as before Please call your pharmacy if you need further refills No lab work needed today You will be contacted regarding the referral for: colonoscopy Please return in 6 months, or sooner if needed

## 2011-11-20 ENCOUNTER — Encounter: Payer: Self-pay | Admitting: Internal Medicine

## 2011-11-20 NOTE — Assessment & Plan Note (Signed)
stable overall by hx and exam, most recent data reviewed with pt, and pt to continue medical treatment as before BP Readings from Last 3 Encounters:  11/16/11 106/70  09/27/11 132/78  09/26/11 119/44

## 2011-11-20 NOTE — Assessment & Plan Note (Signed)
Also for b12 IM today,  to f/u any worsening symptoms or concerns  

## 2011-11-20 NOTE — Assessment & Plan Note (Signed)
stable overall by hx and exam, most recent data reviewed with pt, and pt to continue medical treatment as before  Lab Results  Component Value Date   HGBA1C 6.1 06/07/2011    

## 2011-11-20 NOTE — Assessment & Plan Note (Deleted)
stable overall by hx and exam, most recent data reviewed with pt, and pt to continue medical treatment as before, also for cbc

## 2011-12-22 ENCOUNTER — Telehealth: Payer: Self-pay | Admitting: *Deleted

## 2011-12-22 NOTE — Telephone Encounter (Signed)
Request call bck from nurse. Need to get a updated letter for school. MD gave letter back in 2009... 12/22/11@4 :39pm/LMB

## 2011-12-22 NOTE — Telephone Encounter (Signed)
I printed off 3 letters done for her in 2009 and gave to robin  Robin to ask pt which letter she needs updated, ok to do so

## 2011-12-23 NOTE — Telephone Encounter (Signed)
Completed letter patient has requested and mailed a copy to her home.

## 2011-12-23 NOTE — Telephone Encounter (Signed)
Called left message to call back 

## 2012-03-14 ENCOUNTER — Ambulatory Visit (INDEPENDENT_AMBULATORY_CARE_PROVIDER_SITE_OTHER): Payer: Medicare Other

## 2012-03-14 DIAGNOSIS — E538 Deficiency of other specified B group vitamins: Secondary | ICD-10-CM

## 2012-03-14 DIAGNOSIS — Z111 Encounter for screening for respiratory tuberculosis: Secondary | ICD-10-CM

## 2012-03-14 MED ORDER — CYANOCOBALAMIN 1000 MCG/ML IJ SOLN
1000.0000 ug | Freq: Once | INTRAMUSCULAR | Status: AC
  Administered 2012-03-14: 1000 ug via INTRAMUSCULAR

## 2012-03-16 ENCOUNTER — Encounter: Payer: Self-pay | Admitting: Internal Medicine

## 2012-03-16 ENCOUNTER — Telehealth: Payer: Self-pay

## 2012-03-16 NOTE — Telephone Encounter (Signed)
Technically she is correct, though she has had blood sugar in the past > 110  We have changed the diagnosis to "impaired glucose intolerance" which means very mild elev blood sugar without DM

## 2012-03-16 NOTE — Telephone Encounter (Signed)
Pt called stating that she picked up some paperwork from office today and when reviewing it she noticed a Dx for DMII. Pt says that she was never told that she was a diabetic and never treated, please advise.

## 2012-03-16 NOTE — Telephone Encounter (Signed)
Called left message to call back 

## 2012-03-19 NOTE — Telephone Encounter (Signed)
Called the patient informed of information.  The patient will bring a new form in for the  MD to complete with correct diagnosis.

## 2012-03-20 ENCOUNTER — Ambulatory Visit (INDEPENDENT_AMBULATORY_CARE_PROVIDER_SITE_OTHER): Payer: Medicare Other | Admitting: Internal Medicine

## 2012-03-20 ENCOUNTER — Encounter: Payer: Self-pay | Admitting: Internal Medicine

## 2012-03-20 VITALS — BP 118/70 | HR 54 | Temp 97.5°F | Ht 64.0 in | Wt 242.0 lb

## 2012-03-20 DIAGNOSIS — M542 Cervicalgia: Secondary | ICD-10-CM

## 2012-03-20 DIAGNOSIS — G8929 Other chronic pain: Secondary | ICD-10-CM

## 2012-03-20 DIAGNOSIS — I10 Essential (primary) hypertension: Secondary | ICD-10-CM

## 2012-03-20 DIAGNOSIS — M899 Disorder of bone, unspecified: Secondary | ICD-10-CM

## 2012-03-20 DIAGNOSIS — R7309 Other abnormal glucose: Secondary | ICD-10-CM

## 2012-03-20 DIAGNOSIS — M8580 Other specified disorders of bone density and structure, unspecified site: Secondary | ICD-10-CM

## 2012-03-20 DIAGNOSIS — R7302 Impaired glucose tolerance (oral): Secondary | ICD-10-CM

## 2012-03-20 MED ORDER — ATENOLOL 50 MG PO TABS: 50.0000 mg | ORAL_TABLET | Freq: Every day | ORAL | Status: DC

## 2012-03-20 MED ORDER — ALENDRONATE SODIUM 70 MG PO TABS: 70.0000 mg | ORAL_TABLET | ORAL | Status: AC

## 2012-03-20 NOTE — Assessment & Plan Note (Signed)
stable overall by hx and exam, most recent data reviewed with pt, and pt to continue medical treatment as before  Lab Results  Component Value Date   HGBA1C 6.1 06/07/2011    

## 2012-03-20 NOTE — Patient Instructions (Signed)
Please re-start the fosamax for one more year Your next bone density will be due about august 2014 or after Continue all other medications as before, including the atenolol (refilled today) Your form was filled out today

## 2012-03-20 NOTE — Assessment & Plan Note (Signed)
.  stable overall by hx and exam, most recent data reviewed with pt, and pt to continue medical treatment as before, no worsening neuro changes today

## 2012-03-20 NOTE — Progress Notes (Signed)
Subjective:    Patient ID: [name], female    DOB: 04-06-1941, 71 y.o.   MRN: [id number]  HPI  Here to f/u; overall doing ok,  Pt denies chest pain, increased sob or doe, wheezing, orthopnea, PND, increased LE swelling, palpitations, dizziness or syncope.  Pt denies new neurological symptoms such as new headache, or facial or extremity weakness or numbness   Pt denies polydipsia, polyuria  Pt states overall good compliance with meds, trying to follow lower cholesterol diet, wt overall stable but little exercise however.  Needs form filled out again to become a foster parent  Chronic neck pain no change. We did review the latest bone density with marked osteopenia with worst t-score -2.4, has taken fosamax about 4 yrs total of the last 7 yrs.  No fractures.   Past Medical History  Diagnosis Date  . Hx of colonic polyps   . Chronic headaches     Chronic pain  . Hypothyroidism   . Hyperlipidemia   . Anemia     NOS  . DJD (degenerative joint disease)     Spine, hands  . Asthma   . Hearing loss   . Sickle cell trait   . Migraine   . Peptic ulcer disease   . Allergic rhinitis   . DDD (degenerative disc disease), cervical   . CTS (carpal tunnel syndrome) 8/08    Right, severe, emg/ncs; Left, moderate, emg/ncs  . Stenosing tenosynovitis of thumb     Left  . Hypertension   . Diabetes mellitus     Type II-diet  . Vitamin d deficiency   . Peripheral neuropathy   . Disc disease, degenerative, cervical 06/07/2011  . Chronic neck pain 06/07/2011  . B12 deficiency 09/27/2011   Past Surgical History  Procedure Date  . Knee surgery   . Abdominal hysterectomy     reports that she has never smoked. She does not have any smokeless tobacco history on file. She reports that she does not drink alcohol or use illicit drugs. family history includes Arthritis in her other; Diabetes in her mother and other; and Hyperlipidemia in her other. Allergies  Allergen Reactions  . Naproxen    Current  Outpatient Prescriptions on File Prior to Visit  Medication Sig Dispense Refill  . albuterol (PROVENTIL HFA;VENTOLIN HFA) 108 (90 BASE) MCG/ACT inhaler Inhale 2 puffs into the lungs every 6 (six) hours as needed for wheezing.  1 Inhaler  11  . ascorbic acid (VITAMIN C) 500 MG tablet Take 500 mg by mouth daily.        . calcium-vitamin D (OSCAL WITH D) 500-200 MG-UNIT per tablet Take 2 tablets by mouth 2 (two) times daily.        . cetirizine (ZYRTEC) 10 MG tablet Take 10 mg by mouth daily.        Marland Kitchen Cod Liver Oil CAPS Take 1 capsule by mouth daily.        . cyanocobalamin (,VITAMIN B-12,) 1000 MCG/ML injection Inject 1,000 mcg into the muscle every 30 (thirty) days.        . ferrous sulfate 325 (65 FE) MG tablet Take 325 mg by mouth daily.        Marland Kitchen DISCONTD: atenolol (TENORMIN) 50 MG tablet TAKE ONE TABLET BY MOUTH EVERY DAY  90 tablet  3  . aspirin 81 MG EC tablet Take 81 mg by mouth daily.        Marland Kitchen HYDROcodone-acetaminophen (VICODIN) 5-500 MG per tablet Take 1 tablet  by mouth 2 (two) times daily as needed. For pain.        Review of Systems Review of Systems  Constitutional: Negative for diaphoresis and unexpected weight change.  Eyes: Negative for photophobia and visual disturbance.  Respiratory: Negative for choking and stridor.   Gastrointestinal: Negative for vomiting and blood in stool.  Genitourinary: Negative for hematuria and decreased urine volume.  Musculoskeletal: Negative for gait problem.  Skin: Negative for color change and wound.  Neurological: Negative for tremors and numbness.     Objective:   Physical Exam .vvs Physical Exam  VS noted Constitutional: Pt appears well-developed and well-nourished.  HENT: Head: Normocephalic.  Right Ear: External ear normal.  Left Ear: External ear normal.  Eyes: Conjunctivae and EOM are normal. Pupils are equal, round, and reactive to light.  Neck: Normal range of motion. Neck supple.  Cardiovascular: Normal rate and regular rhythm.    Pulmonary/Chest: Effort normal and breath sounds normal.  Neurological: Pt is alert. Not confused, motor/gait intact throughout Skin: Skin is warm. No erythema.  Psychiatric: Pt behavior is normal. Thought content normal. not depressed or nervous today    Assessment & Plan:

## 2012-03-20 NOTE — Assessment & Plan Note (Signed)
Near osteoporosis, has been on fosamax off and on for approx 4 yrs out of the last 7 - will plan on one more yr, then stop, with f/u dxa about aug 2014

## 2012-03-20 NOTE — Assessment & Plan Note (Signed)
stable overall by hx and exam, most recent data reviewed with pt, and pt to continue medical treatment as before BP Readings from Last 3 Encounters:  03/20/12 118/70  11/16/11 106/70  09/27/11 132/78

## 2012-04-02 ENCOUNTER — Encounter: Payer: Self-pay | Admitting: Internal Medicine

## 2012-05-15 ENCOUNTER — Encounter: Payer: Self-pay | Admitting: Internal Medicine

## 2012-05-15 ENCOUNTER — Ambulatory Visit (INDEPENDENT_AMBULATORY_CARE_PROVIDER_SITE_OTHER): Payer: Medicare Other | Admitting: Internal Medicine

## 2012-05-15 ENCOUNTER — Other Ambulatory Visit (INDEPENDENT_AMBULATORY_CARE_PROVIDER_SITE_OTHER): Payer: Medicare Other

## 2012-05-15 VITALS — BP 122/80 | HR 56 | Temp 98.4°F | Ht 64.0 in | Wt 244.2 lb

## 2012-05-15 DIAGNOSIS — E785 Hyperlipidemia, unspecified: Secondary | ICD-10-CM

## 2012-05-15 DIAGNOSIS — M542 Cervicalgia: Secondary | ICD-10-CM

## 2012-05-15 DIAGNOSIS — R7309 Other abnormal glucose: Secondary | ICD-10-CM

## 2012-05-15 DIAGNOSIS — R7302 Impaired glucose tolerance (oral): Secondary | ICD-10-CM

## 2012-05-15 DIAGNOSIS — E039 Hypothyroidism, unspecified: Secondary | ICD-10-CM

## 2012-05-15 DIAGNOSIS — D509 Iron deficiency anemia, unspecified: Secondary | ICD-10-CM

## 2012-05-15 DIAGNOSIS — E538 Deficiency of other specified B group vitamins: Secondary | ICD-10-CM

## 2012-05-15 DIAGNOSIS — G8929 Other chronic pain: Secondary | ICD-10-CM

## 2012-05-15 LAB — BASIC METABOLIC PANEL
Calcium: 9.2 mg/dL (ref 8.4–10.5)
Creatinine, Ser: 1.3 mg/dL — ABNORMAL HIGH (ref 0.4–1.2)
GFR: 51.98 mL/min — ABNORMAL LOW (ref >60.00)
Sodium: 141 mEq/L (ref 135–145)

## 2012-05-15 LAB — CBC WITH DIFFERENTIAL/PLATELET
Basophils Absolute: 0 10*3/uL (ref 0.0–0.1)
Eosinophils Absolute: 0.2 10*3/uL (ref 0.0–0.7)
Eosinophils Relative: 3.5 % (ref 0.0–5.0)
HCT: 36.4 % (ref 36.0–46.0)
Lymphs Abs: 1.6 10*3/uL (ref 0.7–4.0)
MCV: 89.5 fl (ref 78.0–100.0)
Monocytes Absolute: 0.5 10*3/uL (ref 0.1–1.0)
Neutrophils Relative %: 56.8 % (ref 43.0–77.0)
Platelets: 101 10*3/uL — ABNORMAL LOW (ref 150.0–400.0)
RDW: 13.6 % (ref 11.5–14.6)
WBC: 5.3 10*3/uL (ref 4.5–10.5)

## 2012-05-15 LAB — HEPATIC FUNCTION PANEL
ALT: 14 U/L (ref 0–35)
AST: 21 U/L (ref 0–37)
Albumin: 4.1 g/dL (ref 3.5–5.2)
Alkaline Phosphatase: 78 U/L (ref 39–117)
Total Bilirubin: 0.7 mg/dL (ref 0.3–1.2)

## 2012-05-15 LAB — LIPID PANEL
HDL: 45.6 mg/dL (ref >39.00)
Total CHOL/HDL Ratio: 4
Triglycerides: 122 mg/dL (ref 0.0–149.0)

## 2012-05-15 LAB — HEMOGLOBIN A1C: Hgb A1c MFr Bld: 5.7 % (ref 4.6–6.5)

## 2012-05-15 MED ORDER — CYANOCOBALAMIN 1000 MCG/ML IJ SOLN
1000.0000 ug | Freq: Once | INTRAMUSCULAR | Status: AC
  Administered 2012-05-15: 1000 ug via INTRAMUSCULAR

## 2012-05-15 MED ORDER — HYDROCODONE-ACETAMINOPHEN 5-500 MG PO TABS: 1.0000 | ORAL_TABLET | Freq: Two times a day (BID) | ORAL | Status: DC | PRN

## 2012-05-15 NOTE — Patient Instructions (Addendum)
You had the B12 shot today Continue all other medications as before You are given the pain medication refill today Please go to LAB in the Basement for the blood and/or urine tests to be done today You will be contacted by phone if any changes need to be made immediately.  Otherwise, you will receive a letter about your results with an explanation. Please return in 6 months, or sooner if needed

## 2012-05-19 ENCOUNTER — Encounter: Payer: Self-pay | Admitting: Internal Medicine

## 2012-05-19 NOTE — Assessment & Plan Note (Signed)
stable overall by hx and exam,t, and pt to continue medical treatment as before   

## 2012-05-19 NOTE — Assessment & Plan Note (Signed)
stable overall by hx and exam, most recent data reviewed with pt, and pt to continue medical treatment as before Lab Results  Component Value Date   HGBA1C 5.7 05/15/2012    

## 2012-05-19 NOTE — Assessment & Plan Note (Signed)
stable overall by hx and exam, most recent data reviewed with pt, and pt to continue medical treatment as before Lab Results  Component Value Date   WBC 5.3 05/15/2012   HGB 12.2 05/15/2012   HCT 36.4 05/15/2012   MCV 89.5 05/15/2012   PLT 101.0* 05/15/2012

## 2012-05-19 NOTE — Assessment & Plan Note (Signed)
For b12 IM today 

## 2012-05-19 NOTE — Progress Notes (Signed)
Subjective:    Patient ID: [name], female    DOB: 1941/07/14, 71 y.o.   MRN: [id number]  HPI  Here to f/u; overall doing ok,  Pt denies chest pain, increased sob or doe, wheezing, orthopnea, PND, increased LE swelling, palpitations, dizziness or syncope.  Pt denies new neurological symptoms such as new headache, or facial or extremity weakness or numbness   Pt denies polydipsia, polyuria, or low sugar symptoms such as weakness or confusion improved with po intake.  Pt states overall good compliance with meds, trying to follow lower cholesterol diet, wt overall stable but little exercise however.  Denies hyper or hypo thyroid symptoms such as voice, skin or hair change.  Chronic pain overall stable on current meds.  No recent overt bleeding or bruising.  Due for B12 shot, states she would come monthly but is unable to due cost of transportation.  Pt continues to have recurring neck pain without change in severity, bowel or bladder change, fever, wt loss,  worsening LE pain/numbness/weakness, gait change or falls.  Past Medical History  Diagnosis Date  . Hx of colonic polyps   . Chronic headaches     Chronic pain  . Hypothyroidism   . Hyperlipidemia   . Anemia     NOS  . DJD (degenerative joint disease)     Spine, hands  . Asthma   . Hearing loss   . Sickle cell trait   . Migraine   . Peptic ulcer disease   . Allergic rhinitis   . DDD (degenerative disc disease), cervical   . CTS (carpal tunnel syndrome) 8/08    Right, severe, emg/ncs; Left, moderate, emg/ncs  . Stenosing tenosynovitis of thumb     Left  . Hypertension   . Diabetes mellitus     Type II-diet  . Vitamin d deficiency   . Peripheral neuropathy   . Disc disease, degenerative, cervical 06/07/2011  . Chronic neck pain 06/07/2011  . B12 deficiency 09/27/2011   Past Surgical History  Procedure Date  . Knee surgery   . Abdominal hysterectomy     reports that she has never smoked. She does not have any smokeless  tobacco history on file. She reports that she does not drink alcohol or use illicit drugs. family history includes Arthritis in her other; Diabetes in her mother and other; and Hyperlipidemia in her other. Allergies  Allergen Reactions  . Naproxen    Current Outpatient Prescriptions on File Prior to Visit  Medication Sig Dispense Refill  . albuterol (PROVENTIL HFA;VENTOLIN HFA) 108 (90 BASE) MCG/ACT inhaler Inhale 2 puffs into the lungs every 6 (six) hours as needed for wheezing.  1 Inhaler  11  . alendronate (FOSAMAX) 70 MG tablet Take 1 tablet (70 mg total) by mouth every 7 (seven) days. Take with a full glass of water on an empty stomach.  12 tablet  3  . ascorbic acid (VITAMIN C) 500 MG tablet Take 500 mg by mouth daily.        Marland Kitchen aspirin 81 MG EC tablet Take 81 mg by mouth daily.        Marland Kitchen atenolol (TENORMIN) 50 MG tablet Take 1 tablet (50 mg total) by mouth daily.  90 tablet  3  . calcium-vitamin D (OSCAL WITH D) 500-200 MG-UNIT per tablet Take 2 tablets by mouth 2 (two) times daily.        . cetirizine (ZYRTEC) 10 MG tablet Take 10 mg by mouth daily.        Marland Kitchen  Cod Liver Oil CAPS Take 1 capsule by mouth daily.        . cyanocobalamin (,VITAMIN B-12,) 1000 MCG/ML injection Inject 1,000 mcg into the muscle every 30 (thirty) days.        . ferrous sulfate 325 (65 FE) MG tablet Take 325 mg by mouth daily.         Review of Systems Review of Systems  Constitutional: Negative for diaphoresis and unexpected weight change.  HENT: Negative for tinnitus.   Eyes: Negative for photophobia and visual disturbance.  Respiratory: Negative for choking and stridor.   Gastrointestinal: Negative for vomiting and blood in stool.  Genitourinary: Negative for hematuria and decreased urine volume.  Musculoskeletal: Negative for joint swelling Skin: Negative for color change and wound.  Neurological: Negative for tremors and numbness.      Objective:   Physical Exam BP 122/80  Pulse 56  Temp 98.4 F  (36.9 C) (Oral)  Ht 5\' 4"  (1.626 m)  Wt 244 lb 4 oz (110.791 kg)  BMI 41.93 kg/m2  SpO2 97% Physical Exam  VS noted, not ill appearing Constitutional: Pt appears well-developed and well-nourished.  HENT: Head: Normocephalic.  Right Ear: External ear normal.  Left Ear: External ear normal.  Eyes: Conjunctivae and EOM are normal. Pupils are equal, round, and reactive to light.  Neck: Normal range of motion. Neck supple.  Cardiovascular: Normal rate and regular rhythm.   Pulmonary/Chest: Effort normal and breath sounds normal.  Neurological: Pt is alert. Not confused Skin: Skin is warm. No erythema.  Psychiatric: Pt behavior is normal. Thought content normal.     Assessment & Plan:

## 2012-05-19 NOTE — Assessment & Plan Note (Signed)
stable overall by hx and exam, most recent data reviewed with pt, and pt to continue medical treatment as before Lab Results  Component Value Date   LDLCALC 103* 05/15/2012

## 2012-05-19 NOTE — Assessment & Plan Note (Signed)
stable overall by hx and exam, most recent data reviewed with pt, and pt to continue medical treatment as before Lab Results  Component Value Date   TSH 4.85 05/15/2012

## 2012-05-22 ENCOUNTER — Telehealth: Payer: Self-pay

## 2012-05-22 MED ORDER — HYDROCODONE-ACETAMINOPHEN 5-500 MG PO TABS: 1.0000 | ORAL_TABLET | Freq: Two times a day (BID) | ORAL | Status: DC | PRN

## 2012-05-22 NOTE — Telephone Encounter (Signed)
Called the patient back and she did not get a prescription on July 2 for hydrocodone and stated the pharmacy did not have either. Please advise

## 2012-05-22 NOTE — Telephone Encounter (Signed)
Faxed hardcopy to [facility] per patient request and patient was called and informed prescription sent in.

## 2012-05-22 NOTE — Telephone Encounter (Signed)
The patient called requesting a refill on her vicodin, states she did not get refill at last OV and she is out.  Please advise call back number is [phone]

## 2012-05-22 NOTE — Telephone Encounter (Signed)
Caller: Tomika/Patient is calling with a question about Vicodan. The medication was written by Oliver Barre. Seen 05/15/12. Expected refill of Vicodan; last filled on 10/12.   Was told by office staff to check office papers to see if RX was given; she did not find any paper RX for Vicodan.  Also checked with [facility] at [place] and they did not receive the RX. Info noted and sent to MD for requesting refill of RX med with valid refills per Med Questions Call Guideline. OFFICE: please call her back to advise if RX needs to be picked up at office or if it has been called to pharmacy.

## 2012-05-22 NOTE — Telephone Encounter (Signed)
Ok for rx re-done -

## 2012-05-22 NOTE — Telephone Encounter (Signed)
Already done May 15, 2012

## 2012-08-07 ENCOUNTER — Encounter: Payer: Self-pay | Admitting: Gastroenterology

## 2012-08-08 ENCOUNTER — Encounter: Payer: Self-pay | Admitting: Gastroenterology

## 2012-09-11 ENCOUNTER — Encounter: Payer: Self-pay | Admitting: Gastroenterology

## 2012-09-11 ENCOUNTER — Ambulatory Visit: Payer: Medicare Other | Admitting: Gastroenterology

## 2012-09-11 ENCOUNTER — Ambulatory Visit (INDEPENDENT_AMBULATORY_CARE_PROVIDER_SITE_OTHER): Payer: Medicare Other | Admitting: Gastroenterology

## 2012-09-11 VITALS — BP 124/70 | HR 64 | Wt 248.4 lb

## 2012-09-11 DIAGNOSIS — Z1211 Encounter for screening for malignant neoplasm of colon: Secondary | ICD-10-CM

## 2012-09-11 NOTE — Patient Instructions (Addendum)
Since you have never had colon polyps, you do not need another screening colonoscopy until 07/2017. If any changes in your bowels (worsening constipation, bleeding, other serious changes) then please call here. A copy of this information will be made available to  Dr. Jonny Ruiz. We will print a copy of your 2008 colonoscopy for your own records.

## 2012-09-11 NOTE — Progress Notes (Signed)
Review of pertinent gastrointestinal problems: 1. Colonoscopy  September 2008 found no colon polyps. She did have diverticulosis and some small hemorrhoids. There was question about whether she had precancerous polyps that were removed from her colon.   HPI: This is a     very pleasant 71 year old woman whom I last saw the time of a colonoscopy at [facility]. See those results summarized above.  She really doesn't remember that colonoscopy. I reviewed with her in am going to cannot copy for it as her correct name, her corrected at birth and also I sent a copy to her primary care physician who she knows very well.  She says she has never had colon polyps removed from her colon and she is very clear about that. She really has no significant changes in her bowels in fact her constipation from a few years ago has improved with dietary manipulation. She has no rectal bleeding.    Review of systems: Pertinent positive and negative review of systems were noted in the above HPI section. Complete review of systems was performed and was otherwise normal.    Past Medical History  Diagnosis Date  . Hx of colonic polyps   . Chronic headaches     Chronic pain  . Hypothyroidism   . Hyperlipidemia   . Anemia     NOS  . DJD (degenerative joint disease)     Spine, hands  . Asthma   . Hearing loss   . Sickle cell trait   . Migraine   . Peptic ulcer disease   . Allergic rhinitis   . DDD (degenerative disc disease), cervical   . CTS (carpal tunnel syndrome) 8/08    Right, severe, emg/ncs; Left, moderate, emg/ncs  . Stenosing tenosynovitis of thumb     Left  . Hypertension   . Diabetes mellitus     Type II-diet  . Vitamin D deficiency   . Peripheral neuropathy   . Disc disease, degenerative, cervical 06/07/2011  . Chronic neck pain 06/07/2011  . B12 deficiency 09/27/2011    Past Surgical History  Procedure Date  . Knee surgery   . Abdominal hysterectomy     Current  Outpatient Prescriptions  Medication Sig Dispense Refill  . aspirin 81 MG EC tablet Take 81 mg by mouth daily.        Marland Kitchen atenolol (TENORMIN) 50 MG tablet Take 1 tablet (50 mg total) by mouth daily.  90 tablet  3  . calcium-vitamin D (OSCAL WITH D) 500-200 MG-UNIT per tablet Take 1 tablet by mouth 2 (two) times daily.       Marland Kitchen Cod Liver Oil CAPS Take 1 capsule by mouth daily.        . cyanocobalamin (,VITAMIN B-12,) 1000 MCG/ML injection Inject 1,000 mcg into the muscle every 30 (thirty) days.        . ferrous sulfate 325 (65 FE) MG tablet Take 325 mg by mouth daily.        Marland Kitchen albuterol (PROVENTIL HFA;VENTOLIN HFA) 108 (90 BASE) MCG/ACT inhaler Inhale 2 puffs into the lungs every 6 (six) hours as needed for wheezing.  1 Inhaler  11  . alendronate (FOSAMAX) 70 MG tablet Take 1 tablet (70 mg total) by mouth every 7 (seven) days. Take with a full glass of water on an empty stomach.  12 tablet  3  . cetirizine (ZYRTEC) 10 MG tablet Take 10 mg by mouth daily.        Marland Kitchen HYDROcodone-acetaminophen (VICODIN) 5-500  MG per tablet Take 1 tablet by mouth 2 (two) times daily as needed. For pain.  60 tablet  2    Allergies as of 09/11/2012 - Review Complete 09/11/2012  Allergen Reaction Noted  . Lactose intolerance (gi)  09/11/2012  . Naproxen  05/11/2009    Family History  Problem Relation Age of Onset  . Diabetes Mother     DM  . Arthritis Other   . Hyperlipidemia Other   . Diabetes Other     1st degree relative    History   Social History  . Marital Status: Single    Spouse Name: N/A    Number of Children: 3  . Years of Education: N/A   Occupational History  . Nurse     [employer]   Social History Main Topics  . Smoking status: Never Smoker   . Smokeless tobacco: Never Used  . Alcohol Use: No  . Drug Use: No  . Sexually Active: Not on file   Other Topics Concern  . Not on file   Social History Narrative   Divorced.       Physical Exam: BP 124/70  Pulse 64  Wt 248  lb 6.4 oz (112.674 kg) Constitutional: generally well-appearing Psychiatric: alert and oriented x3 Eyes: extraocular movements intact Mouth: oral pharynx moist, no lesions Neck: supple no lymphadenopathy Cardiovascular: heart regular rate and rhythm Lungs: clear to auscultation bilaterally Abdomen: soft, nontender, nondistended, no obvious ascites, no peritoneal signs, normal bowel sounds Extremities: no lower extremity edema bilaterally Skin: no lesions on visible extremities    Assessment and plan: 70 y.o. female with  routine risk for colon cancer  I do not see that she colon cancer screening at this point. We'll put her in for repeat colonoscopy at 5 year interval from now which is usual for routine risk patients.

## 2012-09-17 ENCOUNTER — Encounter: Payer: Self-pay | Admitting: Internal Medicine

## 2012-09-17 ENCOUNTER — Ambulatory Visit (INDEPENDENT_AMBULATORY_CARE_PROVIDER_SITE_OTHER): Payer: Medicare Other | Admitting: Internal Medicine

## 2012-09-17 VITALS — BP 120/70 | HR 62 | Temp 98.3°F | Ht 64.0 in | Wt 247.1 lb

## 2012-09-17 DIAGNOSIS — L538 Other specified erythematous conditions: Secondary | ICD-10-CM

## 2012-09-17 DIAGNOSIS — E538 Deficiency of other specified B group vitamins: Secondary | ICD-10-CM

## 2012-09-17 DIAGNOSIS — J309 Allergic rhinitis, unspecified: Secondary | ICD-10-CM

## 2012-09-17 DIAGNOSIS — L304 Erythema intertrigo: Secondary | ICD-10-CM

## 2012-09-17 DIAGNOSIS — Z23 Encounter for immunization: Secondary | ICD-10-CM

## 2012-09-17 DIAGNOSIS — L732 Hidradenitis suppurativa: Secondary | ICD-10-CM

## 2012-09-17 DIAGNOSIS — I10 Essential (primary) hypertension: Secondary | ICD-10-CM

## 2012-09-17 MED ORDER — LEVOCETIRIZINE DIHYDROCHLORIDE 5 MG PO TABS: 5.0000 mg | ORAL_TABLET | Freq: Every evening | ORAL | Status: DC

## 2012-09-17 MED ORDER — CYANOCOBALAMIN 1000 MCG/ML IJ SOLN
1000.0000 ug | Freq: Once | INTRAMUSCULAR | Status: AC
  Administered 2012-09-17: 1000 ug via INTRAMUSCULAR

## 2012-09-17 MED ORDER — DOXYCYCLINE HYCLATE 100 MG PO TABS: 100.0000 mg | ORAL_TABLET | Freq: Two times a day (BID) | ORAL | Status: DC

## 2012-09-17 MED ORDER — NYSTATIN 100000 UNIT/GM EX POWD: CUTANEOUS | Status: DC

## 2012-09-17 NOTE — Assessment & Plan Note (Signed)
stable overall by hx and exam, most recent data reviewed with pt, and pt to continue medical treatment as before BP Readings from Last 3 Encounters:  09/17/12 120/70  09/11/12 124/70  05/15/12 122/80

## 2012-09-17 NOTE — Assessment & Plan Note (Signed)
Zyrtec too expensive, for generic xyzal asd,  to f/u any worsening symptoms or concerns

## 2012-09-17 NOTE — Progress Notes (Signed)
Subjective:    Patient ID: [name], female    DOB: [birth year], 71 y.o.   MRN: [id number]  HPI  Here with c/o 2-3 days onset bilat right > left axillary tender lumps with ? Low grade temp, but no drainage.  Has had similar lumps in the past on right, minor and resolved.  Pt denies chest pain, increased sob or doe, wheezing, orthopnea, PND, increased LE swelling, palpitations, dizziness or syncope.   Pt denies polydipsia, polyuria  Pt states overall good compliance with meds, trying to follow lower cholesterol diet, wt overall stable but little exercise however.  Nothing makes the lumps better or worse.  Does also have rash below the breasts which are pendulous. Due for b12 today, as well as flu shot.  Trying to get here monthly for b12 shots, but does not always have transportatin.  Does have several wks ongoing nasal allergy symptoms with clear congestion, itch and sneeze, without fever, pain, ST, cough or wheezing, but zyrtec is too expensive.   Past Medical History  Diagnosis Date  . Hx of colonic polyps   . Chronic headaches     Chronic pain  . Hypothyroidism   . Hyperlipidemia   . Anemia     NOS  . DJD (degenerative joint disease)     Spine, hands  . Asthma   . Hearing loss   . Sickle cell trait   . Migraine   . Peptic ulcer disease   . Allergic rhinitis   . DDD (degenerative disc disease), cervical   . CTS (carpal tunnel syndrome) 8/08    Right, severe, emg/ncs; Left, moderate, emg/ncs  . Stenosing tenosynovitis of thumb     Left  . Hypertension   . Diabetes mellitus     Type II-diet  . Vitamin D deficiency   . Peripheral neuropathy   . Disc disease, degenerative, cervical 06/07/2011  . Chronic neck pain 06/07/2011  . B12 deficiency 09/27/2011   Past Surgical History  Procedure Date  . Knee surgery   . Abdominal hysterectomy     reports that she has never smoked. She has never used smokeless tobacco. She reports that she does not drink alcohol or use illicit  drugs. family history includes Arthritis in her other; Diabetes in her mother and other; and Hyperlipidemia in her other. Allergies  Allergen Reactions  . Lactose Intolerance (Gi)   . Naproxen    Current Outpatient Prescriptions on File Prior to Visit  Medication Sig Dispense Refill  . albuterol (PROVENTIL HFA;VENTOLIN HFA) 108 (90 BASE) MCG/ACT inhaler Inhale 2 puffs into the lungs every 6 (six) hours as needed for wheezing.  1 Inhaler  11  . aspirin 81 MG EC tablet Take 81 mg by mouth daily.        Marland Kitchen atenolol (TENORMIN) 50 MG tablet Take 1 tablet (50 mg total) by mouth daily.  90 tablet  3  . calcium-vitamin D (OSCAL WITH D) 500-200 MG-UNIT per tablet Take 1 tablet by mouth 2 (two) times daily.       . cetirizine (ZYRTEC) 10 MG tablet Take 10 mg by mouth daily.        Marland Kitchen Cod Liver Oil CAPS Take 1 capsule by mouth daily.        . cyanocobalamin (,VITAMIN B-12,) 1000 MCG/ML injection Inject 1,000 mcg into the muscle every 30 (thirty) days.        . ferrous sulfate 325 (65 FE) MG tablet Take 325 mg by mouth daily.        Marland Kitchen  HYDROcodone-acetaminophen (VICODIN) 5-500 MG per tablet Take 1 tablet by mouth 2 (two) times daily as needed. For pain.  60 tablet  2  . alendronate (FOSAMAX) 70 MG tablet Take 1 tablet (70 mg total) by mouth every 7 (seven) days. Take with a full glass of water on an empty stomach.  12 tablet  3  . levocetirizine (XYZAL) 5 MG tablet Take 1 tablet (5 mg total) by mouth every evening.  90 tablet  3   No current facility-administered medications on file prior to visit.   Review of Systems  Constitutional: Negative for diaphoresis and unexpected weight change.  HENT: Negative for tinnitus.   Eyes: Negative for photophobia and visual disturbance.  Respiratory: Negative for choking and stridor.   Gastrointestinal: Negative for vomiting and blood in stool.  Genitourinary: Negative for hematuria and decreased urine volume.  Musculoskeletal: Negative for gait problem.  Skin:  Negative for color change and wound.  Neurological: Negative for tremors and numbness.  Psychiatric/Behavioral: Negative for decreased concentration. The patient is not hyperactive.       Objective:   Physical Exam BP 120/70  Pulse 62  Temp 98.3 F (36.8 C) (Oral)  Ht 5\' 4"  (1.626 m)  Wt 247 lb 2 oz (112.095 kg)  BMI 42.42 kg/m2  SpO2 97% Physical Exam  VS noted Constitutional: Pt appears well-developed and well-nourished.  HENT: Head: Normocephalic.  Right Ear: External ear normal.  Left Ear: External ear normal.  Eyes: Conjunctivae and EOM are normal. Pupils are equal, round, and reactive to light.  Neck: Normal range of motion. Neck supple.  Cardiovascular: Normal rate and regular rhythm.   Pulmonary/Chest: Effort normal and breath sounds normal.  Abd:  Soft, NT, non-distended, + BS Neurological: Pt is alert. Not confused  Skin: Skin is warm. No erythema. except for diffuse nontender erythema below breasts typical of candida Also, right > left axillas with tender subq nodular lumps < 1 cm, tedner, nondraining, nonfluctuant Psychiatric: Pt behavior is normal. Thought content normal.      Assessment & Plan:

## 2012-09-17 NOTE — Patient Instructions (Addendum)
You had the flu shot, and B12 shot today OK to stop the Zyrtec if too expensive, and I sent a new medication called Xyzal (generic) to [facility] place Take all new medications as prescribed - the antibiotic, and the nystatin powder Continue all other medications as before, including the atenolol and the alendronate (remember BOTH of these can be paid for with CASH under the [facility] $4 list) Please return Jan 2 as planned

## 2012-09-17 NOTE — Assessment & Plan Note (Signed)
For b12 IM today, to f/u any worsening symptoms or concerns  

## 2012-09-17 NOTE — Assessment & Plan Note (Signed)
Mild to mod, for antibx course,  to f/u any worsening symptoms or concerns 

## 2012-09-18 ENCOUNTER — Telehealth: Payer: Self-pay

## 2012-09-18 MED ORDER — NYSTATIN 100000 UNIT/GM EX CREA: TOPICAL_CREAM | Freq: Two times a day (BID) | CUTANEOUS | Status: DC

## 2012-09-18 NOTE — Telephone Encounter (Signed)
rx changed to cream - done erx

## 2012-09-18 NOTE — Telephone Encounter (Signed)
Called the pharmacist and he stated the nystatin cream 30 gm would be only $4.

## 2012-09-18 NOTE — Telephone Encounter (Signed)
Pt called stating Nystatin is too expensive at $200+. Pt is requesting a cheaper alternative, please advise

## 2012-09-18 NOTE — Telephone Encounter (Signed)
Robin  To Please ask pharmacist for idea for similar tx that is less expensive

## 2012-09-19 NOTE — Telephone Encounter (Signed)
Patient informed. 

## 2012-11-15 ENCOUNTER — Encounter: Payer: Self-pay | Admitting: Internal Medicine

## 2012-11-15 ENCOUNTER — Ambulatory Visit (INDEPENDENT_AMBULATORY_CARE_PROVIDER_SITE_OTHER): Payer: Medicare Other | Admitting: Internal Medicine

## 2012-11-15 VITALS — BP 122/62 | HR 61 | Temp 98.5°F | Ht 64.0 in | Wt 252.2 lb

## 2012-11-15 DIAGNOSIS — E538 Deficiency of other specified B group vitamins: Secondary | ICD-10-CM

## 2012-11-15 DIAGNOSIS — M7712 Lateral epicondylitis, left elbow: Secondary | ICD-10-CM | POA: Insufficient documentation

## 2012-11-15 DIAGNOSIS — Z0000 Encounter for general adult medical examination without abnormal findings: Secondary | ICD-10-CM

## 2012-11-15 DIAGNOSIS — M7710 Lateral epicondylitis, unspecified elbow: Secondary | ICD-10-CM

## 2012-11-15 DIAGNOSIS — I10 Essential (primary) hypertension: Secondary | ICD-10-CM

## 2012-11-15 DIAGNOSIS — M7542 Impingement syndrome of left shoulder: Secondary | ICD-10-CM | POA: Insufficient documentation

## 2012-11-15 DIAGNOSIS — L732 Hidradenitis suppurativa: Secondary | ICD-10-CM

## 2012-11-15 MED ORDER — DOXYCYCLINE HYCLATE 100 MG PO TABS: 100.0000 mg | ORAL_TABLET | Freq: Two times a day (BID) | ORAL | Status: DC

## 2012-11-15 MED ORDER — HYDROCODONE-ACETAMINOPHEN 5-325 MG PO TABS: 1.0000 | ORAL_TABLET | Freq: Three times a day (TID) | ORAL | Status: DC | PRN

## 2012-11-15 NOTE — Assessment & Plan Note (Signed)
Mild to mod, for pain control, refer ortho

## 2012-11-15 NOTE — Progress Notes (Signed)
Subjective:    Patient ID: [name], female    DOB: 20-Feb-1941, 72 y.o.   MRN: [id number]  HPI   Here with tender lumps mild to mod to bilat axillary without fever, wt loss but tender/red; better with doxy before but now recurred, not better with anything.,  Pt denies chest pain, increased sob or doe, wheezing, orthopnea, PND, increased LE swelling, palpitations, dizziness or syncope.  Pt denies new neurological symptoms such as new headache, or facial or extremity weakness or numbness   Pt denies polydipsia, polyuria.   Getting new phone equipment for the home later this wk for the hearing impaired, wears hearing aids during the day faithfully.  Also with tender/pain to left shoulder, hard to reach overhead but no arm drop or giveaways.  Has tender outside left lateral elbow mild for several wks.  Pt denies new neurological symptoms such as new headache, or facial or extremity weakness or numbness   Past Medical History  Diagnosis Date  . Hx of colonic polyps   . Chronic headaches     Chronic pain  . Hypothyroidism   . Hyperlipidemia   . Anemia     NOS  . DJD (degenerative joint disease)     Spine, hands  . Asthma   . Hearing loss   . Sickle cell trait   . Migraine   . Peptic ulcer disease   . Allergic rhinitis   . DDD (degenerative disc disease), cervical   . CTS (carpal tunnel syndrome) 8/08    Right, severe, emg/ncs; Left, moderate, emg/ncs  . Stenosing tenosynovitis of thumb     Left  . Hypertension   . Diabetes mellitus     Type II-diet  . Vitamin D deficiency   . Peripheral neuropathy   . Disc disease, degenerative, cervical 06/07/2011  . Chronic neck pain 06/07/2011  . B12 deficiency 09/27/2011   Past Surgical History  Procedure Date  . Knee surgery   . Abdominal hysterectomy     reports that she has never smoked. She has never used smokeless tobacco. She reports that she does not drink alcohol or use illicit drugs. family history includes Arthritis in her other;  Diabetes in her mother and other; and Hyperlipidemia in her other. Allergies  Allergen Reactions  . Lactose Intolerance (Gi)   . Naproxen    Current Outpatient Prescriptions on File Prior to Visit  Medication Sig Dispense Refill  . alendronate (FOSAMAX) 70 MG tablet Take 1 tablet (70 mg total) by mouth every 7 (seven) days. Take with a full glass of water on an empty stomach.  12 tablet  3  . aspirin 81 MG EC tablet Take 81 mg by mouth daily.        Marland Kitchen atenolol (TENORMIN) 50 MG tablet Take 1 tablet (50 mg total) by mouth daily.  90 tablet  3  . calcium-vitamin D (OSCAL WITH D) 500-200 MG-UNIT per tablet Take 1 tablet by mouth 2 (two) times daily.       . cetirizine (ZYRTEC) 10 MG tablet Take 10 mg by mouth daily.        Marland Kitchen Cod Liver Oil CAPS Take 1 capsule by mouth daily.        . cyanocobalamin (,VITAMIN B-12,) 1000 MCG/ML injection Inject 1,000 mcg into the muscle every 30 (thirty) days.        . ferrous sulfate 325 (65 FE) MG tablet Take 325 mg by mouth daily.        Marland Kitchen  levocetirizine (XYZAL) 5 MG tablet Take 1 tablet (5 mg total) by mouth every evening.  90 tablet  3  . nystatin cream (MYCOSTATIN) Apply topically 2 (two) times daily.  30 g  1  . albuterol (PROVENTIL HFA;VENTOLIN HFA) 108 (90 BASE) MCG/ACT inhaler Inhale 2 puffs into the lungs every 6 (six) hours as needed for wheezing.  1 Inhaler  11   Review of Systems  Constitutional: Negative for diaphoresis and unexpected weight change.  HENT: Negative for tinnitus.   Eyes: Negative for photophobia and visual disturbance.  Respiratory: Negative for choking and stridor.   Gastrointestinal: Negative for vomiting and blood in stool.  Genitourinary: Negative for hematuria and decreased urine volume.  Musculoskeletal: Negative for gait problem.  Skin: Negative for color change and wound.  Neurological: Negative for tremors and numbness.  Psychiatric/Behavioral: Negative for decreased concentration. The patient is not hyperactive.        Objective:   Physical Exam BP 122/62  Pulse 61  Temp 98.5 F (36.9 C) (Oral)  Ht 5\' 4"  (1.626 m)  Wt 252 lb 4 oz (114.42 kg)  BMI 43.30 kg/m2  SpO2 96% Physical Exam  VS noted Constitutional: Pt appears well-developed and well-nourished.  HENT: Head: Normocephalic.  Right Ear: External ear normal.  Left Ear: External ear normal.  Eyes: Conjunctivae and EOM are normal. Pupils are equal, round, and reactive to light.  Neck: Normal range of motion. Neck supple.  Cardiovascular: Normal rate and regular rhythm.   Pulmonary/Chest: Effort normal and breath sounds normal.  Left shoulder mod tender to subacromial bursa, tender left lat epicondylar area Neurological: Pt is alert. Not confused , motor intact Skin: bilat axillary tender red subq red areas/nodular without fluctuance or drainage Left shoulder tender left subacromial, and left lateral epicondylar area tender Psychiatric: Pt behavior is normal. Thought content normal.     Assessment & Plan:

## 2012-11-15 NOTE — Assessment & Plan Note (Signed)
Ok for prolonged doxy course,  to f/u any worsening symptoms or concerns

## 2012-11-15 NOTE — Assessment & Plan Note (Addendum)
ECG reviewed as per emr, stable overall by hx and exam, most recent data reviewed with pt, and pt to continue medical treatment as before BP Readings from Last 3 Encounters:  11/15/12 122/62  09/17/12 120/70  09/11/12 124/70

## 2012-11-15 NOTE — Patient Instructions (Addendum)
Take all new medications as prescribed - the hydrocodone, and the longer course of doxycycline Continue all other medications as before Please have the pharmacy call with any other refills you may need. You will be contacted regarding the referral for: orthopedic Your EKG was OK today Please remember to sign up for My Chart at your earliest convenience, as this will be important to you in the future with finding out test results. Please return in 6 mo with Lab testing done 3-5 days before

## 2012-11-15 NOTE — Assessment & Plan Note (Signed)
stable overall by hx and exam, most recent data reviewed with pt, and pt to continue medical treatment as before Lab Results  Component Value Date   HGBA1C 5.7 05/15/2012

## 2012-11-15 NOTE — Assessment & Plan Note (Signed)
Mod to severe, for pain control, refer ortho

## 2012-11-19 ENCOUNTER — Telehealth: Payer: Self-pay | Admitting: Internal Medicine

## 2012-11-19 MED ORDER — DOXYCYCLINE HYCLATE 100 MG PO TABS: 100.0000 mg | ORAL_TABLET | Freq: Two times a day (BID) | ORAL | Status: DC

## 2012-11-19 NOTE — Telephone Encounter (Addendum)
Please let her know, the doxycycline rx should be $4/10 days [facility] at [facility]; gave new rx hardcopy, pt should shop around and pay cash (not use insurance)

## 2012-11-19 NOTE — Telephone Encounter (Signed)
Patient can not afford her doxycycline would like something cheaper called in, call patient when this has been called in

## 2012-11-20 NOTE — Telephone Encounter (Signed)
Pt informed and will pickup hardcopy at her convenience.

## 2012-11-21 ENCOUNTER — Telehealth: Payer: Self-pay | Admitting: Internal Medicine

## 2012-11-21 MED ORDER — SULFAMETHOXAZOLE-TRIMETHOPRIM 800-160 MG PO TABS: 1.0000 | ORAL_TABLET | Freq: Two times a day (BID) | ORAL | Status: DC

## 2012-11-21 NOTE — Telephone Encounter (Signed)
The patient has shopped around and they all are telling her the cost would be $160 and up, please advise as the patient does not know what else to do.

## 2012-11-21 NOTE — Telephone Encounter (Signed)
Patient has checked again with the pharmacy and the $4 medication has been pulled so she can no longer afford the doxycycline, she needs something different sent in that will be cheaper for her

## 2012-11-21 NOTE — Telephone Encounter (Signed)
I checked $4 list   - septra is on it  [facility] for 2 wks septra ds - done erx

## 2012-11-21 NOTE — Telephone Encounter (Signed)
I dont think there could be anything less expensive that would not be more risk (cephalexin may be less expensive but risk of diarrhea such as C Diff is higher).  The next best is septra DS bid but I suspect this would be the same cost, and is not on the $4 list either.  All I can do is suggest checking a different pharmacy such as [facility] or [facility]

## 2012-11-21 NOTE — Telephone Encounter (Signed)
Patient informed. 

## 2013-01-03 ENCOUNTER — Encounter: Payer: Self-pay | Admitting: Physician Assistant

## 2013-05-20 ENCOUNTER — Encounter: Payer: Self-pay | Admitting: Internal Medicine

## 2013-05-20 ENCOUNTER — Encounter: Payer: Self-pay | Admitting: Gastroenterology

## 2013-05-20 ENCOUNTER — Ambulatory Visit (INDEPENDENT_AMBULATORY_CARE_PROVIDER_SITE_OTHER): Payer: Medicare Other | Admitting: Internal Medicine

## 2013-05-20 ENCOUNTER — Other Ambulatory Visit (INDEPENDENT_AMBULATORY_CARE_PROVIDER_SITE_OTHER): Payer: Medicare Other

## 2013-05-20 VITALS — BP 120/70 | HR 55 | Ht 64.0 in | Wt 233.0 lb

## 2013-05-20 DIAGNOSIS — E559 Vitamin D deficiency, unspecified: Secondary | ICD-10-CM

## 2013-05-20 DIAGNOSIS — L732 Hidradenitis suppurativa: Secondary | ICD-10-CM

## 2013-05-20 DIAGNOSIS — IMO0001 Reserved for inherently not codable concepts without codable children: Secondary | ICD-10-CM

## 2013-05-20 DIAGNOSIS — E538 Deficiency of other specified B group vitamins: Secondary | ICD-10-CM

## 2013-05-20 DIAGNOSIS — G894 Chronic pain syndrome: Secondary | ICD-10-CM

## 2013-05-20 DIAGNOSIS — Z0000 Encounter for general adult medical examination without abnormal findings: Secondary | ICD-10-CM

## 2013-05-20 DIAGNOSIS — R7302 Impaired glucose tolerance (oral): Secondary | ICD-10-CM

## 2013-05-20 DIAGNOSIS — R7309 Other abnormal glucose: Secondary | ICD-10-CM

## 2013-05-20 LAB — MICROALBUMIN / CREATININE URINE RATIO: Creatinine,U: 202 mg/dL

## 2013-05-20 LAB — HEPATIC FUNCTION PANEL
ALT: 13 U/L (ref 0–35)
Bilirubin, Direct: 0.1 mg/dL (ref 0.0–0.3)
Total Bilirubin: 0.4 mg/dL (ref 0.3–1.2)

## 2013-05-20 LAB — URINALYSIS, ROUTINE W REFLEX MICROSCOPIC
Bilirubin Urine: NEGATIVE
Nitrite: NEGATIVE
Total Protein, Urine: NEGATIVE
Urine Glucose: NEGATIVE
pH: 6 (ref 5.0–8.0)

## 2013-05-20 LAB — BASIC METABOLIC PANEL
BUN: 11 mg/dL (ref 6–23)
Calcium: 9 mg/dL (ref 8.4–10.5)
Chloride: 107 mEq/L (ref 96–112)
Creatinine, Ser: 1.3 mg/dL — ABNORMAL HIGH (ref 0.4–1.2)
GFR: 53.73 mL/min — ABNORMAL LOW (ref >60.00)

## 2013-05-20 LAB — CBC WITH DIFFERENTIAL/PLATELET
Basophils Relative: 0.3 % (ref 0.0–3.0)
Eosinophils Relative: 4.3 % (ref 0.0–5.0)
Hemoglobin: 11.8 g/dL — ABNORMAL LOW (ref 12.0–15.0)
MCHC: 33.7 g/dL (ref 30.0–36.0)
MCV: 89.2 fl (ref 78.0–100.0)
Monocytes Absolute: 0.4 10*3/uL (ref 0.1–1.0)
Neutro Abs: 2.5 10*3/uL (ref 1.4–7.7)
Neutrophils Relative %: 55.5 % (ref 43.0–77.0)
RBC: 3.92 Mil/uL (ref 3.87–5.11)
WBC: 4.5 10*3/uL (ref 4.5–10.5)

## 2013-05-20 LAB — LIPID PANEL
Cholesterol: 159 mg/dL (ref 0–200)
HDL: 36.1 mg/dL — ABNORMAL LOW (ref >39.00)
LDL Cholesterol: 101 mg/dL — ABNORMAL HIGH (ref 0–99)
Total CHOL/HDL Ratio: 4
Triglycerides: 109 mg/dL (ref 0.0–149.0)
VLDL: 21.8 mg/dL (ref 0.0–40.0)

## 2013-05-20 MED ORDER — HYDROCODONE-ACETAMINOPHEN 5-325 MG PO TABS: 1.0000 | ORAL_TABLET | Freq: Three times a day (TID) | ORAL | Status: DC | PRN

## 2013-05-20 MED ORDER — ALENDRONATE SODIUM 70 MG PO TABS: 70.0000 mg | ORAL_TABLET | ORAL | Status: DC

## 2013-05-20 MED ORDER — DOXYCYCLINE HYCLATE 100 MG PO TABS: 100.0000 mg | ORAL_TABLET | Freq: Two times a day (BID) | ORAL | Status: DC

## 2013-05-20 NOTE — Progress Notes (Addendum)
Subjective:    Patient ID: [name], female    DOB: 1941/05/11, 72 y.o.   MRN: [id number]  HPI Here for wellness and f/u;  Overall doing ok;  Pt denies CP, worsening SOB, DOE, wheezing, orthopnea, PND, worsening LE edema, palpitations, dizziness or syncope.  Pt denies neurological change such as new headache, facial or extremity weakness.  Pt denies polydipsia, polyuria, or low sugar symptoms. Pt states overall good compliance with treatment and medications, good tolerability, and has been trying to follow lower cholesterol diet.  Pt denies worsening depressive symptoms, suicidal ideation or panic. No fever, night sweats, wt loss, loss of appetite, or other constitutional symptoms.  Pt states good ability with ADL's, has low fall risk, home safety reviewed and adequate, no other significant changes in hearing or vision, and only occasionally active with exercise.   Has lost wt intentionally from 252 to 233 today, has some limited finances and eating less lately, Pt continues to have recurring LBP without change in severity, bowel or bladder change, fever, wt loss,  worsening LE pain/numbness/weakness, gait change or falls. Also with left knee chronic pain, has yet to see ortho for either due to cost of copay.  Left shoulder and elbow from last visit somewhat better.  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   Past Medical History  Diagnosis Date  . Hx of colonic polyps   . Chronic headaches     Chronic pain  . Hypothyroidism   . Hyperlipidemia   . Anemia     NOS  . DJD (degenerative joint disease)     Spine, hands  . Asthma   . Hearing loss   . Sickle cell trait   . Migraine   . Peptic ulcer disease   . Allergic rhinitis   . DDD (degenerative disc disease), cervical   . CTS (carpal tunnel syndrome) 8/08    Right, severe, emg/ncs; Left, moderate, emg/ncs  . Stenosing tenosynovitis of thumb     Left  .  Hypertension   . Diabetes mellitus     Type II-diet  . Vitamin D deficiency   . Peripheral neuropathy   . Disc disease, degenerative, cervical 06/07/2011  . Chronic neck pain 06/07/2011  . B12 deficiency 09/27/2011   Past Surgical History  Procedure Laterality Date  . Knee surgery    . Abdominal hysterectomy      reports that she has never smoked. She has never used smokeless tobacco. She reports that she does not drink alcohol or use illicit drugs. family history includes Arthritis in her other; Diabetes in her mother and other; and Hyperlipidemia in her other. Allergies  Allergen Reactions  . Lactose Intolerance (Gi)   . Naproxen   / Current Outpatient Prescriptions on File Prior to Visit  Medication Sig Dispense Refill  . aspirin 81 MG EC tablet Take 81 mg by mouth daily.        Marland Kitchen atenolol (TENORMIN) 50 MG tablet Take 1 tablet (50 mg total) by mouth daily.  90 tablet  3  . calcium-vitamin D (OSCAL WITH D) 500-200 MG-UNIT per tablet Take 1 tablet by mouth 2 (two) times daily.       . cetirizine (ZYRTEC) 10 MG tablet Take 10 mg by mouth daily.        Marland Kitchen Cod Liver Oil CAPS Take 1 capsule by mouth daily.        Marland Kitchen  cyanocobalamin (,VITAMIN B-12,) 1000 MCG/ML injection Inject 1,000 mcg into the muscle every 30 (thirty) days.        . ferrous sulfate 325 (65 FE) MG tablet Take 325 mg by mouth daily.        Marland Kitchen HYDROcodone-acetaminophen (NORCO/VICODIN) 5-325 MG per tablet Take 1 tablet by mouth every 8 (eight) hours as needed for pain.  90 tablet  2  . levocetirizine (XYZAL) 5 MG tablet Take 1 tablet (5 mg total) by mouth every evening.  90 tablet  3  . nystatin cream (MYCOSTATIN) Apply topically 2 (two) times daily.  30 g  1  . albuterol (PROVENTIL HFA;VENTOLIN HFA) 108 (90 BASE) MCG/ACT inhaler Inhale 2 puffs into the lungs every 6 (six) hours as needed for wheezing.  1 Inhaler  11  . sulfamethoxazole-trimethoprim (SEPTRA DS) 800-160 MG per tablet Take 1 tablet by mouth 2 (two) times daily.   28 tablet  0   No current facility-administered medications on file prior to visit.   Review of Systems  Constitutional: Negative for diaphoresis, activity change, appetite change or unexpected weight change.  HENT: Negative for hearing loss, ear pain, facial swelling, mouth sores and neck stiffness.   Eyes: Negative for pain, redness and visual disturbance.  Respiratory: Negative for shortness of breath and wheezing.   Cardiovascular: Negative for chest pain and palpitations.  Gastrointestinal: Negative for diarrhea, blood in stool, abdominal distention or other pain Genitourinary: Negative for hematuria, flank pain or change in urine volume.  Musculoskeletal: Negative for myalgias and joint swelling.  Skin: Negative for color change and wound.  Neurological: Negative for syncope and numbness. other than noted Hematological: Negative for adenopathy.  Psychiatric/Behavioral: Negative for hallucinations, self-injury, decreased concentration and agitation.      Objective:   Physical Exam BP 120/70  Pulse 55  Ht 5\' 4"  (1.626 m)  Wt 233 lb (105.688 kg)  BMI 39.97 kg/m2  SpO2 97% VS noted,  Constitutional: Pt is oriented to person, place, and time. Appears well-developed and well-nourished.  Head: Normocephalic and atraumatic.  Right Ear: External ear normal.  Left Ear: External ear normal.  Nose: Nose normal.  Mouth/Throat: Oropharynx is clear and moist.  Eyes: Conjunctivae and EOM are normal. Pupils are equal, round, and reactive to light.  Neck: Normal range of motion. Neck supple. No JVD present. No tracheal deviation present.  Cardiovascular: Normal rate, regular rhythm, normal heart sounds and intact distal pulses.   Pulmonary/Chest: Effort normal and breath sounds normal.  Abdominal: Soft. Bowel sounds are normal. There is no tenderness. No HSM  Musculoskeletal: Normal range of motion. Exhibits no edema.  Lymphadenopathy:  Has no cervical adenopathy.  Neurological: Pt is  alert and oriented to person, place, and time. Pt has normal reflexes. No cranial nerve deficit.  Skin: Skin is warm and dry. No rash noted. Has 3 < 1/2 cm areas red/tender/swelling  Psychiatric:  Has  normal mood and affect. Behavior is normal.     Assessment & Plan:

## 2013-05-20 NOTE — Assessment & Plan Note (Signed)
For limited prn vicodin use only,  to f/u any worsening symptoms or concerns

## 2013-05-20 NOTE — Assessment & Plan Note (Signed)
stable overall by history and exam, recent data reviewed with pt, and pt to continue medical treatment as before,  to f/u any worsening symptoms or concerns Lab Results  Component Value Date   HGBA1C 5.7 05/15/2012

## 2013-05-20 NOTE — Patient Instructions (Addendum)
Please take all new medication as prescribed - the [facility] ([facility]) Please continue all other medications as before, and refills have been done if requested, including the pain medication Please continue your efforts at being more active, low cholesterol diet, and weight control. You will be contacted regarding the referral for: colonoscopy Please call if you want to be referred again to orthopedic You are otherwise up to date with prevention measures today. Please go to the LAB in the Basement (turn left off the elevator) for the tests to be done today You will be contacted by phone if any changes need to be made immediately.  Otherwise, you will receive a letter about your results with an explanation, but please check with MyChart first.  Please remember to sign up for My Chart if you have not done so, as this will be important to you in the future with finding out test results, communicating by private email, and scheduling acute appointments online when needed.  Please return in 6 months, or sooner if needed, with Lab testing done 3-5 days before

## 2013-05-20 NOTE — Addendum Note (Signed)
Addended by: Corwin Levins on: 05/20/2013 09:03 AM   Modules accepted: Orders

## 2013-05-20 NOTE — Assessment & Plan Note (Signed)

## 2013-05-20 NOTE — Assessment & Plan Note (Signed)
Recurrent mild left only this time, seems ot occur 1-2 times per yr, ok for repat doxycycline,  to f/u any worsening symptoms or concerns, to avoid shaving close and antipersp/deoderants

## 2013-05-21 ENCOUNTER — Encounter: Payer: Self-pay | Admitting: Internal Medicine

## 2013-05-21 ENCOUNTER — Telehealth: Payer: Self-pay

## 2013-05-21 NOTE — Telephone Encounter (Signed)
The patient would like to know if she should start back on vitamin B12 injections??

## 2013-05-22 MED ORDER — DOXYCYCLINE HYCLATE 100 MG PO TABS: 100.0000 mg | ORAL_TABLET | Freq: Two times a day (BID) | ORAL | Status: DC

## 2013-05-22 MED ORDER — ALENDRONATE SODIUM 70 MG PO TABS: 70.0000 mg | ORAL_TABLET | ORAL | Status: DC

## 2013-05-22 MED ORDER — ATENOLOL 50 MG PO TABS: 50.0000 mg | ORAL_TABLET | Freq: Every day | ORAL | Status: DC

## 2013-05-22 NOTE — Telephone Encounter (Signed)
Same answer as previuous

## 2013-05-22 NOTE — Telephone Encounter (Signed)
Patient informed.  Resent prescriptions to [facility] on [address] [place] as [name] is too expensive.

## 2013-05-27 ENCOUNTER — Encounter: Payer: Self-pay | Admitting: Internal Medicine

## 2013-07-23 ENCOUNTER — Telehealth: Payer: Self-pay | Admitting: Internal Medicine

## 2013-07-23 DIAGNOSIS — J45901 Unspecified asthma with (acute) exacerbation: Secondary | ICD-10-CM

## 2013-07-23 MED ORDER — HYDROCODONE-ACETAMINOPHEN 5-325 MG PO TABS: 1.0000 | ORAL_TABLET | Freq: Three times a day (TID) | ORAL | Status: DC | PRN

## 2013-07-23 MED ORDER — ALBUTEROL SULFATE HFA 108 (90 BASE) MCG/ACT IN AERS: 2.0000 | INHALATION_SPRAY | Freq: Four times a day (QID) | RESPIRATORY_TRACT | Status: DC | PRN

## 2013-07-23 NOTE — Telephone Encounter (Signed)
Called the patient informed of MD instructions and faxed hardcopy to [facility]

## 2013-07-23 NOTE — Telephone Encounter (Signed)
Ok for inhaler - sent erx, but needs OV if not easily improved or gets worse cough, Sob, or wheezing, or fever or pain  Pain med - Done hardcopy to robin

## 2013-07-23 NOTE — Telephone Encounter (Signed)
Requesting a refill on her inhaler and hydrocodone.  She uses [facility] [address].  Her chest is tight from the asthma for 3-4 days.  It is getting worse.  She hasn't used the inhaler for 2 years.

## 2013-08-30 ENCOUNTER — Ambulatory Visit (INDEPENDENT_AMBULATORY_CARE_PROVIDER_SITE_OTHER): Payer: Medicare Other | Admitting: Internal Medicine

## 2013-08-30 ENCOUNTER — Encounter: Payer: Self-pay | Admitting: *Deleted

## 2013-08-30 ENCOUNTER — Encounter: Payer: Self-pay | Admitting: Internal Medicine

## 2013-08-30 VITALS — BP 132/70 | HR 56 | Temp 97.9°F

## 2013-08-30 DIAGNOSIS — Z23 Encounter for immunization: Secondary | ICD-10-CM

## 2013-08-30 DIAGNOSIS — J441 Chronic obstructive pulmonary disease with (acute) exacerbation: Secondary | ICD-10-CM

## 2013-08-30 DIAGNOSIS — J45901 Unspecified asthma with (acute) exacerbation: Secondary | ICD-10-CM

## 2013-08-30 DIAGNOSIS — I10 Essential (primary) hypertension: Secondary | ICD-10-CM

## 2013-08-30 DIAGNOSIS — J309 Allergic rhinitis, unspecified: Secondary | ICD-10-CM

## 2013-08-30 MED ORDER — PREDNISONE (PAK) 10 MG PO TABS: 10.0000 mg | ORAL_TABLET | ORAL | Status: DC

## 2013-08-30 MED ORDER — LEVOFLOXACIN 500 MG PO TABS: 500.0000 mg | ORAL_TABLET | Freq: Every day | ORAL | Status: DC

## 2013-08-30 NOTE — Progress Notes (Signed)
Subjective:    Patient ID: [name], female    DOB: 1941-04-19, 72 y.o.   MRN: [id number]  Cough This is a chronic problem. The current episode started more than 1 month ago. The problem has been waxing and waning. The problem occurs every few minutes. The cough is productive of sputum. Associated symptoms include nasal congestion, postnasal drip, a sore throat, shortness of breath and wheezing. Pertinent negatives include no chest pain, ear congestion, ear pain, fever, headaches, heartburn, hemoptysis, myalgias, rhinorrhea, sweats or weight loss. The symptoms are aggravated by lying down. Risk factors for lung disease include smoking/tobacco exposure. She has tried a beta-agonist inhaler for the symptoms. The treatment provided mild relief. Her past medical history is significant for asthma, bronchitis and environmental allergies. There is no history of bronchiectasis.    Past Medical History  Diagnosis Date  . Hx of colonic polyps   . Chronic headaches     Chronic pain  . Hypothyroidism   . Hyperlipidemia   . Anemia     NOS  . DJD (degenerative joint disease)     Spine, hands  . Asthma   . Hearing loss   . Sickle cell trait   . Migraine   . Peptic ulcer disease   . Allergic rhinitis   . DDD (degenerative disc disease), cervical   . CTS (carpal tunnel syndrome) 8/08    Right, severe, emg/ncs; Left, moderate, emg/ncs  . Stenosing tenosynovitis of thumb     Left  . Hypertension   . Diabetes mellitus     Type II-diet  . Vitamin D deficiency   . Peripheral neuropathy   . Disc disease, degenerative, cervical 06/07/2011  . Chronic neck pain 06/07/2011  . B12 deficiency 09/27/2011    Review of Systems  Constitutional: Negative for fever and weight loss.  HENT: Positive for postnasal drip and sore throat. Negative for ear pain and rhinorrhea.   Respiratory: Positive for cough, shortness of breath and wheezing. Negative for hemoptysis.   Cardiovascular: Negative for chest pain.   Gastrointestinal: Negative for heartburn.  Musculoskeletal: Negative for myalgias.  Allergic/Immunologic: Positive for environmental allergies.  Neurological: Negative for headaches.       Objective:   Physical Exam BP 132/70  Pulse 56  Temp(Src) 97.9 F (36.6 C) (Oral)  SpO2 95% Wt Readings from Last 3 Encounters:  05/20/13 233 lb (105.688 kg)  11/15/12 252 lb 4 oz (114.42 kg)  09/17/12 247 lb 2 oz (112.095 kg)   Constitutional: She is obese, but appears well-developed and well-nourished. Coughing, but no distress.  HENT: Head: Normocephalic and atraumatic. Ears: B TMs ok, no erythema or effusion; Nose: Nose normal. Mouth/Throat: Oropharynx is clear and moist. No oropharyngeal exudate.  Eyes: Conjunctivae and EOM are normal. Pupils are equal, round, and reactive to light. No scleral icterus.  Neck: Normal range of motion. Neck supple. No JVD present. No thyromegaly present.  Cardiovascular: Normal rate, regular rhythm and normal heart sounds.  No murmur heard. No BLE edema. Pulmonary/Chest: Effort normal, but  breath sounds with rhonchi bilaterally, right-sided expiratory wheeze. No respiratory distress.  Skin: Skin is warm and dry. No rash noted. No erythema.  Psychiatric: She has a normal mood and affect. Her behavior is normal. Judgment and thought content normal.   Lab Results  Component Value Date   WBC 4.5 05/20/2013   HGB 11.8* 05/20/2013   HCT 34.9* 05/20/2013   PLT 125.0* 05/20/2013   GLUCOSE 110* 05/20/2013   CHOL 159 05/20/2013  TRIG 109.0 05/20/2013   HDL 36.10* 05/20/2013   LDLCALC 101* 05/20/2013   ALT 13 05/20/2013   AST 17 05/20/2013   NA 140 05/20/2013   K 3.9 05/20/2013   CL 107 05/20/2013   CREATININE 1.3* 05/20/2013   BUN 11 05/20/2013   CO2 30 05/20/2013   TSH 4.46 05/20/2013   INR 1.02 09/26/2011   HGBA1C 6.1 05/20/2013   MICROALBUR 0.8 05/20/2013       Assessment & Plan:   Acute asthmatic bronchitis  Prednisone taper and Levaquin daily for 7 days Continue albuterol as  ongoing

## 2013-08-30 NOTE — Progress Notes (Signed)
Pre-visit discussion using our clinic review tool. No additional management support is needed unless otherwise documented below in the visit note.  

## 2013-08-30 NOTE — Assessment & Plan Note (Signed)
Encourage compliance with antihistamine as previously prescribed Reviewed relationship between allergies and asthma flare

## 2013-08-30 NOTE — Patient Instructions (Signed)
It was good to see you today.  Your annual flu shot was given and/or updated today.  We have reviewed your prior records including labs and tests today  Medications reviewed and updated  Levaquin antibiotics once daily for one week and prednisone taper over the next 6 days -take both prescriptions as directed  Your prescription(s) have been submitted to your pharmacy. Please take as directed and contact our office if you believe you are having problem(s) with the medication(s).  Continue other medications as ongoing  Followup in 3 months with Dr. Jonny Ruiz as planned, call sooner if problems -or unimproved in next 7-10 days

## 2013-08-30 NOTE — Assessment & Plan Note (Signed)
BP Readings from Last 3 Encounters:  08/30/13 132/70  05/20/13 120/70  11/15/12 122/62   The current medical regimen is effective;  continue present plan and medications.

## 2013-10-31 ENCOUNTER — Telehealth: Payer: Self-pay | Admitting: Internal Medicine

## 2013-10-31 MED ORDER — HYDROCODONE-ACETAMINOPHEN 5-325 MG PO TABS: 1.0000 | ORAL_TABLET | Freq: Three times a day (TID) | ORAL | Status: DC | PRN

## 2013-10-31 NOTE — Telephone Encounter (Signed)
Done hardcopy to robin, but also to let pt know  You are given the letter today explaining the transitional pain medication refill policy due to recent change in [place] Law, and [place] Med Board Regulations  Please be aware that I will no longer be able to offer monthly refills of any Schedule II or higher medication starting Dec 15, 2013

## 2013-10-31 NOTE — Telephone Encounter (Signed)
Requesting a refill on hydrocodone.  She has an appt on Tues.

## 2013-11-01 NOTE — Telephone Encounter (Signed)
Called the patient informed hardcopy's are ready for pickup at the front desk.  Also explained enclosed letter regarding change in pain medication refill.

## 2013-11-02 ENCOUNTER — Ambulatory Visit (HOSPITAL_COMMUNITY)
Admission: RE | Admit: 2013-11-02 | Discharge: 2013-11-02 | Disposition: A | Discharge status: Home / Self Care | Payer: Medicare Other | Source: Ambulatory Visit | Attending: Internal Medicine | Admitting: Internal Medicine

## 2013-11-02 ENCOUNTER — Encounter: Payer: Self-pay | Admitting: Internal Medicine

## 2013-11-02 ENCOUNTER — Ambulatory Visit (INDEPENDENT_AMBULATORY_CARE_PROVIDER_SITE_OTHER): Payer: Medicare Other | Admitting: Internal Medicine

## 2013-11-02 VITALS — BP 120/74 | HR 56 | Temp 98.6°F

## 2013-11-02 DIAGNOSIS — R05 Cough: Secondary | ICD-10-CM | POA: Insufficient documentation

## 2013-11-02 DIAGNOSIS — R091 Pleurisy: Secondary | ICD-10-CM

## 2013-11-02 DIAGNOSIS — R059 Cough, unspecified: Secondary | ICD-10-CM

## 2013-11-02 DIAGNOSIS — J45901 Unspecified asthma with (acute) exacerbation: Secondary | ICD-10-CM

## 2013-11-02 DIAGNOSIS — J45909 Unspecified asthma, uncomplicated: Secondary | ICD-10-CM

## 2013-11-02 DIAGNOSIS — R0989 Other specified symptoms and signs involving the circulatory and respiratory systems: Secondary | ICD-10-CM | POA: Insufficient documentation

## 2013-11-02 MED ORDER — METHYLPREDNISOLONE ACETATE 80 MG/ML IJ SUSP
80.0000 mg | Freq: Once | INTRAMUSCULAR | Status: AC
  Administered 2013-11-02: 80 mg via INTRAMUSCULAR

## 2013-11-02 MED ORDER — MOMETASONE FURO-FORMOTEROL FUM 100-5 MCG/ACT IN AERO: 2.0000 | INHALATION_SPRAY | Freq: Two times a day (BID) | RESPIRATORY_TRACT | Status: DC

## 2013-11-02 MED ORDER — PREDNISONE (PAK) 10 MG PO TABS: ORAL_TABLET | ORAL | Status: DC

## 2013-11-02 MED ORDER — LEVOFLOXACIN 500 MG PO TABS: 500.0000 mg | ORAL_TABLET | Freq: Every day | ORAL | Status: DC

## 2013-11-02 NOTE — Progress Notes (Signed)
Subjective:    Patient ID: [name], female    DOB: [birth year], 72 y.o.   MRN: [id number]  HPI  Complains of cough Onset 2 months ago, briefly improved with prednisone and antibiotics but recurrent and worsening past 2 weeks Denies use albuterol, but notes wheeze, especially at night Associated with right-sided pleurisy and chest pain, increased with cough Also associated with yellow green sputum and sore throat Sick contacts at home caring for grandbabies  Past Medical History  Diagnosis Date  . Hx of colonic polyps   . Chronic headaches     Chronic pain  . Hypothyroidism   . Hyperlipidemia   . Anemia     NOS  . DJD (degenerative joint disease)     Spine, hands  . Asthma   . Hearing loss   . Sickle cell trait   . Migraine   . Peptic ulcer disease   . Allergic rhinitis   . DDD (degenerative disc disease), cervical   . CTS (carpal tunnel syndrome) 8/08    Right, severe, emg/ncs; Left, moderate, emg/ncs  . Stenosing tenosynovitis of thumb     Left  . Hypertension   . Diabetes mellitus     Type II-diet  . Vitamin D deficiency   . Peripheral neuropathy   . Disc disease, degenerative, cervical 06/07/2011  . Chronic neck pain 06/07/2011  . B12 deficiency 09/27/2011    Review of Systems  Constitutional: Positive for fatigue. Negative for fever and unexpected weight change.  HENT: Positive for postnasal drip and sinus pressure. Negative for rhinorrhea and sneezing.   Respiratory: Positive for cough, chest tightness and wheezing.   Cardiovascular: Positive for chest pain (on R with cough). Negative for palpitations and leg swelling.       Objective:   Physical Exam BP 120/74  Pulse 56  Temp(Src) 98.6 F (37 C) (Oral)  SpO2 97% Wt Readings from Last 3 Encounters:  05/20/13 233 lb (105.688 kg)  11/15/12 252 lb 4 oz (114.42 kg)  09/17/12 247 lb 2 oz (112.095 kg)   Constitutional: She is obese but appears well-developed and well-nourished. Hoarse, but no  distress.  HENT: Head: Normocephalic and atraumatic. Ears: B hearing aides; Nose: Nose normal. Mouth/Throat: Oropharynx is clear and moist. No oropharyngeal exudate, but mod erythema.  Eyes: Conjunctivae and EOM are normal. Pupils are equal, round, and reactive to light. No scleral icterus.  Neck: Normal range of motion. Neck supple. No LAD or JVD present. No thyromegaly present.  Cardiovascular: Normal rate, regular rhythm and normal heart sounds.  No murmur heard. No BLE edema. Pulmonary/Chest: Effort normal, tight wheeze breath sounds RUL. No respiratory distress. She has no crackle.  Psychiatric: She has a normal mood and affect. Her behavior is normal. Judgment and thought content normal.   Lab Results  Component Value Date   WBC 4.5 05/20/2013   HGB 11.8* 05/20/2013   HCT 34.9* 05/20/2013   PLT 125.0* 05/20/2013   GLUCOSE 110* 05/20/2013   CHOL 159 05/20/2013   TRIG 109.0 05/20/2013   HDL 36.10* 05/20/2013   LDLCALC 101* 05/20/2013   ALT 13 05/20/2013   AST 17 05/20/2013   NA 140 05/20/2013   K 3.9 05/20/2013   CL 107 05/20/2013   CREATININE 1.3* 05/20/2013   BUN 11 05/20/2013   CO2 30 05/20/2013   TSH 4.46 05/20/2013   INR 1.02 09/26/2011   HGBA1C 6.1 05/20/2013   MICROALBUR 0.8 05/20/2013       Assessment & Plan:  Acute asthma exacerbation with cough and wheeze, localized on right side  Recurrent symptoms in past 2 month  Check chest x-ray ( scheduled at home office on Monday 12/22 -patient will return for same) IM Medrol 80 mg plus prednisone taper Begin Dulera and encouraged albuterol as needed for rescue symptoms Empiric Levaquin 500 mg daily x7 days pending chest x-ray results

## 2013-11-02 NOTE — Patient Instructions (Addendum)
It was good to see you today.  We have reviewed your prior records including labs and tests today  Steroid shot given to you in the office today  Medications reviewed and updated  Levaquin antibiotics once daily for one week and prednisone taper over the next 6 days -take both prescriptions as directed  Also start Dulera inhaler 2x/day - sample given and new prescription sent  Your prescription(s) have been submitted to your pharmacy. Please take as directed and contact our office if you believe you are having problem(s) with the medication(s).  Continue other medications as ongoing  Return on Monday 11/04/13 for chest x-ray. We will call you with these results after review  Followup in 3 months with Dr. Jonny Ruiz as planned, call sooner if problems -or unimproved in next 7-10 days    Asthma Attack Prevention Although there is no way to prevent asthma from starting, you can take steps to control the disease and reduce its symptoms. Learn about your asthma and how to control it. Take an active role to control your asthma by working with your health care provider to create and follow an asthma action plan. An asthma action plan guides you in:  Taking your medicines properly.  Avoiding things that set off your asthma or make your asthma worse (asthma triggers).  Tracking your level of asthma control.  Responding to worsening asthma.  Seeking emergency care when needed. To track your asthma, keep records of your symptoms, check your peak flow number using a handheld device that shows how well air moves out of your lungs (peak flow meter), and get regular asthma checkups.  WHAT ARE SOME WAYS TO PREVENT AN ASTHMA ATTACK?  Take medicines as directed by your health care provider.  Keep track of your asthma symptoms and level of control.  With your health care provider, write a detailed plan for taking medicines and managing an asthma attack. Then be sure to follow your action plan. Asthma  is an ongoing condition that needs regular monitoring and treatment.  Identify and avoid asthma triggers. Many outdoor allergens and irritants (such as pollen, mold, cold air, and air pollution) can trigger asthma attacks. Find out what your asthma triggers are and take steps to avoid them.  Monitor your breathing. Learn to recognize warning signs of an attack, such as coughing, wheezing, or shortness of breath. Your lung function may decrease before you notice any signs or symptoms, so regularly measure and record your peak airflow with a home peak flow meter.  Identify and treat attacks early. If you act quickly, you are less likely to have a severe attack. You will also need less medicine to control your symptoms. When your peak flow measurements decrease and alert you to an upcoming attack, take your medicine as instructed and immediately stop any activity that may have triggered the attack. If your symptoms do not improve, get medical help.  Pay attention to increasing quick-relief inhaler use. If you find yourself relying on your quick-relief inhaler, your asthma is not under control. See your health care provider about adjusting your treatment. WHAT CAN MAKE MY SYMPTOMS WORSE? A number of common things can set off or make your asthma symptoms worse and cause temporary increased inflammation of your airways. Keep track of your asthma symptoms for several weeks, detailing all the environmental and emotional factors that are linked with your asthma. When you have an asthma attack, go back to your asthma diary to see which factor, or combination of factors,  might have contributed to it. Once you know what these factors are, you can take steps to control many of them. If you have allergies and asthma, it is important to take asthma prevention steps at home. Minimizing contact with the substance to which you are allergic will help prevent an asthma attack. Some triggers and ways to avoid these triggers  are: Animal Dander:  Some people are allergic to the flakes of skin or dried saliva from animals with fur or feathers.   There is no such thing as a hypoallergenic dog or cat breed. All dogs or cats can cause allergies, even if they don't shed.  Keep these pets out of your home.  If you are not able to keep a pet outdoors, keep the pet out of your bedroom and other sleeping areas at all times, and keep the door closed.  Remove carpets and furniture covered with cloth from your home. If that is not possible, keep the pet away from fabric-covered furniture and carpets. Dust Mites: Many people with asthma are allergic to dust mites. Dust mites are tiny bugs that are found in every home in mattresses, pillows, carpets, fabric-covered furniture, bedcovers, clothes, stuffed toys, and other fabric-covered items.   Cover your mattress in a special dust-proof cover.  Cover your pillow in a special dust-proof cover, or wash the pillow each week in hot water. Water must be hotter than 130 F (54.4 C) to kill dust mites. Cold or warm water used with detergent and bleach can also be effective.  Wash the sheets and blankets on your bed each week in hot water.  Try not to sleep or lie on cloth-covered cushions.  Call ahead when traveling and ask for a smoke-free hotel room. Bring your own bedding and pillows in case the hotel only supplies feather pillows and down comforters, which may contain dust mites and cause asthma symptoms.  Remove carpets from your bedroom and those laid on concrete, if you can.  Keep stuffed toys out of the bed, or wash the toys weekly in hot water or cooler water with detergent and bleach. Cockroaches: Many people with asthma are allergic to the droppings and remains of cockroaches.   Keep food and garbage in closed containers. Never leave food out.  Use poison baits, traps, powders, gels, or paste (for example, boric acid).  If a spray is used to kill cockroaches, stay  out of the room until the odor goes away. Indoor Mold:  Fix leaky faucets, pipes, or other sources of water that have mold around them.  Clean floors and moldy surfaces with a fungicide or diluted bleach.  Avoid using humidifiers, vaporizers, or swamp coolers. These can spread molds through the air. Pollen and Outdoor Mold:  When pollen or mold spore counts are high, try to keep your windows closed.  Stay indoors with windows closed from late morning to afternoon. Pollen and some mold spore counts are highest at that time.  Ask your health care provider whether you need to take anti-inflammatory medicine or increase your dose of the medicine before your allergy season starts. Other Irritants to Avoid:  Tobacco smoke is an irritant. If you smoke, ask your health care provider how you can quit. Ask family members to quit smoking too. Do not allow smoking in your home or car.  If possible, do not use a wood-burning stove, kerosene heater, or fireplace. Minimize exposure to all sources of smoke, including to incense, candles, fires, and fireworks.  Try to  stay away from strong odors and sprays, such as perfume, talcum powder, hair spray, and paints.  Decrease humidity in your home and use an indoor air cleaning device. Reduce indoor humidity to below 60%. Dehumidifiers or central air conditioners can do this.  Decrease house dust exposure by changing furnace and air cooler filters frequently.  Try to have someone else vacuum for you once or twice a week. Stay out of rooms while they are being vacuumed and for a short while afterward.  If you vacuum, use a dust mask from a hardware store, a double-layered or microfilter vacuum cleaner bag, or a vacuum cleaner with a HEPA filter.  Sulfites in foods and beverages can be irritants. Do not drink beer or wine or eat dried fruit, processed potatoes, or shrimp if they cause asthma symptoms.  Cold air can trigger an asthma attack. Cover your nose  and mouth with a scarf on cold or windy days.  Several health conditions can make asthma more difficult to manage, including a runny nose, sinus infections, reflux disease, psychological stress, and sleep apnea. Work with your health care provider to manage these conditions.  Avoid close contact with people who have a respiratory infection such as a cold or the flu, since your asthma symptoms may get worse if you catch the infection. Wash your hands thoroughly after touching items that may have been handled by people with a respiratory infection.  Get a flu shot every year to protect against the flu virus, which often makes asthma worse for days or weeks. Also get a pneumonia shot if you have not previously had one. Unlike the flu shot, the pneumonia shot does not need to be given yearly. Medicines:  Talk to your health care provider about whether it is safe for you to take aspirin or non-steroidal anti-inflammatory medicines (NSAIDs). In a small number of people with asthma, aspirin and NSAIDs can cause asthma attacks. These medicines must be avoided by people who have known aspirin-sensitive asthma. It is important that people with aspirin-sensitive asthma read labels of all over-the-counter medicines used to treat pain, colds, coughs, and fever.  Beta blockers and ACE inhibitors are other medicines you should discuss with your health care provider. HOW CAN I FIND OUT WHAT I AM ALLERGIC TO? Ask your asthma health care provider about allergy skin testing or blood testing (the RAST test) to identify the allergens to which you are sensitive. If you are found to have allergies, the most important thing to do is to try to avoid exposure to any allergens that you are sensitive to as much as possible. Other treatments for allergies, such as medicines and allergy shots (immunotherapy) are available.  CAN I EXERCISE? Follow your health care provider's advice regarding asthma treatment before exercising. It  is important to maintain a regular exercise program, but vigorous exercise, or exercise in cold, humid, or dry environments can cause asthma attacks, especially for those people who have exercise-induced asthma. Document Released: 10/19/2009 Document Revised: 07/03/2013 Document Reviewed: 05/08/2013 [facility] Patient Information 2014 [place], [place].

## 2013-11-02 NOTE — Progress Notes (Signed)
Pre-visit discussion using our clinic review tool. No additional management support is needed unless otherwise documented below in the visit note.  

## 2013-11-05 ENCOUNTER — Ambulatory Visit: Payer: Medicare Other | Admitting: Internal Medicine

## 2013-11-20 ENCOUNTER — Encounter: Payer: Self-pay | Admitting: Internal Medicine

## 2013-11-20 ENCOUNTER — Ambulatory Visit (INDEPENDENT_AMBULATORY_CARE_PROVIDER_SITE_OTHER): Payer: Medicare Other | Admitting: Internal Medicine

## 2013-11-20 ENCOUNTER — Other Ambulatory Visit (INDEPENDENT_AMBULATORY_CARE_PROVIDER_SITE_OTHER): Payer: Medicare Other

## 2013-11-20 VITALS — BP 142/74 | HR 62 | Temp 98.2°F | Resp 16 | Wt 227.1 lb

## 2013-11-20 DIAGNOSIS — G5603 Carpal tunnel syndrome, bilateral upper limbs: Secondary | ICD-10-CM | POA: Insufficient documentation

## 2013-11-20 DIAGNOSIS — E538 Deficiency of other specified B group vitamins: Secondary | ICD-10-CM

## 2013-11-20 DIAGNOSIS — R7302 Impaired glucose tolerance (oral): Secondary | ICD-10-CM

## 2013-11-20 DIAGNOSIS — D509 Iron deficiency anemia, unspecified: Secondary | ICD-10-CM

## 2013-11-20 DIAGNOSIS — M25569 Pain in unspecified knee: Secondary | ICD-10-CM | POA: Insufficient documentation

## 2013-11-20 DIAGNOSIS — G5600 Carpal tunnel syndrome, unspecified upper limb: Secondary | ICD-10-CM

## 2013-11-20 DIAGNOSIS — R7309 Other abnormal glucose: Secondary | ICD-10-CM

## 2013-11-20 DIAGNOSIS — J309 Allergic rhinitis, unspecified: Secondary | ICD-10-CM

## 2013-11-20 DIAGNOSIS — M25562 Pain in left knee: Secondary | ICD-10-CM

## 2013-11-20 DIAGNOSIS — G5601 Carpal tunnel syndrome, right upper limb: Secondary | ICD-10-CM

## 2013-11-20 DIAGNOSIS — M6530 Trigger finger, unspecified finger: Secondary | ICD-10-CM

## 2013-11-20 DIAGNOSIS — I10 Essential (primary) hypertension: Secondary | ICD-10-CM

## 2013-11-20 DIAGNOSIS — Z0000 Encounter for general adult medical examination without abnormal findings: Secondary | ICD-10-CM

## 2013-11-20 LAB — CBC WITH DIFFERENTIAL/PLATELET
BASOS ABS: 0 10*3/uL (ref 0.0–0.1)
Basophils Relative: 0 % (ref 0.0–3.0)
Eosinophils Absolute: 0 10*3/uL (ref 0.0–0.7)
Eosinophils Relative: 0.2 % (ref 0.0–5.0)
HEMATOCRIT: 36.1 % (ref 36.0–46.0)
Hemoglobin: 12.2 g/dL (ref 12.0–15.0)
LYMPHS ABS: 1.2 10*3/uL (ref 0.7–4.0)
Lymphocytes Relative: 16 % (ref 12.0–46.0)
MCHC: 33.8 g/dL (ref 30.0–36.0)
MCV: 88.4 fl (ref 78.0–100.0)
MONO ABS: 0.3 10*3/uL (ref 0.1–1.0)
MONOS PCT: 4 % (ref 3.0–12.0)
Neutro Abs: 6.1 10*3/uL (ref 1.4–7.7)
Neutrophils Relative %: 79.8 % — ABNORMAL HIGH (ref 43.0–77.0)
PLATELETS: 134 10*3/uL — AB (ref 150.0–400.0)
RBC: 4.08 Mil/uL (ref 3.87–5.11)
RDW: 13.6 % (ref 11.5–14.6)
WBC: 7.7 10*3/uL (ref 4.5–10.5)

## 2013-11-20 LAB — BASIC METABOLIC PANEL
BUN: 19 mg/dL (ref 6–23)
CO2: 30 meq/L (ref 19–32)
Calcium: 9.6 mg/dL (ref 8.4–10.5)
Chloride: 105 mEq/L (ref 96–112)
Creatinine, Ser: 1.4 mg/dL — ABNORMAL HIGH (ref 0.4–1.2)
GFR: 49.13 mL/min — AB (ref >60.00)
Glucose, Bld: 91 mg/dL (ref 70–99)
POTASSIUM: 4.8 meq/L (ref 3.5–5.1)
Sodium: 142 mEq/L (ref 135–145)

## 2013-11-20 LAB — HEMOGLOBIN A1C: Hgb A1c MFr Bld: 5.8 % (ref 4.6–6.5)

## 2013-11-20 LAB — VITAMIN B12: Vitamin B-12: 193 pg/mL — ABNORMAL LOW (ref 211–911)

## 2013-11-20 NOTE — Assessment & Plan Note (Signed)
For f/u lab, has been taking oral x 6 mo

## 2013-11-20 NOTE — Assessment & Plan Note (Signed)
Also to be addressed per hand surgury, to be referred

## 2013-11-20 NOTE — Assessment & Plan Note (Signed)
Asympt, stable overall by history and exam, recent data reviewed with pt, and pt to continue medical treatment as before,  to f/u any worsening symptoms or concerns Lab Results  Component Value Date   HGBA1C 6.1 05/20/2013

## 2013-11-20 NOTE — Progress Notes (Signed)
Subjective:    Patient ID: [name], female    DOB: 10/05/1941, 73 y.o.   MRN: [id number]  HPI  Here to f/u; overall doing ok,  Pt denies chest pain, increased sob or doe, wheezing, orthopnea, PND, increased LE swelling, palpitations, dizziness or syncope.  Pt denies polydipsia, polyuria, or low sugar symptoms such as weakness or confusion improved with po intake.  Pt denies new neurological symptoms such as new headache, or facial or extremity weakness or numbness.   Pt states overall good compliance with meds, has been trying to follow lower cholesterol lower carb diet, with wt overall stable,  but little exercise however.  Recent asthma exac resolved, cough and pain resolved, cxr neg. Could not afford the dulera, not sure if similar med would be covered, wants to cont albut prn only - declines other med for now. BP elev mild today but has been OK at home and recent visit.  Does have signficant left knee dialy, as well as several other lesser joint pains.  Has occas tingling in toes at night, and also with intemittent right hand tingling and grip loss, also with right 4th finger trigger finger, has known right CTS per hand surgury about 5 yrs ago, Dr Merlyn LotKuzma.  Past Medical History  Diagnosis Date  . Hx of colonic polyps   . Chronic headaches     Chronic pain  . Hypothyroidism   . Hyperlipidemia   . Anemia     NOS  . DJD (degenerative joint disease)     Spine, hands  . Asthma   . Hearing loss   . Sickle cell trait   . Migraine   . Peptic ulcer disease   . Allergic rhinitis   . DDD (degenerative disc disease), cervical   . CTS (carpal tunnel syndrome) 8/08    Right, severe, emg/ncs; Left, moderate, emg/ncs  . Stenosing tenosynovitis of thumb     Left  . Hypertension   . Diabetes mellitus     Type II-diet  . Vitamin D deficiency   . Peripheral neuropathy   . Disc disease, degenerative, cervical 06/07/2011  . Chronic neck pain 06/07/2011  . B12 deficiency 09/27/2011   Past  Surgical History  Procedure Laterality Date  . Knee surgery    . Abdominal hysterectomy      reports that she has never smoked. She has never used smokeless tobacco. She reports that she does not drink alcohol or use illicit drugs. family history includes Arthritis in her other; Diabetes in her mother and other; Hyperlipidemia in her other. Allergies  Allergen Reactions  . Lactose Intolerance (Gi)   . Naproxen    Review of Systems  Constitutional: Negative for unexpected weight change, or unusual diaphoresis  HENT: Negative for tinnitus.   Eyes: Negative for photophobia and visual disturbance.  Respiratory: Negative for choking and stridor.   Gastrointestinal: Negative for vomiting and blood in stool.  Genitourinary: Negative for hematuria and decreased urine volume.  Musculoskeletal: Negative for acute joint swelling Skin: Negative for color change and wound.  Neurological: Negative for tremors and numbness other than noted  Psychiatric/Behavioral: Negative for decreased concentration or  hyperactivity.       Objective:   Physical Exam BP 142/74  Pulse 62  Temp(Src) 98.2 F (36.8 C) (Oral)  Resp 16  Wt 227 lb 1.9 oz (103.021 kg)  SpO2 97% VS noted,  Constitutional: Pt appears well-developed and well-nourished.  HENT: Head: NCAT.  Right Ear: External ear normal.  Left Ear: External ear normal.  Eyes: Conjunctivae and EOM are normal. Pupils are equal, round, and reactive to light.  Neck: Normal range of motion. Neck supple.  Cardiovascular: Normal rate and regular rhythm.   Pulmonary/Chest: Effort normal and breath sounds without rales or wheezing  Abd:  Soft, NT, non-distended, + BS Neurological: Pt is alert. Not confused  Skin: Skin is warm. No erythema.  Psychiatric: Pt behavior is normal. Thought content normal.  Left knee with mod deg changes, no effusion Right 4th trigger finger noted    Assessment & Plan:

## 2013-11-20 NOTE — Progress Notes (Signed)
Pre-visit discussion using our clinic review tool. No additional management support is needed unless otherwise documented below in the visit note.  

## 2013-11-20 NOTE — Patient Instructions (Addendum)
You had the new Prevnar pneumonia shot today Please conisder calling [employer] ins to see if they cover the shingles shot; if so, make nurse visit appt to have this done You will be contacted regarding the referral for: mammogram You will be contacted regarding the referral for: hand surgury for the right hand, as well as Dr Katrinka BlazingSmith in our office for the left knee Please go to the LAB in the Basement (turn left off the elevator) for the tests to be done today You will be contacted by phone if any changes need to be made immediately.  Otherwise, you will receive a letter about your results with an explanation, but please check with MyChart first.  Please remember to sign up for My Chart if you have not done so, as this will be important to you in the future with finding out test results, communicating by private email, and scheduling acute appointments online when needed.  Please return in 6 months, or sooner if needed

## 2013-11-20 NOTE — Assessment & Plan Note (Signed)
For sport med referral as well, cont pain control

## 2013-11-20 NOTE — Assessment & Plan Note (Addendum)
Worsening symptomatic, has put off surgury rec'd per hand surgury/dr kuzma from about 5 yrs ago, ok for repeat referral  Note:  Total time for pt hx, exam, review of record with pt in the room, determination of diagnoses and plan for further eval and tx is > 40 min, with over 50% spent in coordination and counseling of patient

## 2013-11-20 NOTE — Assessment & Plan Note (Signed)
stable overall by history and exam, recent data reviewed with pt, and pt to continue medical treatment as before,  to f/u any worsening symptoms or concerns BP Readings from Last 3 Encounters:  11/20/13 142/74  11/02/13 120/74  08/30/13 132/70

## 2013-11-20 NOTE — Assessment & Plan Note (Signed)
Mild improving last visit, for f/u labs

## 2013-11-22 ENCOUNTER — Ambulatory Visit: Payer: Medicare Other

## 2013-11-26 ENCOUNTER — Ambulatory Visit: Payer: Medicare Other | Admitting: Family Medicine

## 2013-11-26 ENCOUNTER — Encounter: Payer: Self-pay | Admitting: Family Medicine

## 2013-11-26 ENCOUNTER — Ambulatory Visit (INDEPENDENT_AMBULATORY_CARE_PROVIDER_SITE_OTHER): Payer: Medicare Other | Admitting: Family Medicine

## 2013-11-26 VITALS — BP 122/60 | HR 56 | Temp 97.1°F | Resp 16 | Wt 227.0 lb

## 2013-11-26 DIAGNOSIS — M25562 Pain in left knee: Secondary | ICD-10-CM

## 2013-11-26 DIAGNOSIS — Z23 Encounter for immunization: Secondary | ICD-10-CM

## 2013-11-26 DIAGNOSIS — M25569 Pain in unspecified knee: Secondary | ICD-10-CM

## 2013-11-26 MED ORDER — PNEUMOCOCCAL 13-VAL CONJ VACC IM SUSP
0.5000 mL | Freq: Once | INTRAMUSCULAR | Status: AC
  Administered 2013-11-26: 0.5 mL via INTRAMUSCULAR

## 2013-11-26 NOTE — Assessment & Plan Note (Signed)
Patient left knee pain after having a knee replacement and has a clicking sensation is painful. There is concern for potential loosening. I feel that she should followup with the individual who did the surgery. Patient will return to Dr. Charlann Boxerlin for further evaluation. Patient may need further imaging such as a special a CT scan to see if any movement was occurring with his left knee. Patient can follow up for anything else it necessary.

## 2013-11-26 NOTE — Patient Instructions (Addendum)
Very nice to meet you  Please see Dr. Charlann Boxerlin for your knee. For the concussion melatonin 5mg  nightly. Ice can help after activity for 20 minutes.  Take tylenol 650 mg three times a day is the best evidence based medicine we have for arthritis.  Glucosamine sulfate 750mg  twice a day is a supplement that has been shown to help moderate to severe arthritis. Vitamin D 2000 IU daily Tumeric 500mg  twice daily.  Capsaicin topically up to four times a day may also help with pain. Controlling your weight is important.  Consider physical therapy to strengthen muscles around the joint that hurts to take pressure off of the joint itself. Shoe inserts with good arch support may be helpful.  [employer] [facility] [employer] could help.  Water aerobics and cycling with low resistance are the best two types of exercise for arthritis. Come back and see me if you need me.

## 2013-11-26 NOTE — Progress Notes (Signed)
  CC: Left knee pain  HPI: Patient is a pleasant 73 year old female coming in with a complaint of left knee pain. Patient does have multiple areas of degenerative joint disease and degenerative disc disease of the back previously diagnosed. Patient is also had a left total knee replacement back in 2006 by Dr. Charlann Boxerlin. Patient is also had a right-sided done in 2009. Patient states that her left knee starts to hurt more on the medial and anterior aspect of the knee. Patient states that she has a clicking sensation that causes her to feel very unstable and has significant pain with his left knee. Patient denies any swelling, denies any numbness or any radiation of pain. Patient has become so concern the with walking she is using a cane. Patient has not fallen but is concerned that she may. Patient has tried some over-the-counter ibuprofen without any significant improvement. Patient did see Dr. Farris HasKramer yesterday and they stated that she would need to see individual who did the surgery. Patient does not remember anything else significant from that visit. Patient states that this pain is very severe about 8/10. Patient denies any throbbing pain at night.  Past medical, surgical, family and social history reviewed. Medications reviewed all in the electronic medical record.   Review of Systems: No headache, visual changes, nausea, vomiting, diarrhea, constipation, dizziness, abdominal pain, skin rash, fevers, chills, night sweats, weight loss, swollen lymph nodes, body aches, joint swelling, muscle aches, chest pain, shortness of breath, mood changes.   Objective:    Blood pressure 122/60, pulse 56, temperature 97.1 F (36.2 C), temperature source Oral, resp. rate 16, weight 227 lb 0.6 oz (102.985 kg), SpO2 97.00%. eneral: No apparent distress alert and oriented x3 mood and affect normal, dressed appropriately.  HEENT: Pupils equal, extraocular movements intact Respiratory: Patient's speak in full sentences and  does not appear short of breath Cardiovascular: No lower extremity edema, non tender, no erythema Skin: Warm dry intact with no signs of infection or rash on extremities or on axial skeleton. Abdomen: Soft nontender Neuro: Cranial nerves II through XII are intact, neurovascularly intact in all extremities with 2+ DTRs and 2+ pulses. Lymph: No lymphadenopathy of posterior or anterior cervical chain or axillae bilaterally.  Gait normal with good balance and coordination.  MSK: Non tender with full range of motion and good stability and symmetric strength and tone of shoulders, elbows, wrist, hip,and ankles bilaterally.  Knee: Left knee Inspections shows the patient does have an incision that is well-healed on the left knee no effusion noted. No warmth to touch. Palpation normal with no warmth, joint line tenderness, but mild patellar tenderness but no condyle tenderness. ROM full in flexion and extension and lower leg rotation for her replaced knee. When patient gets near to full extension no patient does have a clicking sensation over the patella that is painful patient states. It appears the patient does have more space seem to the left knee compared to the right knee replacements especially with valgus force. This does cause a popping sensation that is painful to Patellar and quadriceps tendons unremarkable. Hamstring and quadriceps strength is normal.     Impression and Recommendations:     This case required medical decision making of moderate complexity.

## 2013-11-28 LAB — HM MAMMOGRAPHY

## 2013-12-02 LAB — HM DIABETES EYE EXAM

## 2013-12-03 ENCOUNTER — Encounter: Payer: Self-pay | Admitting: Internal Medicine

## 2013-12-13 ENCOUNTER — Encounter: Payer: Self-pay | Admitting: Internal Medicine

## 2014-02-15 ENCOUNTER — Encounter: Payer: Self-pay | Admitting: Family Medicine

## 2014-02-15 ENCOUNTER — Ambulatory Visit (INDEPENDENT_AMBULATORY_CARE_PROVIDER_SITE_OTHER): Payer: Medicare Other | Admitting: Family Medicine

## 2014-02-15 VITALS — BP 150/78 | HR 96 | Temp 98.4°F | Wt 229.0 lb

## 2014-02-15 DIAGNOSIS — J0110 Acute frontal sinusitis, unspecified: Secondary | ICD-10-CM

## 2014-02-15 DIAGNOSIS — J45901 Unspecified asthma with (acute) exacerbation: Secondary | ICD-10-CM

## 2014-02-15 MED ORDER — PROMETHAZINE-DM 6.25-15 MG/5ML PO SYRP: 5.0000 mL | ORAL_SOLUTION | Freq: Four times a day (QID) | ORAL | Status: DC | PRN

## 2014-02-15 MED ORDER — ALBUTEROL SULFATE (2.5 MG/3ML) 0.083% IN NEBU
2.5000 mg | INHALATION_SOLUTION | Freq: Once | RESPIRATORY_TRACT | Status: AC
  Administered 2014-02-15: 2.5 mg via RESPIRATORY_TRACT

## 2014-02-15 MED ORDER — AMOXICILLIN 875 MG PO TABS: 875.0000 mg | ORAL_TABLET | Freq: Two times a day (BID) | ORAL | Status: DC

## 2014-02-15 NOTE — Assessment & Plan Note (Signed)
New.  Pt's sxs and PE consistent w/ infxn.  Start abx.  Reviewed supportive care and red flags that should prompt return.  Pt expressed understanding and is in agreement w/ plan.  

## 2014-02-15 NOTE — Progress Notes (Signed)
Pre visit review using our clinic review tool, if applicable. No additional management support is needed unless otherwise documented below in the visit note. 

## 2014-02-15 NOTE — Progress Notes (Signed)
   Subjective:    Patient ID: [name], female    DOB: 06-09-1941, 73 y.o.   MRN: [id number]  Cough   URI- sxs started Tuesday w/ nasal congestion and cough.  Has hx of asthma.  Cough is productive of yellow sputum.  No fevers.  + sinus pain/pressure.  + sick contacts.  No ear pain/pressure.  Pt has taken son's keflex x2 doses.   Review of Systems  Respiratory: Positive for cough.    For ROS see HPI     Objective:   Physical Exam  Vitals reviewed. Constitutional: She appears well-developed and well-nourished. No distress.  HENT:  Head: Normocephalic and atraumatic.  Right Ear: Tympanic membrane normal.  Left Ear: Tympanic membrane normal.  Nose: Mucosal edema and rhinorrhea present. Right sinus exhibits frontal sinus tenderness. Right sinus exhibits no maxillary sinus tenderness. Left sinus exhibits frontal sinus tenderness. Left sinus exhibits no maxillary sinus tenderness.  Mouth/Throat: Uvula is midline and mucous membranes are normal. Posterior oropharyngeal erythema present. No oropharyngeal exudate.  Eyes: Conjunctivae and EOM are normal. Pupils are equal, round, and reactive to light.  Neck: Normal range of motion. Neck supple.  Cardiovascular: Normal rate, regular rhythm and normal heart sounds.   Pulmonary/Chest: Effort normal and breath sounds normal. No respiratory distress. She has no wheezes.  Decreased air movement diffusely- much improved s/p neb tx  Lymphadenopathy:    She has no cervical adenopathy.          Assessment & Plan:

## 2014-02-15 NOTE — Assessment & Plan Note (Signed)
New to provider, recurrent for pt.  Start abx.  Pt's air movement improved s/p neb tx in office.  Encouraged her to use home inhalers.  Reviewed supportive care and red flags that should prompt return.  Pt expressed understanding and is in agreement w/ plan.

## 2014-02-15 NOTE — Patient Instructions (Signed)
Follow up as needed Start the Amoxicillin twice daily- take w/ food Drink plenty of fluids Use the cough syrup for nights/weekends- will cause drowsiness Use Mucinex DM for daytime cough Use your albuterol inhaler as needed for shortness of breath/wheezing Call with any questions or concerns Hang in there!

## 2014-02-26 ENCOUNTER — Encounter: Payer: Self-pay | Admitting: Internal Medicine

## 2014-02-26 ENCOUNTER — Ambulatory Visit (INDEPENDENT_AMBULATORY_CARE_PROVIDER_SITE_OTHER): Payer: Medicare Other | Admitting: Internal Medicine

## 2014-02-26 ENCOUNTER — Telehealth: Payer: Self-pay | Admitting: Internal Medicine

## 2014-02-26 VITALS — BP 120/72 | HR 76 | Temp 97.8°F | Wt 230.5 lb

## 2014-02-26 DIAGNOSIS — R7302 Impaired glucose tolerance (oral): Secondary | ICD-10-CM

## 2014-02-26 DIAGNOSIS — G5600 Carpal tunnel syndrome, unspecified upper limb: Secondary | ICD-10-CM

## 2014-02-26 DIAGNOSIS — J45909 Unspecified asthma, uncomplicated: Secondary | ICD-10-CM

## 2014-02-26 DIAGNOSIS — G5601 Carpal tunnel syndrome, right upper limb: Secondary | ICD-10-CM

## 2014-02-26 DIAGNOSIS — E538 Deficiency of other specified B group vitamins: Secondary | ICD-10-CM

## 2014-02-26 DIAGNOSIS — R7309 Other abnormal glucose: Secondary | ICD-10-CM

## 2014-02-26 DIAGNOSIS — Z0000 Encounter for general adult medical examination without abnormal findings: Secondary | ICD-10-CM

## 2014-02-26 DIAGNOSIS — J0110 Acute frontal sinusitis, unspecified: Secondary | ICD-10-CM

## 2014-02-26 DIAGNOSIS — M25562 Pain in left knee: Secondary | ICD-10-CM

## 2014-02-26 DIAGNOSIS — M25569 Pain in unspecified knee: Secondary | ICD-10-CM

## 2014-02-26 DIAGNOSIS — I10 Essential (primary) hypertension: Secondary | ICD-10-CM

## 2014-02-26 DIAGNOSIS — M6530 Trigger finger, unspecified finger: Secondary | ICD-10-CM

## 2014-02-26 MED ORDER — CYANOCOBALAMIN 1000 MCG/ML IJ SOLN
1000.0000 ug | Freq: Once | INTRAMUSCULAR | Status: AC
  Administered 2014-02-26: 1000 ug via INTRAMUSCULAR

## 2014-02-26 MED ORDER — ATENOLOL 50 MG PO TABS: 50.0000 mg | ORAL_TABLET | Freq: Every day | ORAL | Status: DC

## 2014-02-26 NOTE — Assessment & Plan Note (Signed)
Failed otc b12 oral replacement tx, to re-start b12 1000 IM q mo

## 2014-02-26 NOTE — Addendum Note (Signed)
Addended by: Scharlene GlossEWING, Danil Wedge B on: 02/26/2014 09:04 AM   Modules accepted: Orders

## 2014-02-26 NOTE — Telephone Encounter (Signed)
Relevant patient education mailed to patient.  

## 2014-02-26 NOTE — Assessment & Plan Note (Signed)
Overall improved, to f/u Dr Madelin Rearlin/ortho as planned

## 2014-02-26 NOTE — Assessment & Plan Note (Signed)
Ongoing, again declines hand surgury referral, just does not want more surgury

## 2014-02-26 NOTE — Assessment & Plan Note (Signed)
stable overall by history and exam, recent data reviewed with pt, and pt to continue medical treatment as before,  to f/u any worsening symptoms or concerns BP Readings from Last 3 Encounters:  02/26/14 120/72  02/15/14 150/78  11/26/13 122/60

## 2014-02-26 NOTE — Assessment & Plan Note (Signed)
Right 4th and 5th fingers, mild worsening, declines hand surgury referral at this time

## 2014-02-26 NOTE — Progress Notes (Signed)
Pre visit review using our clinic review tool, if applicable. No additional management support is needed unless otherwise documented below in the visit note. 

## 2014-02-26 NOTE — Assessment & Plan Note (Signed)
Resolved,  to f/u any worsening symptoms or concerns  

## 2014-02-26 NOTE — Patient Instructions (Signed)
You had the B12 shot today  Please return monthly for a Nurse Visit for the B12 shots in the future (indefinite)  Please continue all other medications as before, and refills have been done if requested. Please have the pharmacy call with any other refills you may need.  Please keep your appointments with your specialists as you may have planned - Dr Charlann Boxerlin  Please call if you change your mind about referral to hand surgeon, Dr Merlyn LotKuzma  Please remember to sign up for MyChart if you have not done so, as this will be important to you in the future with finding out test results, communicating by private email, and scheduling acute appointments online when needed.  Please return in 3 months, or sooner if needed, with Lab testing done 3-5 days before

## 2014-02-26 NOTE — Progress Notes (Signed)
Subjective:    Patient ID: [name], female    DOB: 04-26-1941, 73 y.o.   MRN: [id number]  HPI  Here to f/u, [facility] requiring [facility] tx with what sounds like sinusitis, almost finished amoxil course, overall better,  Pt denies fever, wt loss, night sweats, loss of appetite, or other constitutional symptoms.  Pt denies chest pain, increased sob or doe, wheezing, orthopnea, PND, increased LE swelling, palpitations, dizziness or syncope. Still with bilat knee pain left > right, but better somewhat after seeing Dr Katrinka BlazingSmith, then Dr Madelin Rearlin/ortho now with bilat knee braces.  C/o walking stiffly, did fall yesterday in an accident trying to sit in a chair, twisted left ankle now with swelling and pain today, mild.  Tends to swell to legs most days as well but mild, worse to be up on her feet. Walks with cane for safety only (not really using).  Has worsening right hand 4th and 5th finger inability to make complete fist or extend fully. Has known right CTS, has seen Dr Merlyn LotKuzma in past and rec'd surgury, but pt declined. Declines to see hand surgeon for now.  Also has persistent low b12 last jan 2015, though good compliance with otc b12 vit.  Past Medical History  Diagnosis Date  . Hx of colonic polyps   . Chronic headaches     Chronic pain  . Hypothyroidism   . Hyperlipidemia   . Anemia     NOS  . DJD (degenerative joint disease)     Spine, hands  . Asthma   . Hearing loss   . Sickle cell trait   . Migraine   . Peptic ulcer disease   . Allergic rhinitis   . DDD (degenerative disc disease), cervical   . CTS (carpal tunnel syndrome) 8/08    Right, severe, emg/ncs; Left, moderate, emg/ncs  . Stenosing tenosynovitis of thumb     Left  . Hypertension   . Diabetes mellitus     Type II-diet  . Vitamin D deficiency   . Peripheral neuropathy   . Disc disease, degenerative, cervical 06/07/2011  . Chronic neck pain 06/07/2011  . B12 deficiency 09/27/2011   Past Surgical History    Procedure Laterality Date  . Knee surgery    . Abdominal hysterectomy      reports that she has never smoked. She has never used smokeless tobacco. She reports that she does not drink alcohol or use illicit drugs. family history includes Arthritis in her other; Diabetes in her mother and other; Hyperlipidemia in her other. Allergies  Allergen Reactions  . Lactose Intolerance (Gi)   . Naproxen    Current Outpatient Prescriptions on File Prior to Visit  Medication Sig Dispense Refill  . albuterol (PROVENTIL HFA;VENTOLIN HFA) 108 (90 BASE) MCG/ACT inhaler Inhale 2 puffs into the lungs every 6 (six) hours as needed for wheezing.  1 Inhaler  3  . alendronate (FOSAMAX) 70 MG tablet Take 1 tablet (70 mg total) by mouth every 7 (seven) days. Take with a full glass of water on an empty stomach.  12 tablet  3  . aspirin 81 MG EC tablet Take 81 mg by mouth daily.        . calcium-vitamin D (OSCAL WITH D) 500-200 MG-UNIT per tablet Take 1 tablet by mouth 2 (two) times daily.       . cetirizine (ZYRTEC) 10 MG tablet Take 10 mg by mouth daily.        .Marland Kitchen  Cod Liver Oil CAPS Take 1 capsule by mouth daily.        . cyanocobalamin (,VITAMIN B-12,) 1000 MCG/ML injection Inject 1,000 mcg into the muscle every 30 (thirty) days.        . ferrous sulfate 325 (65 FE) MG tablet Take 325 mg by mouth daily.        Marland Kitchen. HYDROcodone-acetaminophen (NORCO/VICODIN) 5-325 MG per tablet Take 1 tablet by mouth every 8 (eight) hours as needed. To fill Dec 30, 2013  90 tablet  0  . levocetirizine (XYZAL) 5 MG tablet Take 1 tablet (5 mg total) by mouth every evening.  90 tablet  3  . mometasone-formoterol (DULERA) 100-5 MCG/ACT AERO Inhale 2 puffs into the lungs 2 (two) times daily.  13 g  2  . nystatin cream (MYCOSTATIN) Apply topically 2 (two) times daily.  30 g  1   No current facility-administered medications on file prior to visit.   Review of Systems  Constitutional: Negative for unexpected weight change, or unusual  diaphoresis  HENT: Negative for tinnitus.   Eyes: Negative for photophobia and visual disturbance.  Respiratory: Negative for choking and stridor.   Gastrointestinal: Negative for vomiting and blood in stool.  Genitourinary: Negative for hematuria and decreased urine volume.  Musculoskeletal: Negative for acute joint swelling Skin: Negative for color change and wound.  Neurological: Negative for tremors and numbness other than noted  Psychiatric/Behavioral: Negative for decreased concentration or  hyperactivity.       Objective:   Physical Exam BP 120/72  Pulse 76  Temp(Src) 97.8 F (36.6 C) (Oral)  Wt 230 lb 8 oz (104.554 kg)  SpO2 96% VS noted,  Constitutional: Pt appears well-developed and well-nourished.  HENT: Head: NCAT.  Right Ear: External ear normal.  Left Ear: External ear normal.  Eyes: Conjunctivae and EOM are normal. Pupils are equal, round, and reactive to light.  Neck: Normal range of motion. Neck supple.  Cardiovascular: Normal rate and regular rhythm.   Pulmonary/Chest: Effort normal and breath sounds without rales or wheezing  Left ankle with mild tender, trace effusion Neurological: Pt is alert. Not confused  Skin: Skin is warm. No erythema.  Psychiatric: Pt behavior is normal. Thought content normal.     Assessment & Plan:

## 2014-02-26 NOTE — Assessment & Plan Note (Signed)
Stable, though has trouble affording the dulera,  to f/u any worsening symptoms or concerns

## 2014-03-06 ENCOUNTER — Telehealth: Payer: Self-pay

## 2014-03-06 NOTE — Telephone Encounter (Signed)
Error

## 2014-04-01 ENCOUNTER — Telehealth: Payer: Self-pay | Admitting: *Deleted

## 2014-04-01 ENCOUNTER — Ambulatory Visit (INDEPENDENT_AMBULATORY_CARE_PROVIDER_SITE_OTHER): Payer: Medicare Other | Admitting: *Deleted

## 2014-04-01 DIAGNOSIS — E538 Deficiency of other specified B group vitamins: Secondary | ICD-10-CM

## 2014-04-01 MED ORDER — CYANOCOBALAMIN 1000 MCG/ML IJ SOLN
1000.0000 ug | Freq: Once | INTRAMUSCULAR | Status: AC
  Administered 2014-04-01: 1000 ug via INTRAMUSCULAR

## 2014-04-01 NOTE — Telephone Encounter (Signed)
noted 
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
## 2014-05-02 ENCOUNTER — Ambulatory Visit (INDEPENDENT_AMBULATORY_CARE_PROVIDER_SITE_OTHER): Payer: Medicare Other

## 2014-05-02 DIAGNOSIS — E538 Deficiency of other specified B group vitamins: Secondary | ICD-10-CM

## 2014-05-02 MED ORDER — CYANOCOBALAMIN 1000 MCG/ML IJ SOLN
1000.0000 ug | Freq: Once | INTRAMUSCULAR | Status: AC
  Administered 2014-05-02: 1000 ug via INTRAMUSCULAR

## 2014-05-28 ENCOUNTER — Ambulatory Visit: Payer: Medicare Other | Admitting: Internal Medicine

## 2014-06-03 ENCOUNTER — Ambulatory Visit (INDEPENDENT_AMBULATORY_CARE_PROVIDER_SITE_OTHER): Payer: Medicare Other | Admitting: Internal Medicine

## 2014-06-03 ENCOUNTER — Encounter: Payer: Self-pay | Admitting: Internal Medicine

## 2014-06-03 ENCOUNTER — Other Ambulatory Visit (INDEPENDENT_AMBULATORY_CARE_PROVIDER_SITE_OTHER): Payer: Medicare Other

## 2014-06-03 VITALS — BP 110/70 | HR 60 | Temp 98.1°F | Wt 221.5 lb

## 2014-06-03 DIAGNOSIS — E538 Deficiency of other specified B group vitamins: Secondary | ICD-10-CM

## 2014-06-03 DIAGNOSIS — E785 Hyperlipidemia, unspecified: Secondary | ICD-10-CM

## 2014-06-03 DIAGNOSIS — N182 Chronic kidney disease, stage 2 (mild): Secondary | ICD-10-CM

## 2014-06-03 DIAGNOSIS — R7309 Other abnormal glucose: Secondary | ICD-10-CM

## 2014-06-03 DIAGNOSIS — G894 Chronic pain syndrome: Secondary | ICD-10-CM

## 2014-06-03 DIAGNOSIS — Z0000 Encounter for general adult medical examination without abnormal findings: Secondary | ICD-10-CM

## 2014-06-03 DIAGNOSIS — R7302 Impaired glucose tolerance (oral): Secondary | ICD-10-CM

## 2014-06-03 LAB — CBC WITH DIFFERENTIAL/PLATELET
BASOS PCT: 0.4 % (ref 0.0–3.0)
Basophils Absolute: 0 10*3/uL (ref 0.0–0.1)
EOS ABS: 0.1 10*3/uL (ref 0.0–0.7)
Eosinophils Relative: 2.6 % (ref 0.0–5.0)
HCT: 36.3 % (ref 36.0–46.0)
Hemoglobin: 12.2 g/dL (ref 12.0–15.0)
Lymphocytes Relative: 27.5 % (ref 12.0–46.0)
Lymphs Abs: 1.5 10*3/uL (ref 0.7–4.0)
MCHC: 33.7 g/dL (ref 30.0–36.0)
MCV: 87.3 fl (ref 78.0–100.0)
MONO ABS: 0.5 10*3/uL (ref 0.1–1.0)
Monocytes Relative: 9.9 % (ref 3.0–12.0)
Neutro Abs: 3.2 10*3/uL (ref 1.4–7.7)
Neutrophils Relative %: 59.6 % (ref 43.0–77.0)
PLATELETS: 143 10*3/uL — AB (ref 150.0–400.0)
RBC: 4.16 Mil/uL (ref 3.87–5.11)
RDW: 13.3 % (ref 11.5–15.5)
WBC: 5.4 10*3/uL (ref 4.0–10.5)

## 2014-06-03 LAB — URINALYSIS, ROUTINE W REFLEX MICROSCOPIC
BILIRUBIN URINE: NEGATIVE
KETONES UR: NEGATIVE
NITRITE: NEGATIVE
Specific Gravity, Urine: 1.01 (ref 1.000–1.030)
TOTAL PROTEIN, URINE-UPE24: NEGATIVE
Urine Glucose: NEGATIVE
Urobilinogen, UA: 0.2 (ref 0.0–1.0)
pH: 6 (ref 5.0–8.0)

## 2014-06-03 LAB — LIPID PANEL
Cholesterol: 167 mg/dL (ref 0–200)
HDL: 39.9 mg/dL (ref >39.00)
LDL Cholesterol: 99 mg/dL (ref 0–99)
NonHDL: 127.1
TRIGLYCERIDES: 140 mg/dL (ref 0.0–149.0)
Total CHOL/HDL Ratio: 4
VLDL: 28 mg/dL (ref 0.0–40.0)

## 2014-06-03 LAB — BASIC METABOLIC PANEL
BUN: 17 mg/dL (ref 6–23)
CHLORIDE: 101 meq/L (ref 96–112)
CO2: 28 mEq/L (ref 19–32)
Calcium: 9.5 mg/dL (ref 8.4–10.5)
Creatinine, Ser: 1.8 mg/dL — ABNORMAL HIGH (ref 0.4–1.2)
GFR: 34.61 mL/min — ABNORMAL LOW (ref >60.00)
GLUCOSE: 120 mg/dL — AB (ref 70–99)
POTASSIUM: 3.5 meq/L (ref 3.5–5.1)
Sodium: 139 mEq/L (ref 135–145)

## 2014-06-03 LAB — HEPATIC FUNCTION PANEL
ALT: 16 U/L (ref 0–35)
AST: 28 U/L (ref 0–37)
Albumin: 4.2 g/dL (ref 3.5–5.2)
Alkaline Phosphatase: 61 U/L (ref 39–117)
BILIRUBIN TOTAL: 0.7 mg/dL (ref 0.2–1.2)
Bilirubin, Direct: 0.1 mg/dL (ref 0.0–0.3)
Total Protein: 8 g/dL (ref 6.0–8.3)

## 2014-06-03 LAB — TSH: TSH: 14.3 u[IU]/mL — ABNORMAL HIGH (ref 0.35–4.50)

## 2014-06-03 LAB — HEMOGLOBIN A1C: Hgb A1c MFr Bld: 5.9 % (ref 4.6–6.5)

## 2014-06-03 MED ORDER — MELOXICAM 15 MG PO TABS: 15.0000 mg | ORAL_TABLET | Freq: Every day | ORAL | Status: DC

## 2014-06-03 MED ORDER — CYANOCOBALAMIN 1000 MCG/ML IJ SOLN
1000.0000 ug | Freq: Once | INTRAMUSCULAR | Status: AC
  Administered 2014-06-03: 1000 ug via INTRAMUSCULAR

## 2014-06-03 MED ORDER — ALENDRONATE SODIUM 70 MG PO TABS: 70.0000 mg | ORAL_TABLET | ORAL | Status: DC

## 2014-06-03 NOTE — Progress Notes (Signed)
Subjective:    Patient ID: Westley GamblesJoyce B Piacentini, female    DOB: [birth year], 73 y.o.   MRN: [id number]  HPI  Here for wellness and f/u;  Overall doing ok;  Pt denies CP, worsening SOB, DOE, wheezing, orthopnea, PND, worsening LE edema, palpitations, dizziness or syncope.  Pt denies neurological change such as new headache, facial or extremity weakness.  Pt denies polydipsia, polyuria, or low sugar symptoms. Pt states overall good compliance with treatment and medications, good tolerability, and has been trying to follow lower cholesterol diet.  Pt denies worsening depressive symptoms, suicidal ideation or panic. No fever, night sweats, wt loss, loss of appetite, or other constitutional symptoms.  Pt states good ability with ADL's, has low fall risk, home safety reviewed and adequate, no other significant changes in hearing or vision, and only occasionally active with exercise.  Due for B12 today,  Fosamax has been expensive, did not know about the [facility] $4 list, has taken only intermittently.  Also with varisou msk pain, including 4th right palmar tendonitis (holding off on hand surgury referral per pt due to cost), right shoulder and right lower back aching. Past Medical History  Diagnosis Date  . Hx of colonic polyps   . Chronic headaches     Chronic pain  . Hypothyroidism   . Hyperlipidemia   . Anemia     NOS  . DJD (degenerative joint disease)     Spine, hands  . Asthma   . Hearing loss   . Sickle cell trait   . Migraine   . Peptic ulcer disease   . Allergic rhinitis   . DDD (degenerative disc disease), cervical   . CTS (carpal tunnel syndrome) 8/08    Right, severe, emg/ncs; Left, moderate, emg/ncs  . Stenosing tenosynovitis of thumb     Left  . Hypertension   . Diabetes mellitus     Type II-diet  . Vitamin D deficiency   . Peripheral neuropathy   . Disc disease, degenerative, cervical 06/07/2011  . Chronic neck pain 06/07/2011  . B12 deficiency 09/27/2011   Past Surgical History    Procedure Laterality Date  . Knee surgery    . Abdominal hysterectomy      reports that she has never smoked. She has never used smokeless tobacco. She reports that she does not drink alcohol or use illicit drugs. family history includes Arthritis in her other; Diabetes in her mother and other; Hyperlipidemia in her other. Allergies  Allergen Reactions  . Lactose Intolerance (Gi)   . Naproxen    Current Outpatient Prescriptions on File Prior to Visit  Medication Sig Dispense Refill  . albuterol (PROVENTIL HFA;VENTOLIN HFA) 108 (90 BASE) MCG/ACT inhaler Inhale 2 puffs into the lungs every 6 (six) hours as needed for wheezing.  1 Inhaler  3  . aspirin 81 MG EC tablet Take 81 mg by mouth daily.        Marland Kitchen. atenolol (TENORMIN) 50 MG tablet Take 1 tablet (50 mg total) by mouth daily.  90 tablet  3  . calcium-vitamin D (OSCAL WITH D) 500-200 MG-UNIT per tablet Take 1 tablet by mouth 2 (two) times daily.       . cetirizine (ZYRTEC) 10 MG tablet Take 10 mg by mouth daily.        Marland Kitchen. Cod Liver Oil CAPS Take 1 capsule by mouth daily.        . cyanocobalamin (,VITAMIN B-12,) 1000 MCG/ML injection Inject 1,000 mcg into the muscle every 30 (  thirty) days.        . ferrous sulfate 325 (65 FE) MG tablet Take 325 mg by mouth daily.        Marland Kitchen levocetirizine (XYZAL) 5 MG tablet Take 1 tablet (5 mg total) by mouth every evening.  90 tablet  3  . mometasone-formoterol (DULERA) 100-5 MCG/ACT AERO Inhale 2 puffs into the lungs 2 (two) times daily.  13 g  2  . nystatin cream (MYCOSTATIN) Apply topically 2 (two) times daily.  30 g  1  . alendronate (FOSAMAX) 70 MG tablet Take 1 tablet (70 mg total) by mouth every 7 (seven) days. Take with a full glass of water on an empty stomach.  12 tablet  3   No current facility-administered medications on file prior to visit.    Review of Systems Constitutional: Negative for increased diaphoresis, other activity, appetite or other siginficant weight change  HENT: Negative for  worsening hearing loss, ear pain, facial swelling, mouth sores and neck stiffness.   Eyes: Negative for other worsening pain, redness or visual disturbance.  Respiratory: Negative for shortness of breath and wheezing.   Cardiovascular: Negative for chest pain and palpitations.  Gastrointestinal: Negative for diarrhea, blood in stool, abdominal distention or other pain Genitourinary: Negative for hematuria, flank pain or change in urine volume.  Musculoskeletal: Negative for myalgias or other joint complaints.  Skin: Negative for color change and wound.  Neurological: Negative for syncope and numbness. other than noted Hematological: Negative for adenopathy. or other swelling Psychiatric/Behavioral: Negative for hallucinations, self-injury, decreased concentration or other worsening agitation.      Objective:   Physical Exam BP 110/70  Pulse 60  Temp(Src) 98.1 F (36.7 C) (Oral)  Wt 221 lb 8 oz (100.472 kg)  SpO2 94% VS noted,  Constitutional: Pt is oriented to person, place, and time. Appears well-developed and well-nourished.  Head: Normocephalic and atraumatic.  Right Ear: External ear normal.  Left Ear: External ear normal.  Nose: Nose normal.  Mouth/Throat: Oropharynx is clear and moist.  Eyes: Conjunctivae and EOM are normal. Pupils are equal, round, and reactive to light.  Neck: Normal range of motion. Neck supple. No JVD present. No tracheal deviation present.  Cardiovascular: Normal rate, regular rhythm, normal heart sounds and intact distal pulses.   Pulmonary/Chest: Effort normal and breath sounds without rales or wheezing  Abdominal: Soft. Bowel sounds are normal. NT. No HSM  Musculoskeletal: Normal range of motion. Exhibits no edema.  Lymphadenopathy:  Has no cervical adenopathy.  Neurological: Pt is alert and oriented to person, place, and time. Pt has normal reflexes. No cranial nerve deficit. Motor grossly intact Skin: Skin is warm and dry. No rash noted.    Psychiatric:  Has normal mood and affect. Behavior is normal.  Spine nontender    Assessment & Plan:

## 2014-06-03 NOTE — Assessment & Plan Note (Signed)
stable overall by history and exam, recent data reviewed with pt, and pt to continue medical treatment as before,  to f/u any worsening symptoms or concerns Lab Results  Component Value Date   HGBA1C 5.8 11/20/2013

## 2014-06-03 NOTE — Addendum Note (Signed)
Addended by: Scharlene GlossEWING, ROBIN B on: 06/03/2014 09:49 AM   Modules accepted: Orders

## 2014-06-03 NOTE — Assessment & Plan Note (Signed)
Ok for mobic prn, watch renal fxn,  to f/u any worsening symptoms or concerns

## 2014-06-03 NOTE — Assessment & Plan Note (Signed)
With f/u labs today

## 2014-06-03 NOTE — Progress Notes (Signed)
Pre visit review using our clinic review tool, if applicable. No additional management support is needed unless otherwise documented below in the visit note. 

## 2014-06-03 NOTE — Assessment & Plan Note (Signed)

## 2014-06-03 NOTE — Patient Instructions (Signed)
You had the b12 shot today  Please take all new medication as prescribed - the mobic for pain  Please continue all other medications as before, and refills have been done if requested.  Please have the pharmacy call with any other refills you may need.  Please continue your efforts at being more active, low cholesterol diet, and weight control.  You are otherwise up to date with prevention measures today.  Please keep your appointments with your specialists as you may have planned  Please go to the LAB in the Basement (turn left off the elevator) for the tests to be done today  You will be contacted by phone if any changes need to be made immediately.  Otherwise, you will receive a letter about your results with an explanation, but please check with MyChart first.  Please return in 6 months, or sooner if needed

## 2014-06-03 NOTE — Assessment & Plan Note (Signed)
For IM replacement today 

## 2014-06-04 ENCOUNTER — Other Ambulatory Visit: Payer: Self-pay | Admitting: Internal Medicine

## 2014-06-04 ENCOUNTER — Ambulatory Visit: Payer: Medicare Other

## 2014-06-04 ENCOUNTER — Encounter: Payer: Self-pay | Admitting: Internal Medicine

## 2014-06-04 DIAGNOSIS — E038 Other specified hypothyroidism: Secondary | ICD-10-CM

## 2014-06-04 LAB — T4, FREE: FREE T4: 0.53 ng/dL — AB (ref 0.60–1.60)

## 2014-06-04 MED ORDER — LEVOTHYROXINE SODIUM 50 MCG PO TABS: 50.0000 ug | ORAL_TABLET | Freq: Every day | ORAL | Status: DC

## 2014-06-05 ENCOUNTER — Telehealth: Payer: Self-pay | Admitting: Internal Medicine

## 2014-06-05 DIAGNOSIS — N289 Disorder of kidney and ureter, unspecified: Secondary | ICD-10-CM

## 2014-06-05 NOTE — Telephone Encounter (Signed)
Message copied by Corwin LevinsJOHN, Adolf Ormiston W on Thu Jun 05, 2014  7:51 PM ------      Message from: Scharlene GlossEWING, ROBIN B      Created: Thu Jun 05, 2014  3:20 PM       Informed the patient of MD response to pain medication.  The patient did agree to take just tylenol and possible see Dr. Katrinka BlazingSmith. The patient is concerned as to what has caused her kidneys to worsen, if it is medication she would like to know so she can stop?  Also what would help her kidneys? ------

## 2014-06-05 NOTE — Telephone Encounter (Signed)
Reason is not clear, should also be referred to nephrology - will do

## 2014-06-06 NOTE — Telephone Encounter (Signed)
Notified pt with md response.../lmb 

## 2014-08-07 ENCOUNTER — Encounter: Payer: Self-pay | Admitting: Internal Medicine

## 2014-08-07 ENCOUNTER — Other Ambulatory Visit: Payer: Self-pay | Admitting: Internal Medicine

## 2014-08-07 ENCOUNTER — Other Ambulatory Visit (INDEPENDENT_AMBULATORY_CARE_PROVIDER_SITE_OTHER): Payer: Medicare Other

## 2014-08-07 ENCOUNTER — Ambulatory Visit (INDEPENDENT_AMBULATORY_CARE_PROVIDER_SITE_OTHER): Payer: Medicare Other | Admitting: Internal Medicine

## 2014-08-07 VITALS — BP 110/62 | HR 68 | Temp 98.4°F | Ht 65.0 in | Wt 226.5 lb

## 2014-08-07 DIAGNOSIS — M5416 Radiculopathy, lumbar region: Secondary | ICD-10-CM

## 2014-08-07 DIAGNOSIS — E039 Hypothyroidism, unspecified: Secondary | ICD-10-CM

## 2014-08-07 DIAGNOSIS — N183 Chronic kidney disease, stage 3 unspecified: Secondary | ICD-10-CM

## 2014-08-07 DIAGNOSIS — M25559 Pain in unspecified hip: Secondary | ICD-10-CM

## 2014-08-07 DIAGNOSIS — M25551 Pain in right hip: Secondary | ICD-10-CM

## 2014-08-07 DIAGNOSIS — Z23 Encounter for immunization: Secondary | ICD-10-CM

## 2014-08-07 DIAGNOSIS — IMO0002 Reserved for concepts with insufficient information to code with codable children: Secondary | ICD-10-CM

## 2014-08-07 DIAGNOSIS — E538 Deficiency of other specified B group vitamins: Secondary | ICD-10-CM

## 2014-08-07 LAB — BASIC METABOLIC PANEL
BUN: 19 mg/dL (ref 6–23)
CO2: 31 mEq/L (ref 19–32)
Calcium: 9.4 mg/dL (ref 8.4–10.5)
Chloride: 107 mEq/L (ref 96–112)
Creatinine, Ser: 1.7 mg/dL — ABNORMAL HIGH (ref 0.4–1.2)
GFR: 37.39 mL/min — AB (ref >60.00)
Glucose, Bld: 96 mg/dL (ref 70–99)
POTASSIUM: 4.7 meq/L (ref 3.5–5.1)
SODIUM: 141 meq/L (ref 135–145)

## 2014-08-07 LAB — TSH: TSH: 8.1 u[IU]/mL — AB (ref 0.35–4.50)

## 2014-08-07 MED ORDER — GABAPENTIN 100 MG PO CAPS: 100.0000 mg | ORAL_CAPSULE | Freq: Three times a day (TID) | ORAL | Status: DC

## 2014-08-07 MED ORDER — TIZANIDINE HCL 4 MG PO TABS: 4.0000 mg | ORAL_TABLET | Freq: Four times a day (QID) | ORAL | Status: DC | PRN

## 2014-08-07 MED ORDER — CYANOCOBALAMIN 1000 MCG/ML IJ SOLN
1000.0000 ug | Freq: Once | INTRAMUSCULAR | Status: AC
  Administered 2014-08-07: 1000 ug via INTRAMUSCULAR

## 2014-08-07 MED ORDER — LEVOTHYROXINE SODIUM 88 MCG PO TABS: 88.0000 ug | ORAL_TABLET | Freq: Every day | ORAL | Status: DC

## 2014-08-07 NOTE — Progress Notes (Signed)
Pre visit review using our clinic review tool, if applicable. No additional management support is needed unless otherwise documented below in the visit note. 

## 2014-08-07 NOTE — Assessment & Plan Note (Signed)
Asympt, now on low dose thyroid replacement, for f/u lab today Lab Results  Component Value Date   TSH 14.30* 06/03/2014

## 2014-08-07 NOTE — Assessment & Plan Note (Signed)
Off mobic for several wks - for f/u bmet

## 2014-08-07 NOTE — Assessment & Plan Note (Signed)
For b12 shot today 

## 2014-08-07 NOTE — Patient Instructions (Addendum)
You had the flu shot today, and the B12 shot  Please take all new medication as prescribed - the gabapentin at low dose 100 mg three times per day, as well as the muscle relaxer as needed  If the gabapentin only helps a little, please call as this can be increased after 3-5 days of trying that dose  You will be contacted regarding the referral for: MRI for lower back  You will be contacted regarding the referral for: Dr Smith/sport medicine  We can also consider referral back to Dr Sherene Sires group if the MRI is abnormal  Please go to the LAB in the Basement (turn left off the elevator) for the tests to be done today  You will be contacted by phone if any changes need to be made immediately.  Otherwise, you will receive a letter about your results with an explanation, but please check with MyChart first.  Please remember to sign up for MyChart if you have not done so, as this will be important to you in the future with finding out test results, communicating by private email, and scheduling acute appointments online when needed.

## 2014-08-07 NOTE — Assessment & Plan Note (Signed)
Exam c/w bursitis, for tylenol pnr, avoid lying on right side, for sport med referral as well/Dr Katrinka Blazing

## 2014-08-07 NOTE — Progress Notes (Signed)
Subjective:    Patient ID: [name], female    DOB: 05/05/1941, 73 y.o.   MRN: [id number]  HPI  Here to f/u with worsening right LBP, with spasm like pain, and even crepitus like sensations to the right back, with radiation to the RLE to slightly below the knee,, overall worse in the past week, acute on chronic, assoc with numbness/tingling to all distal extremities, possibly mild worsening weakness to RLE, Pt without  bowel or bladder change, fever, wt loss, but has has to adjust her gait. Worse to lie on the right side. Also chronic left knee pain with popping and ? Tendency to giveaway, but no falls as she is careful to walk with cane or stay near furniture to assist.  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  Not taking current pain meds, tylenol only. No mobic since her most recent lab work.  Due for f/u lab work, Not yet seen renal.  Has seen Dr Thurston Hole in past for lower back pain with remote films. Pt had declines MRI at that time due to cost.  Does not want narcotic for pain overall. Past Medical History  Diagnosis Date  . Hx of colonic polyps   . Chronic headaches     Chronic pain  . Hypothyroidism   . Hyperlipidemia   . Anemia     NOS  . DJD (degenerative joint disease)     Spine, hands  . Asthma   . Hearing loss   . Sickle cell trait   . Migraine   . Peptic ulcer disease   . Allergic rhinitis   . DDD (degenerative disc disease), cervical   . CTS (carpal tunnel syndrome) 8/08    Right, severe, emg/ncs; Left, moderate, emg/ncs  . Stenosing tenosynovitis of thumb     Left  . Hypertension   . Diabetes mellitus     Type II-diet  . Vitamin D deficiency   . Peripheral neuropathy   . Disc disease, degenerative, cervical 06/07/2011  . Chronic neck pain 06/07/2011  . B12 deficiency 09/27/2011   Past Surgical History  Procedure Laterality Date  . Knee surgery    . Abdominal hysterectomy      reports that she has never smoked. She has never used smokeless tobacco. She reports that she does not drink alcohol or use illicit drugs. family history includes Arthritis in her other; Diabetes in her mother and other; Hyperlipidemia in her other. Allergies  Allergen Reactions  . Lactose Intolerance (Gi)   . Naproxen    Current Outpatient Prescriptions on File Prior to Visit  Medication Sig Dispense Refill  . alendronate (FOSAMAX) 70 MG tablet Take 1 tablet (70 mg total) by mouth every 7 (seven) days. Take with a full glass of water on an empty stomach.  12 tablet  3  . aspirin 81 MG EC tablet Take 81 mg by mouth daily.        Marland Kitchen atenolol (TENORMIN) 50 MG tablet Take 1 tablet (50 mg total) by mouth daily.  90 tablet  3  . calcium-vitamin D (OSCAL WITH D) 500-200 MG-UNIT per tablet Take 1 tablet by mouth 2 (two) times daily.       . cetirizine (ZYRTEC) 10 MG tablet Take 10 mg by mouth daily.        Marland Kitchen Cod Liver Oil CAPS Take 1 capsule by mouth daily.        Marland Kitchen  cyanocobalamin (,VITAMIN B-12,) 1000 MCG/ML injection Inject 1,000 mcg into the muscle every 30 (thirty) days.        . ferrous sulfate 325 (65 FE) MG tablet Take 325 mg by mouth daily.        Marland Kitchen levothyroxine (SYNTHROID, LEVOTHROID) 50 MCG tablet Take 1 tablet (50 mcg total) by mouth daily.  90 tablet  3  . mometasone-formoterol (DULERA) 100-5 MCG/ACT AERO Inhale 2 puffs into the lungs 2 (two) times daily.  13 g  2  . nystatin cream (MYCOSTATIN) Apply topically 2 (two) times daily.  30 g  1  . albuterol (PROVENTIL HFA;VENTOLIN HFA) 108 (90 BASE) MCG/ACT inhaler Inhale 2 puffs into the lungs every 6 (six) hours as needed for wheezing.  1 Inhaler  3   No current facility-administered medications on file prior to visit.   Review of Systems  Constitutional: Negative for unusual diaphoresis or other sweats  HENT: Negative for ringing in ear Eyes: Negative for double vision or worsening visual disturbance.  Respiratory: Negative for choking  and stridor.   Gastrointestinal: Negative for vomiting or other signifcant bowel change Genitourinary: Negative for hematuria or decreased urine volume.  Musculoskeletal: Negative for other MSK pain or swelling Skin: Negative for color change and worsening wound.  Neurological: Negative for tremors and numbness other than noted  Psychiatric/Behavioral: Negative for decreased concentration or agitation other than above       Objective:   Physical Exam BP 110/62  Pulse 68  Temp(Src) 98.4 F (36.9 C) (Oral)  Ht  (1.651 m)  Wt 226 lb 8 oz (102.74 kg)  BMI 37.69 kg/m2  SpO2 97%  VS noted,  Constitutional: Pt appears well-developed, well-nourished.  HENT: Head: NCAT.  Right Ear: External ear normal.  Left Ear: External ear normal.  Eyes: . Pupils are equal, round, and reactive to light. Conjunctivae and EOM are normal Neck: Normal range of motion. Neck supple.  Cardiovascular: Normal rate and regular rhythm.   Pulmonary/Chest: Effort normal and breath sounds normal.  Abd:  Soft, NT, ND, + BS Neurological: Pt is alert. Not confused , motor intact except 4+ /5 distal RLE strength and absent patellar and achilles reflex, exam o/w not accurate on LLE due to left knee djd and pain with exam - dtr's trace to 1+ LLE Spine nontender Has right lumbar paravertebral tender, as well as right lateral hip pain over the greater trochanter Skin: Skin is warm. No rash, No LE edema Psychiatric: Pt behavior is normal. No agitation.     Assessment & Plan:

## 2014-08-07 NOTE — Assessment & Plan Note (Addendum)
Also with ? Right muscle spasm - for tizanidine, but as with neuro change, will need MRI LS spine, consider referral back to ortho/Dr Wainer/s group  Note:  Total time for pt hx, exam, review of record with pt in the room, determination of diagnoses and plan for further eval and tx is > 40 min, with over 50% spent in coordination and counseling of patient

## 2014-08-08 ENCOUNTER — Ambulatory Visit (INDEPENDENT_AMBULATORY_CARE_PROVIDER_SITE_OTHER): Payer: Medicare Other | Admitting: *Deleted

## 2014-08-08 DIAGNOSIS — Z23 Encounter for immunization: Secondary | ICD-10-CM

## 2014-08-11 ENCOUNTER — Encounter: Payer: Self-pay | Admitting: *Deleted

## 2014-08-11 LAB — TB SKIN TEST
Induration: 0 mm
TB Skin Test: NEGATIVE

## 2014-08-25 ENCOUNTER — Ambulatory Visit: Payer: Medicare Other | Admitting: Family Medicine

## 2014-09-03 ENCOUNTER — Other Ambulatory Visit: Payer: Medicare Other

## 2014-09-04 ENCOUNTER — Ambulatory Visit
Admission: RE | Admit: 2014-09-04 | Discharge: 2014-09-04 | Disposition: A | Discharge status: Home / Self Care | Payer: Medicare Other | Source: Ambulatory Visit | Attending: Internal Medicine | Admitting: Internal Medicine

## 2014-09-04 DIAGNOSIS — M5416 Radiculopathy, lumbar region: Secondary | ICD-10-CM

## 2014-09-05 ENCOUNTER — Other Ambulatory Visit (INDEPENDENT_AMBULATORY_CARE_PROVIDER_SITE_OTHER): Payer: Medicare Other

## 2014-09-05 ENCOUNTER — Ambulatory Visit (INDEPENDENT_AMBULATORY_CARE_PROVIDER_SITE_OTHER): Payer: Medicare Other | Admitting: Family Medicine

## 2014-09-05 ENCOUNTER — Encounter: Payer: Self-pay | Admitting: Family Medicine

## 2014-09-05 ENCOUNTER — Encounter: Payer: Self-pay | Admitting: Internal Medicine

## 2014-09-05 VITALS — BP 104/62 | HR 52 | Ht 65.0 in | Wt 230.0 lb

## 2014-09-05 DIAGNOSIS — M7061 Trochanteric bursitis, right hip: Secondary | ICD-10-CM

## 2014-09-05 DIAGNOSIS — M25551 Pain in right hip: Secondary | ICD-10-CM

## 2014-09-05 NOTE — Progress Notes (Signed)
[name] D.O. [facility] [address] [place], [place] [address] Phone: [phone] Subjective:    I'm seeing this patient by the request  of:  Oliver BarreJames John, MD   CC: Right hip pain.  OZH:YQMVHQIONGHPI:Subjective [name] is a 73 y.o. female coming in with complaint of right hip pain. Patient states that the pain is on the lateral aspect of the hip. Worse when she sleeps on it or put any pressure on it. Worse with going from a seated position is standing but when she starts walking it seems to improve slowly. Pain is so bad she has been walking with a cane. The severity of 9/10. Does radiate down the lateral aspect of her leg. Denies any numbness or weakness. Patient states the medication she has including muscle relaxers, gabapentin and anti-inflammatories have not been beneficial.     Past medical history, social, surgical and family history all reviewed in electronic medical record.   Review of Systems: No headache, visual changes, nausea, vomiting, diarrhea, constipation, dizziness, abdominal pain, skin rash, fevers, chills, night sweats, weight loss, swollen lymph nodes, body aches, joint swelling, muscle aches, chest pain, shortness of breath, mood changes.   Objective Blood pressure 104/62, pulse 52, height 5\' 5"  (1.651 m), weight 230 lb (104.327 kg), SpO2 97.00%.  General: No apparent distress alert and oriented x3 mood and affect normal, dressed appropriately.  HEENT: Pupils equal, extraocular movements intact  Respiratory: Patient's speak in full sentences and does not appear short of breath  Cardiovascular: No lower extremity edema, non tender, no erythema  Skin: Warm dry intact with no signs of infection or rash on extremities or on axial skeleton.  Abdomen: Soft nontender  Neuro: Cranial nerves II through XII are intact, neurovascularly intact in all extremities with 2+ DTRs and 2+ pulses.  Lymph: No lymphadenopathy of posterior or anterior cervical chain or axillae  bilaterally.  Gait normal with good balance and coordination.  MSK:  Non tender with full range of motion and good stability and symmetric strength and tone of shoulders, elbows, wrist,  knee and ankles bilaterally.  Hip: Right ROM IR: 25 Deg, ER: 35 Deg, Flexion: 120 Deg, Extension: 100 Deg, Abduction: 45 Deg, Adduction: 45 Deg Strength IR: 5/5, ER: 5/5, Flexion: 5/5, Extension: 5/5, Abduction: 4/5, Adduction: 5/5 Pelvic alignment unremarkable to inspection and palpation. Standing hip rotation and gait without trendelenburg sign / unsteadiness. Greater trochanter severely tender to palpation No tenderness over piriformis and greater trochanter. + Faber No SI joint tenderness and normal minimal SI movement.   MSK US performed of: Right This study was ordered, performed, and interpreted by Terrilee FilesZach Smith D.O.  Hip: Trochanteric bursa with significant hypoechoic changes and swelling Acetabular labrum visualized and without tears, displacement, or effusion in joint. Femoral neck appears unremarkable without increased power doppler signal along Cortex.  IMPRESSION:  Greater trochanter bursitis   Procedure: Real-time Ultrasound Guided Injection of right greater trochanteric bursitis secondary to patient's body habitus Device: GE Logiq E  Ultrasound guided injection is preferred based studies that show increased duration, increased effect, greater accuracy, decreased procedural pain, increased response rate, and decreased cost with ultrasound guided versus blind injection.  Verbal informed consent obtained.  Time-out conducted.  Noted no overlying erythema, induration, or other signs of local infection.  Skin prepped in a sterile fashion.  Local anesthesia: Topical Ethyl chloride.  With sterile technique and under real time ultrasound guidance:  Greater trochanteric area was visualized and patient's bursa was noted. A  22-gauge 3 inch needle was inserted and 4 cc of 0.5% Marcaine and 1 cc of  Kenalog 40 mg/dL was injected. Pictures taken Completed without difficulty  Pain immediately resolved suggesting accurate placement of the medication.  Advised to call if fevers/chills, erythema, induration, drainage, or persistent bleeding.  Images permanently stored and available for review in the ultrasound unit.  Impression: Technically successful ultrasound guided injection.      Impression and Recommendations:     This case required medical decision making of moderate complexity.

## 2014-09-05 NOTE — Assessment & Plan Note (Signed)
Patient had injection today with near complete resolution of pain. Differential does include lumbar radiculopathy but with patient improving so quickly likely this is the greater trochanter bursitis. Icing protocol, home exercises, and topical anti-inflammatories discussed. Patient does have chronic pain syndrome which will complicate the picture. Patient will followup again in 3-4 weeks.  Spent greater than 25 minutes with patient face-to-face and had greater than 50% of counseling including as described above in assessment and plan.

## 2014-09-05 NOTE — Patient Instructions (Signed)
Good to see you We did an injection today in your hip Ice 20 minutes 2 times daily. Usually after activity and before bed. Exercises 3 times a week.  Continue the vitamins  Call me in 10 days and if not better we can order an epidural.  Otherwise make an appoint in 3 weeks.

## 2014-09-19 ENCOUNTER — Ambulatory Visit: Payer: Medicare Other | Admitting: Family Medicine

## 2014-09-25 ENCOUNTER — Ambulatory Visit (INDEPENDENT_AMBULATORY_CARE_PROVIDER_SITE_OTHER): Payer: Medicare Other | Admitting: Family Medicine

## 2014-09-25 ENCOUNTER — Ambulatory Visit (INDEPENDENT_AMBULATORY_CARE_PROVIDER_SITE_OTHER): Payer: Medicare Other | Admitting: *Deleted

## 2014-09-25 ENCOUNTER — Encounter: Payer: Self-pay | Admitting: Family Medicine

## 2014-09-25 ENCOUNTER — Encounter: Payer: Self-pay | Admitting: *Deleted

## 2014-09-25 VITALS — BP 104/62 | HR 61 | Ht 65.0 in | Wt 230.0 lb

## 2014-09-25 DIAGNOSIS — E538 Deficiency of other specified B group vitamins: Secondary | ICD-10-CM

## 2014-09-25 DIAGNOSIS — M7061 Trochanteric bursitis, right hip: Secondary | ICD-10-CM

## 2014-09-25 DIAGNOSIS — M5416 Radiculopathy, lumbar region: Secondary | ICD-10-CM

## 2014-09-25 MED ORDER — CYANOCOBALAMIN 1000 MCG/ML IJ SOLN
1000.0000 ug | Freq: Once | INTRAMUSCULAR | Status: AC
  Administered 2014-09-25: 1000 ug via INTRAMUSCULAR

## 2014-09-25 NOTE — Progress Notes (Signed)
  [name] D.O. [facility] [address] [place], [place] [address] Phone: [phone] Subjective:    I'm seeing this patient by the request  of:  Oliver BarreJames John, MD   CC: Right hip pain follow up.  FAO:ZHYQMVHQIOHPI:Subjective [name] is a 73 y.o. female coming in with complaint of right hip pain. Patient was seen previously was diagnosed with a greater trochanteric bursitis. Patient did have a steroid injection. Patient states that the pain on the outside of her hip is almost completely resolved. Patient is still having cramping, crunching sensation in her lower back. We know from previous studies that patient does have significant arthritic changes at L4-L5 and L5-S1 as well as and left L3 root impingement. Patient is doing the exercises fairly frequently and has noticed improvement. Patient states that she is approximately 85% better overall. Patient is taking the natural supplementations and is feeling better as well.     Past medical history, social, surgical and family history all reviewed in electronic medical record.   Review of Systems: No headache, visual changes, nausea, vomiting, diarrhea, constipation, dizziness, abdominal pain, skin rash, fevers, chills, night sweats, weight loss, swollen lymph nodes, body aches, joint swelling, muscle aches, chest pain, shortness of breath, mood changes.   Objective Blood pressure 104/62, pulse 61, height 5\' 5"  (1.651 m), weight 230 lb (104.327 kg), SpO2 96 %.  General: No apparent distress alert and oriented x3 mood and affect normal, dressed appropriately.  HEENT: Pupils equal, extraocular movements intact  Respiratory: Patient's speak in full sentences and does not appear short of breath  Cardiovascular: No lower extremity edema, non tender, no erythema  Skin: Warm dry intact with no signs of infection or rash on extremities or on axial skeleton.  Abdomen: Soft nontender  Neuro: Cranial nerves II through XII are intact,  neurovascularly intact in all extremities with 2+ DTRs and 2+ pulses.  Lymph: No lymphadenopathy of posterior or anterior cervical chain or axillae bilaterally.  Gait normal with good balance and coordination.  MSK:  Non tender with full range of motion and good stability and symmetric strength and tone of shoulders, elbows, wrist,  knee and ankles bilaterally.  Hip: Right ROM IR: 25 Deg, ER: 35 Deg, Flexion: 120 Deg, Extension: 100 Deg, Abduction: 45 Deg, Adduction: 45 Deg Strength IR: 5/5, ER: 5/5, Flexion: 5/5, Extension: 5/5, Abduction: 4/5, Adduction: 5/5 Pelvic alignment unremarkable to inspection and palpation. Standing hip rotation and gait without trendelenburg sign / unsteadiness. Greater trochanter nontender on exam today No tenderness over piriformis and greater trochanter. + Faberstill present with tightness of the piriformis No SI joint tenderness and normal minimal SI movement. Contralateral hip fairly unremarkable    Impression and Recommendations:     This case required medical decision making of moderate complexity.

## 2014-09-25 NOTE — Assessment & Plan Note (Signed)
Patient does have areas in her back that likely her contribute to some of her pain. We discussed the possibility of intervention including epidural steroid injections which patient declined at this time. Patient feels like she is improving overall. Encourage her to do exercises on a regular basis and given her more strengthening exercises today. Patient will try these different changes and will call if any worsening pain. Otherwise patient will follow-up again in 6-8 weeks for further evaluation and treatment.

## 2014-09-25 NOTE — Patient Instructions (Signed)
Good to see you Ice is your friend Continue the exercises and alternate with your back Continue the vitamins.  In 2 month if pain returns we can repeat injection in your hip And if back gets worse call me and we can do epidural if needed.

## 2014-09-25 NOTE — Assessment & Plan Note (Signed)
Patient did respond very well to steroid injection 1 month ago. Discussed with patient to continue the icing protocol as well as a home exercises. Patient was given a trial size of topical anti-inflammatories to try. Patient encouraged to continue then vitamin supplementations. We discussed the possibility of formal physical therapy which patient declined. We discussed that there is also a possibility of some referred pain from her lumbar radiculopathy. This seems to worsen we may need to consider epidural steroid injections. Otherwise his lungs patient continues to do well she will follow-up in 6-8 weeks for further evaluation and treatment.  Spent greater than 25 minutes with patient face-to-face and had greater than 50% of counseling including as described above in assessment and plan.

## 2014-10-14 ENCOUNTER — Ambulatory Visit: Payer: Medicare Other | Admitting: Family Medicine

## 2014-10-30 ENCOUNTER — Ambulatory Visit: Payer: Medicare Other

## 2014-11-05 ENCOUNTER — Ambulatory Visit: Payer: Medicare Other

## 2014-12-04 ENCOUNTER — Encounter: Payer: Self-pay | Admitting: Internal Medicine

## 2014-12-04 ENCOUNTER — Ambulatory Visit (INDEPENDENT_AMBULATORY_CARE_PROVIDER_SITE_OTHER): Payer: 59 | Admitting: Internal Medicine

## 2014-12-04 VITALS — BP 122/80 | HR 80 | Temp 98.3°F | Ht 65.0 in | Wt 222.8 lb

## 2014-12-04 DIAGNOSIS — E039 Hypothyroidism, unspecified: Secondary | ICD-10-CM

## 2014-12-04 DIAGNOSIS — R062 Wheezing: Secondary | ICD-10-CM

## 2014-12-04 DIAGNOSIS — Z0000 Encounter for general adult medical examination without abnormal findings: Secondary | ICD-10-CM

## 2014-12-04 DIAGNOSIS — Z0189 Encounter for other specified special examinations: Secondary | ICD-10-CM

## 2014-12-04 DIAGNOSIS — J069 Acute upper respiratory infection, unspecified: Secondary | ICD-10-CM

## 2014-12-04 DIAGNOSIS — E538 Deficiency of other specified B group vitamins: Secondary | ICD-10-CM

## 2014-12-04 DIAGNOSIS — R7302 Impaired glucose tolerance (oral): Secondary | ICD-10-CM

## 2014-12-04 MED ORDER — CETIRIZINE HCL 10 MG PO TABS: 10.0000 mg | ORAL_TABLET | Freq: Every day | ORAL | Status: DC

## 2014-12-04 MED ORDER — CYANOCOBALAMIN 1000 MCG/ML IJ SOLN
1000.0000 ug | Freq: Once | INTRAMUSCULAR | Status: AC
  Administered 2014-12-04: 1000 ug via INTRAMUSCULAR

## 2014-12-04 MED ORDER — AZITHROMYCIN 250 MG PO TABS: ORAL_TABLET | ORAL | Status: DC

## 2014-12-04 MED ORDER — PREDNISONE 10 MG PO TABS: ORAL_TABLET | ORAL | Status: DC

## 2014-12-04 MED ORDER — LEVOTHYROXINE SODIUM 88 MCG PO TABS: 88.0000 ug | ORAL_TABLET | Freq: Every day | ORAL | Status: DC

## 2014-12-04 MED ORDER — TIZANIDINE HCL 4 MG PO TABS: 4.0000 mg | ORAL_TABLET | Freq: Four times a day (QID) | ORAL | Status: DC | PRN

## 2014-12-04 MED ORDER — ALENDRONATE SODIUM 70 MG PO TABS: 70.0000 mg | ORAL_TABLET | ORAL | Status: DC

## 2014-12-04 NOTE — Progress Notes (Signed)
Subjective:    Patient ID: [name], female    DOB: [birth year], 74 y.o.   MRN: [id number]  HPI  Here to f/u; overall doing ok,  Pt denies chest pain, increased sob or doe, wheezing, orthopnea, PND, increased LE swelling, palpitations, dizziness or syncope.  Pt denies polydipsia, polyuria, or low sugar symptoms such as weakness or confusion improved with po intake.  Pt denies new neurological symptoms such as new headache, or facial or extremity weakness or numbness.   Pt states overall good compliance with meds, has been trying to follow lower cholesterol, diabetic diet, with wt overall down several lbs intentionally.  Still taking the levothyroxine at 50, some confusion over the increase to 88 after last visit, so did not do this. Denies hyper or hypo thyroid symptoms such as voice, skin or hair change.   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  Here with 2-3 days acute onset fever, facial pain, pressure, headache, general weakness and malaise, and greenish d/c, with mild ST and cough, but pt denies chest pain, wheezing, increased sob or doe, orthopnea, PND, increased LE swelling, palpitations, dizziness or syncope. Past Medical History  Diagnosis Date  . Hx of colonic polyps   . Chronic headaches     Chronic pain  . Hypothyroidism   . Hyperlipidemia   . Anemia     NOS  . DJD (degenerative joint disease)     Spine, hands  . Asthma   . Hearing loss   . Sickle cell trait   . Migraine   . Peptic ulcer disease   . Allergic rhinitis   . DDD (degenerative disc disease), cervical   . CTS (carpal tunnel syndrome) 8/08    Right, severe, emg/ncs; Left, moderate, emg/ncs  . Stenosing tenosynovitis of thumb     Left  . Hypertension   . Diabetes mellitus     Type II-diet  . Vitamin D deficiency   . Peripheral neuropathy   . Disc disease, degenerative, cervical 06/07/2011  . Chronic neck pain 06/07/2011  . B12 deficiency 09/27/2011   Past Surgical History    Procedure Laterality Date  . Knee surgery    . Abdominal hysterectomy      reports that she has never smoked. She has never used smokeless tobacco. She reports that she does not drink alcohol or use illicit drugs. family history includes Arthritis in her other; Diabetes in her mother and other; Hyperlipidemia in her other. Allergies  Allergen Reactions  . Lactose Intolerance (Gi)   . Naproxen    Current Outpatient Prescriptions on File Prior to Visit  Medication Sig Dispense Refill  . albuterol (PROVENTIL HFA;VENTOLIN HFA) 108 (90 BASE) MCG/ACT inhaler Inhale 2 puffs into the lungs every 6 (six) hours as needed for wheezing. 1 Inhaler 3  . aspirin 81 MG EC tablet Take 81 mg by mouth daily.      Marland Kitchen atenolol (TENORMIN) 50 MG tablet Take 1 tablet (50 mg total) by mouth daily. 90 tablet 3  . calcium-vitamin D (OSCAL WITH D) 500-200 MG-UNIT per tablet Take 1 tablet by mouth 2 (two) times daily.     Marland Kitchen Cod Liver Oil CAPS Take 1 capsule by mouth daily.      . cyanocobalamin (,VITAMIN B-12,) 1000 MCG/ML injection Inject 1,000 mcg into the muscle every 30 (thirty) days.      . ferrous sulfate 325 (65 FE) MG tablet Take 325 mg by mouth daily.      Marland Kitchen  gabapentin (NEURONTIN) 100 MG capsule Take 1 capsule (100 mg total) by mouth 3 (three) times daily. 90 capsule 5  . levothyroxine (SYNTHROID, LEVOTHROID) 88 MCG tablet Take 1 tablet (88 mcg total) by mouth daily. 90 tablet 3  . mometasone-formoterol (DULERA) 100-5 MCG/ACT AERO Inhale 2 puffs into the lungs 2 (two) times daily. 13 g 2  . nystatin cream (MYCOSTATIN) Apply topically 2 (two) times daily. 30 g 1   No current facility-administered medications on file prior to visit.     Review of Systems  Constitutional: Negative for unusual diaphoresis or other sweats  HENT: Negative for ringing in ear Eyes: Negative for double vision or worsening visual disturbance.  Respiratory: Negative for choking and stridor.   Gastrointestinal: Negative for  vomiting or other signifcant bowel change Genitourinary: Negative for hematuria or decreased urine volume.  Musculoskeletal: Negative for other MSK pain or swelling Skin: Negative for color change and worsening wound.  Neurological: Negative for tremors and numbness other than noted  Psychiatric/Behavioral: Negative for decreased concentration or agitation other than above       Objective:   Physical Exam BP 122/80 mmHg  Pulse 80  Temp(Src) 98.3 F (36.8 C) (Oral)  Ht 5\' 5"  (1.651 m)  Wt 222 lb 12 oz (101.039 kg)  BMI 37.07 kg/m2  SpO2 95% VS noted, mild ill Constitutional: Pt appears well-developed, well-nourished.  HENT: Head: NCAT.  Right Ear: External ear normal.  Left Ear: External ear normal.  Bilat tm's with mild erythema.  Max sinus areas non tender.  Pharynx with mild erythema, no exudate Eyes: . Pupils are equal, round, and reactive to light. Conjunctivae and EOM are normal Neck: Normal range of motion. Neck supple. with bilat tender submandib LA Cardiovascular: Normal rate and regular rhythm.   Pulmonary/Chest: Effort normal and breath sounds without rales, but with mild bilat wheezing.  Neurological: Pt is alert. Not confused , motor grossly intact Skin: Skin is warm. No rash Psychiatric: Pt behavior is normal. No agitation.         Assessment & Plan:

## 2014-12-04 NOTE — Assessment & Plan Note (Signed)
Mild to mod, for pred pac asd, declines inhaler due to cost,  to f/u any worsening symptoms or concerns

## 2014-12-04 NOTE — Patient Instructions (Signed)
You had the B12 shot today  Please take all new medication as prescribed - the antibiotic, and prednisone  Please continue all other medications as before, and refills have been done if requested - the fosamax, zyrtec, and tizanidine  Please have the pharmacy call with any other refills you may need.  Please continue your efforts at being more active, low cholesterol diet, and weight control.  Please keep your appointments with your specialists as you may have planned  Please return in 6 months, or sooner if needed, with Lab testing done 3-5 days before

## 2014-12-04 NOTE — Assessment & Plan Note (Signed)
Mild to mod, for antibx course,  to f/u any worsening symptoms or concerns 

## 2014-12-04 NOTE — Progress Notes (Signed)
Pre visit review using our clinic review tool, if applicable. No additional management support is needed unless otherwise documented below in the visit note. 

## 2014-12-04 NOTE — Addendum Note (Signed)
Addended by: Corwin LevinsJOHN, [name] W on: 12/04/2014 09:51 AM   Modules accepted: Orders

## 2014-12-04 NOTE — Assessment & Plan Note (Signed)
Asympt, for IM replacement today

## 2014-12-04 NOTE — Assessment & Plan Note (Signed)
D/w pt, ok to change to the 88 mcg qd, for f/u labs next visit

## 2015-01-06 ENCOUNTER — Ambulatory Visit: Payer: 59

## 2015-01-07 ENCOUNTER — Ambulatory Visit (INDEPENDENT_AMBULATORY_CARE_PROVIDER_SITE_OTHER): Payer: 59

## 2015-01-07 DIAGNOSIS — E538 Deficiency of other specified B group vitamins: Secondary | ICD-10-CM

## 2015-01-07 MED ORDER — CYANOCOBALAMIN 1000 MCG/ML IJ SOLN
1000.0000 ug | Freq: Once | INTRAMUSCULAR | Status: AC
  Administered 2015-01-07: 1000 ug via INTRAMUSCULAR

## 2015-02-04 ENCOUNTER — Telehealth: Payer: Self-pay | Admitting: *Deleted

## 2015-02-04 NOTE — Telephone Encounter (Signed)
[facility] - Client TELEPHONE ADVICE RECORD [name] Patient Name: [name] Gender: Female DOB: Mar 05, 1941 Age: [age] Y 4 M 25 D Return Phone Number: [phone] (Primary) Address: City/State/Zip: [place] [facility] Night Physician [name], [name] Contact Type Call Caller Name [name] Caller Phone Number [phone] Relationship To Patient Self Is this call to report lab results? No Call Type General Information Initial Comment Caller states she needs a refill of Tizanidine Pharmacy # [phone] General Information Type Message Only Nurse Assessment Guidelines Guideline Title Affirmed Question Affirmed Notes Nurse Date/Time (Eastern Time) Disp. Time Lamount Cohen(Eastern Time) Disposition Final User 02/03/2015 7:05:40 AM General Information Provided Yes Chilton SiGreen, Amy After Care Instructions Given Call Event Type User Date / Time Description

## 2015-02-09 ENCOUNTER — Ambulatory Visit (INDEPENDENT_AMBULATORY_CARE_PROVIDER_SITE_OTHER): Payer: 59 | Admitting: *Deleted

## 2015-02-09 DIAGNOSIS — E538 Deficiency of other specified B group vitamins: Secondary | ICD-10-CM

## 2015-02-09 MED ORDER — TIZANIDINE HCL 4 MG PO TABS: 4.0000 mg | ORAL_TABLET | Freq: Four times a day (QID) | ORAL | Status: DC | PRN

## 2015-02-09 MED ORDER — CYANOCOBALAMIN 1000 MCG/ML IJ SOLN
1000.0000 ug | Freq: Once | INTRAMUSCULAR | Status: AC
  Administered 2015-02-09: 1000 ug via INTRAMUSCULAR

## 2015-03-12 ENCOUNTER — Encounter: Payer: Self-pay | Admitting: *Deleted

## 2015-03-12 ENCOUNTER — Ambulatory Visit (INDEPENDENT_AMBULATORY_CARE_PROVIDER_SITE_OTHER): Payer: 59 | Admitting: *Deleted

## 2015-03-12 DIAGNOSIS — E538 Deficiency of other specified B group vitamins: Secondary | ICD-10-CM

## 2015-03-12 MED ORDER — CYANOCOBALAMIN 1000 MCG/ML IJ SOLN
1000.0000 ug | Freq: Once | INTRAMUSCULAR | Status: AC
  Administered 2015-03-12: 1000 ug via INTRAMUSCULAR

## 2015-03-24 ENCOUNTER — Other Ambulatory Visit: Payer: Self-pay | Admitting: Internal Medicine

## 2015-04-10 ENCOUNTER — Ambulatory Visit (INDEPENDENT_AMBULATORY_CARE_PROVIDER_SITE_OTHER): Payer: 59 | Admitting: *Deleted

## 2015-04-10 DIAGNOSIS — E538 Deficiency of other specified B group vitamins: Secondary | ICD-10-CM

## 2015-04-10 MED ORDER — CYANOCOBALAMIN 1000 MCG/ML IJ SOLN
1000.0000 ug | Freq: Once | INTRAMUSCULAR | Status: AC
  Administered 2015-04-10: 1000 ug via INTRAMUSCULAR

## 2015-04-21 DIAGNOSIS — Z0279 Encounter for issue of other medical certificate: Secondary | ICD-10-CM

## 2015-05-13 ENCOUNTER — Ambulatory Visit: Payer: 59

## 2015-06-11 ENCOUNTER — Ambulatory Visit: Payer: 59 | Admitting: Internal Medicine

## 2015-06-11 DIAGNOSIS — Z0289 Encounter for other administrative examinations: Secondary | ICD-10-CM

## 2015-07-24 ENCOUNTER — Other Ambulatory Visit: Payer: Self-pay | Admitting: Internal Medicine

## 2015-07-25 ENCOUNTER — Other Ambulatory Visit: Payer: Self-pay | Admitting: Internal Medicine

## 2015-09-03 ENCOUNTER — Other Ambulatory Visit: Payer: Self-pay | Admitting: Internal Medicine

## 2015-09-14 ENCOUNTER — Other Ambulatory Visit: Payer: Self-pay | Admitting: Internal Medicine

## 2015-09-22 ENCOUNTER — Encounter: Payer: 59 | Admitting: Internal Medicine

## 2015-09-22 DIAGNOSIS — Z0289 Encounter for other administrative examinations: Secondary | ICD-10-CM

## 2015-10-16 ENCOUNTER — Encounter: Payer: 59 | Admitting: Internal Medicine

## 2015-11-17 DIAGNOSIS — N183 Chronic kidney disease, stage 3 (moderate): Secondary | ICD-10-CM | POA: Diagnosis not present

## 2015-11-17 DIAGNOSIS — Z1231 Encounter for screening mammogram for malignant neoplasm of breast: Secondary | ICD-10-CM | POA: Diagnosis not present

## 2015-11-17 LAB — HM MAMMOGRAPHY: HM MAMMO: NEGATIVE

## 2015-11-30 ENCOUNTER — Other Ambulatory Visit (INDEPENDENT_AMBULATORY_CARE_PROVIDER_SITE_OTHER): Payer: Medicare Other

## 2015-11-30 ENCOUNTER — Ambulatory Visit (INDEPENDENT_AMBULATORY_CARE_PROVIDER_SITE_OTHER): Payer: Medicare Other | Admitting: Internal Medicine

## 2015-11-30 ENCOUNTER — Encounter: Payer: Self-pay | Admitting: Internal Medicine

## 2015-11-30 ENCOUNTER — Telehealth: Payer: Self-pay | Admitting: Internal Medicine

## 2015-11-30 VITALS — BP 136/82 | HR 66 | Temp 97.5°F | Resp 20 | Ht 65.0 in | Wt 225.0 lb

## 2015-11-30 DIAGNOSIS — E039 Hypothyroidism, unspecified: Secondary | ICD-10-CM | POA: Diagnosis not present

## 2015-11-30 DIAGNOSIS — R7302 Impaired glucose tolerance (oral): Secondary | ICD-10-CM

## 2015-11-30 DIAGNOSIS — Z86010 Personal history of colonic polyps: Secondary | ICD-10-CM

## 2015-11-30 DIAGNOSIS — G894 Chronic pain syndrome: Secondary | ICD-10-CM | POA: Diagnosis not present

## 2015-11-30 DIAGNOSIS — I10 Essential (primary) hypertension: Secondary | ICD-10-CM

## 2015-11-30 DIAGNOSIS — Z0000 Encounter for general adult medical examination without abnormal findings: Secondary | ICD-10-CM

## 2015-11-30 DIAGNOSIS — M545 Low back pain, unspecified: Secondary | ICD-10-CM

## 2015-11-30 DIAGNOSIS — M79672 Pain in left foot: Principal | ICD-10-CM

## 2015-11-30 DIAGNOSIS — E785 Hyperlipidemia, unspecified: Secondary | ICD-10-CM

## 2015-11-30 DIAGNOSIS — M79671 Pain in right foot: Secondary | ICD-10-CM

## 2015-11-30 DIAGNOSIS — N183 Chronic kidney disease, stage 3 (moderate): Secondary | ICD-10-CM

## 2015-11-30 DIAGNOSIS — G5603 Carpal tunnel syndrome, bilateral upper limbs: Secondary | ICD-10-CM

## 2015-11-30 LAB — BASIC METABOLIC PANEL
BUN: 18 mg/dL (ref 6–23)
CALCIUM: 9.3 mg/dL (ref 8.4–10.5)
CO2: 24 meq/L (ref 19–32)
Chloride: 108 mEq/L (ref 96–112)
Creatinine, Ser: 1.42 mg/dL — ABNORMAL HIGH (ref 0.40–1.20)
GFR: 46.48 mL/min — AB (ref >60.00)
Glucose, Bld: 99 mg/dL (ref 70–99)
POTASSIUM: 4.4 meq/L (ref 3.5–5.1)
SODIUM: 143 meq/L (ref 135–145)

## 2015-11-30 LAB — CBC WITH DIFFERENTIAL/PLATELET
Basophils Absolute: 0 10*3/uL (ref 0.0–0.1)
Basophils Relative: 0.3 % (ref 0.0–3.0)
EOS PCT: 1.3 % (ref 0.0–5.0)
Eosinophils Absolute: 0.1 10*3/uL (ref 0.0–0.7)
HCT: 34.5 % — ABNORMAL LOW (ref 36.0–46.0)
Hemoglobin: 11.5 g/dL — ABNORMAL LOW (ref 12.0–15.0)
LYMPHS ABS: 1.3 10*3/uL (ref 0.7–4.0)
Lymphocytes Relative: 29.7 % (ref 12.0–46.0)
MCHC: 33.2 g/dL (ref 30.0–36.0)
MCV: 87.8 fl (ref 78.0–100.0)
MONOS PCT: 9.7 % (ref 3.0–12.0)
Monocytes Absolute: 0.4 10*3/uL (ref 0.1–1.0)
NEUTROS ABS: 2.6 10*3/uL (ref 1.4–7.7)
NEUTROS PCT: 59 % (ref 43.0–77.0)
PLATELETS: 89 10*3/uL — AB (ref 150.0–400.0)
RBC: 3.93 Mil/uL (ref 3.87–5.11)
RDW: 13.5 % (ref 11.5–15.5)
WBC: 4.4 10*3/uL (ref 4.0–10.5)

## 2015-11-30 LAB — HEPATIC FUNCTION PANEL
ALK PHOS: 56 U/L (ref 39–117)
ALT: 11 U/L (ref 0–35)
AST: 17 U/L (ref 0–37)
Albumin: 4.1 g/dL (ref 3.5–5.2)
BILIRUBIN DIRECT: 0.1 mg/dL (ref 0.0–0.3)
TOTAL PROTEIN: 8 g/dL (ref 6.0–8.3)
Total Bilirubin: 0.4 mg/dL (ref 0.2–1.2)

## 2015-11-30 LAB — URINALYSIS, ROUTINE W REFLEX MICROSCOPIC
BILIRUBIN URINE: NEGATIVE
KETONES UR: NEGATIVE
Nitrite: NEGATIVE
PH: 6 (ref 5.0–8.0)
SPECIFIC GRAVITY, URINE: 1.01 (ref 1.000–1.030)
Total Protein, Urine: NEGATIVE
URINE GLUCOSE: NEGATIVE
Urobilinogen, UA: 1 (ref 0.0–1.0)

## 2015-11-30 LAB — LIPID PANEL
Cholesterol: 167 mg/dL (ref 0–200)
HDL: 46.6 mg/dL (ref >39.00)
LDL Cholesterol: 99 mg/dL (ref 0–99)
NONHDL: 120.58
Total CHOL/HDL Ratio: 4
Triglycerides: 106 mg/dL (ref 0.0–149.0)
VLDL: 21.2 mg/dL (ref 0.0–40.0)

## 2015-11-30 LAB — HEMOGLOBIN A1C: HEMOGLOBIN A1C: 5.7 % (ref 4.6–6.5)

## 2015-11-30 LAB — T4, FREE: Free T4: 0.54 ng/dL — ABNORMAL LOW (ref 0.60–1.60)

## 2015-11-30 LAB — RHEUMATOID FACTOR: Rhuematoid fact SerPl-aCnc: <10 IU/mL (ref <=14)

## 2015-11-30 LAB — TSH: TSH: 4.43 u[IU]/mL (ref 0.35–4.50)

## 2015-11-30 LAB — SEDIMENTATION RATE: SED RATE: 48 mm/h — AB (ref 0–22)

## 2015-11-30 MED ORDER — ATENOLOL 50 MG PO TABS: 50.0000 mg | ORAL_TABLET | Freq: Every day | ORAL | Status: DC

## 2015-11-30 MED ORDER — DICLOFENAC SODIUM 1 % TD GEL: 4.0000 g | Freq: Four times a day (QID) | TRANSDERMAL | Status: DC

## 2015-11-30 MED ORDER — LEVOTHYROXINE SODIUM 88 MCG PO TABS: 88.0000 ug | ORAL_TABLET | Freq: Every day | ORAL | Status: DC

## 2015-11-30 MED ORDER — ALENDRONATE SODIUM 70 MG PO TABS: 70.0000 mg | ORAL_TABLET | ORAL | Status: DC

## 2015-11-30 NOTE — Patient Instructions (Addendum)
You had the flu shot today  Please take all new medication as prescribed - the voltaren gel for hand pain  Please continue all other medications as before, and refills have been done if requested.  Please have the pharmacy call with any other refills you may need.  Please continue your efforts at being more active, low cholesterol diet, and weight control.  You are otherwise up to date with prevention measures today.  Please keep your appointments with your specialists as you may have planned  You will be contacted regarding the referral for: colonoscopy, and Dr Katrinka BlazingSmith  Please go to the LAB in the Basement (turn left off the elevator) for the tests to be done today  /You will be contacted by phone if any changes need to be made immediately.  Otherwise, you will receive a letter about your results with an explanation, but please check with MyChart first.  Please remember to sign up for MyChart if you have not done so, as this will be important to you in the future with finding out test results, communicating by private email, and scheduling acute appointments online when needed.  Please return in 6 months, or sooner if needed

## 2015-11-30 NOTE — Telephone Encounter (Signed)
Referral done

## 2015-11-30 NOTE — Telephone Encounter (Signed)
Inform pt of th massage below. Pt does not want to take anymore medications. Pt was wondering if Dr. Jonny RuizJohn can refer her out to podiatrist. Please call pt back

## 2015-11-30 NOTE — Progress Notes (Signed)
Subjective:    Patient ID: [name], female    DOB: 06-15-1941, 75 y.o.   MRN: [id number]  HPI  Here for wellness and f/u;  Overall doing ok;  Pt denies Chest pain, worsening SOB, DOE, wheezing, orthopnea, PND, worsening LE edema, palpitations, dizziness or syncope.  Pt denies neurological change such as new headache, facial or extremity weakness.  Pt denies polydipsia, polyuria, or low sugar symptoms. Pt states overall good compliance with treatment and medications, good tolerability, and has been trying to follow appropriate diet.  Pt denies worsening depressive symptoms, suicidal ideation or panic. No fever, night sweats, wt loss, loss of appetite, or other constitutional symptoms.  Pt states good ability with ADL's, has low fall risk, home safety reviewed and adequate, no other significant changes in hearing or vision, and only occasionally active with exercise, due to ongoing bilat knee pain left > right, sees ortho.  Also has known bilat CTS , mild despite her wrist spliints and delcines hand surgury for now.  Does have significnat OA changes of hand with incresaed pain and reduce ROM, cannot make fists bilat.  Pt continues to have recurring LBP with radiation to the right buttock and leg,,no bowel or bladder change, fever, wt loss,  worsening LE pain/numbness/weakness, gait change , but has a sense of giving out, has fallen 3 times , asks for Sport med referral, has seen in the past.with some improvement.  Also asks for rheum labs to r/o RA due to mult joint isuses.  Also may need B12 replacement, has been off shots for some time, but will check B12 first today. Just had mammogram last wk, due for f/u colonscopy. Past Medical History  Diagnosis Date  . Hx of colonic polyps   . Chronic headaches     Chronic pain  . Hypothyroidism   . Hyperlipidemia   . Anemia     NOS  . DJD (degenerative joint disease)     Spine, hands  . Asthma   . Hearing loss   . Sickle cell trait (HCC)   .  Migraine   . Peptic ulcer disease   . Allergic rhinitis   . DDD (degenerative disc disease), cervical   . CTS (carpal tunnel syndrome) 8/08    Right, severe, emg/ncs; Left, moderate, emg/ncs  . Stenosing tenosynovitis of thumb     Left  . Hypertension   . Diabetes mellitus     Type II-diet  . Vitamin D deficiency   . Peripheral neuropathy (HCC)   . Disc disease, degenerative, cervical 06/07/2011  . Chronic neck pain 06/07/2011  . B12 deficiency 09/27/2011   Past Surgical History  Procedure Laterality Date  . Knee surgery    . Abdominal hysterectomy      reports that she has never smoked. She has never used smokeless tobacco. She reports that she does not drink alcohol or use illicit drugs. family history includes Arthritis in her other; Diabetes in her mother and other; Hyperlipidemia in her other. Allergies  Allergen Reactions  . Lactose Intolerance (Gi)   . Naproxen    Current Outpatient Prescriptions on File Prior to Visit  Medication Sig Dispense Refill  . alendronate (FOSAMAX) 70 MG tablet take 1 tablet by mouth every week on an empty stomach with 6-8 oz of water. No food / meds for 30 min. Remain Upright 12 tablet 1  . aspirin 81 MG EC tablet Take 81 mg by mouth daily.      . calcium-vitamin D (  OSCAL WITH D) 500-200 MG-UNIT per tablet Take 1 tablet by mouth 2 (two) times daily.     Marland Kitchen. Cod Liver Oil CAPS Take 1 capsule by mouth daily.      . ferrous sulfate 325 (65 FE) MG tablet Take 325 mg by mouth daily.      . mometasone-formoterol (DULERA) 100-5 MCG/ACT AERO Inhale 2 puffs into the lungs 2 (two) times daily. 13 g 2  . nystatin cream (MYCOSTATIN) Apply topically 2 (two) times daily. 30 g 1  . albuterol (PROVENTIL HFA;VENTOLIN HFA) 108 (90 BASE) MCG/ACT inhaler Inhale 2 puffs into the lungs every 6 (six) hours as needed for wheezing. 1 Inhaler 3   No current facility-administered medications on file prior to visit.     Review of Systems Constitutional: Negative for  increased diaphoresis, other activity, appetite or siginficant weight change other than noted HENT: Negative for worsening hearing loss, ear pain, facial swelling, mouth sores and neck stiffness.   Eyes: Negative for other worsening pain, redness or visual disturbance.  Respiratory: Negative for shortness of breath and wheezing  Cardiovascular: Negative for chest pain and palpitations.  Gastrointestinal: Negative for diarrhea, blood in stool, abdominal distention or other pain Genitourinary: Negative for hematuria, flank pain or change in urine volume.  Musculoskeletal: Negative for myalgias or other joint complaints.  Skin: Negative for color change and wound or drainage.  Neurological: Negative for syncope and numbness. other than noted Hematological: Negative for adenopathy. or other swelling Psychiatric/Behavioral: Negative for hallucinations, SI, self-injury, decreased concentration or other worsening agitation.      Objective:   Physical Exam BP 136/82 mmHg  Pulse 66  Temp(Src) 97.5 F (36.4 C) (Oral)  Resp 20  Ht 5\' 5"  (1.651 m)  Wt 225 lb (102.059 kg)  BMI 37.44 kg/m2  SpO2 95% VS noted, obese Constitutional: Pt is oriented to person, place, and time. Appears well-developed and well-nourished, in no significant distress Head: Normocephalic and atraumatic.  Right Ear: External ear normal.  Left Ear: External ear normal.  Nose: Nose normal.  Mouth/Throat: Oropharynx is clear and moist.  Eyes: Conjunctivae and EOM are normal. Pupils are equal, round, and reactive to light.  Neck: Normal range of motion. Neck supple. No JVD present. No tracheal deviation present or significant neck LA or mass Cardiovascular: Normal rate, regular rhythm, normal heart sounds and intact distal pulses.   Pulmonary/Chest: Effort normal and breath sounds without rales or wheezing  Abdominal: Soft. Bowel sounds are normal. NT. No HSM  Musculoskeletal: Normal range of motion. Exhibits no edema.    Lymphadenopathy:  Has no cervical adenopathy.  Neurological: Pt is alert and oriented to person, place, and time. Pt has normal reflexes. No cranial nerve deficit. Motor grossly intact Spine nontender midlien throughout, has right lumbar paravertebral tender Skin: Skin is warm and dry. No rash noted.  Psychiatric:  Has normal mood and affect. Behavior is normal.     Assessment & Plan:

## 2015-11-30 NOTE — Progress Notes (Signed)
Pre visit review using our clinic review tool, if applicable. No additional management support is needed unless otherwise documented below in the visit note. 

## 2015-11-30 NOTE — Telephone Encounter (Signed)
This may be similar to the nerve problems she has in her hands as well  Unfortuantely there is no specific treatment, unless we wanted to consider gabapentin or elavil for nerve discomfort, but it can be just watched for now if she does not thinks needs medication, thanks

## 2015-11-30 NOTE — Telephone Encounter (Signed)
Patient for got to notify Dr. Jonny RuizJohn that she has tingling, coldness and numbness in her feet for a long time.  States that she also has started to get pain in her big toes. Would like to know what she should do.

## 2015-12-01 ENCOUNTER — Encounter: Payer: Self-pay | Admitting: Internal Medicine

## 2015-12-01 DIAGNOSIS — M545 Low back pain, unspecified: Secondary | ICD-10-CM | POA: Insufficient documentation

## 2015-12-01 LAB — ANTI-NUCLEAR AB-TITER (ANA TITER): ANA Titer 1: 1:320 {titer} — ABNORMAL HIGH

## 2015-12-01 LAB — ANA: ANA: POSITIVE — AB

## 2015-12-01 NOTE — Assessment & Plan Note (Signed)
stable overall by history and exam, recent data reviewed with pt, and pt to continue medical treatment as before,  to f/u any worsening symptoms or concerns BP Readings from Last 3 Encounters:  11/30/15 136/82  12/04/14 122/80  09/25/14 104/62

## 2015-12-01 NOTE — Assessment & Plan Note (Signed)
Due for colonoscopy f/u, to be referred

## 2015-12-01 NOTE — Telephone Encounter (Signed)
Notified patient.

## 2015-12-01 NOTE — Assessment & Plan Note (Addendum)
Cont wrist splints qhs, consider hand surgury referral but declines at this time

## 2015-12-01 NOTE — Assessment & Plan Note (Signed)
stable overall by history and exam, recent data reviewed with pt, and pt to continue medical treatment as before,  to f/u any worsening symptoms or concerns Lab Results  Component Value Date   HGBA1C 5.7 11/30/2015

## 2015-12-01 NOTE — Assessment & Plan Note (Signed)
Also for referral Dr Smith/sport med f/u as per pt request

## 2015-12-01 NOTE — Assessment & Plan Note (Signed)
stable overall by history and exam, recent data reviewed with pt, and pt to continue medical treatment as before,  to f/u any worsening symptoms or concerns Lab Results  Component Value Date   TSH 4.43 11/30/2015

## 2015-12-01 NOTE — Assessment & Plan Note (Signed)
With numerous pain complaints, for rheum labs per pt request, also volt gel to hand prn, consider hand surgury referral for bilat CTS symptoms, ortho for left knee, Dr Smith/sport med for back pain f/u

## 2015-12-01 NOTE — Assessment & Plan Note (Signed)

## 2015-12-01 NOTE — Assessment & Plan Note (Signed)
stable overall by history and exam, recent data reviewed with pt, and pt to continue medical treatment as before,  to f/u any worsening symptoms or concerns Lab Results  Component Value Date   CREATININE 1.42* 11/30/2015

## 2015-12-02 ENCOUNTER — Encounter: Payer: Self-pay | Admitting: Internal Medicine

## 2015-12-02 ENCOUNTER — Other Ambulatory Visit: Payer: Self-pay | Admitting: Internal Medicine

## 2015-12-02 DIAGNOSIS — R768 Other specified abnormal immunological findings in serum: Secondary | ICD-10-CM

## 2015-12-02 DIAGNOSIS — M2550 Pain in unspecified joint: Secondary | ICD-10-CM

## 2015-12-02 DIAGNOSIS — R700 Elevated erythrocyte sedimentation rate: Secondary | ICD-10-CM

## 2015-12-04 DIAGNOSIS — I10 Essential (primary) hypertension: Secondary | ICD-10-CM | POA: Diagnosis not present

## 2015-12-04 DIAGNOSIS — N183 Chronic kidney disease, stage 3 (moderate): Secondary | ICD-10-CM | POA: Diagnosis not present

## 2015-12-08 ENCOUNTER — Encounter: Payer: Self-pay | Admitting: Family Medicine

## 2015-12-08 ENCOUNTER — Ambulatory Visit (INDEPENDENT_AMBULATORY_CARE_PROVIDER_SITE_OTHER): Payer: Medicare Other | Admitting: Family Medicine

## 2015-12-08 VITALS — BP 152/86 | HR 65 | Wt 227.0 lb

## 2015-12-08 DIAGNOSIS — Z111 Encounter for screening for respiratory tuberculosis: Secondary | ICD-10-CM | POA: Diagnosis not present

## 2015-12-08 DIAGNOSIS — M7061 Trochanteric bursitis, right hip: Secondary | ICD-10-CM

## 2015-12-08 DIAGNOSIS — M5416 Radiculopathy, lumbar region: Secondary | ICD-10-CM

## 2015-12-08 NOTE — Assessment & Plan Note (Signed)
May need to consider further workup with repeat x-rays. We'll discuss at follow-up. Patient has an MRI from 2015 we may consider using them for possible direction for epidural.

## 2015-12-08 NOTE — Progress Notes (Signed)
[name] [address] [place], [place] [address] Phone: [phone] Subjective:    I'm seeing this patient by the request  of:  Oliver Barre, MD   CC: Right hip pain.  [id number] [name] is a 75 y.o. female coming in with complaint of right hip pain. Patient was seen greater than one year ago. Patient had been diagnosed with more of a greater trochanteric bursitis. Had responded very well to an injection. Had been doing well. At follow-up patient stated that she was doing better. Patient did have some underlying right radicular lumbar pathology as well. Patient unfortunately had other issues with the recent death of her mother and then this caused her to have significant weight gain as well. Patient has not been taking care of herself not doing the exercises, icing, and stopped all reviewed to types of medications. Patient though is looking to start becoming more active again but is finding it difficult secondary to the pain on the lateral aspect of the hip. Seems worse than previous exam she states. Denies any numbness but states that it is radiating somewhat down her leg. Affecting daily activities and can wake her up at night.  Past Medical History  Diagnosis Date  . Hx of colonic polyps   . Chronic headaches     Chronic pain  . Hypothyroidism   . Hyperlipidemia   . Anemia     NOS  . DJD (degenerative joint disease)     Spine, hands  . Asthma   . Hearing loss   . Sickle cell trait (HCC)   . Migraine   . Peptic ulcer disease   . Allergic rhinitis   . DDD (degenerative disc disease), cervical   . CTS (carpal tunnel syndrome) 8/08    Right, severe, emg/ncs; Left, moderate, emg/ncs  . Stenosing tenosynovitis of thumb     Left  . Hypertension   . Diabetes mellitus     Type II-diet  . Vitamin D deficiency   . Peripheral neuropathy (HCC)   . Disc disease, degenerative, cervical 06/07/2011  . Chronic neck pain 06/07/2011  . B12 deficiency  09/27/2011   Past Surgical History  Procedure Laterality Date  . Knee surgery    . Abdominal hysterectomy     Social History   Social History  . Marital Status: Single    Spouse Name: N/A  . Number of Children: 3  . Years of Education: N/A   Occupational History  . Nurse     [employer]   Social History Main Topics  . Smoking status: Never Smoker   . Smokeless tobacco: Never Used  . Alcohol Use: No  . Drug Use: No  . Sexual Activity: Not on file   Other Topics Concern  . Not on file   Social History Narrative   Divorced.   Allergies  Allergen Reactions  . Lactose Intolerance (Gi)   . Naproxen        Past medical history, social, surgical and family history all reviewed in electronic medical record.   Review of Systems: No headache, visual changes, nausea, vomiting, diarrhea, constipation, dizziness, abdominal pain, skin rash, fevers, chills, night sweats, weight loss, swollen lymph nodes, body aches, joint swelling, muscle aches, chest pain, shortness of breath, mood changes.   Objective Blood pressure 152/86, pulse 65, weight 227 lb (102.967 kg), SpO2 98 %.  General: No apparent distress alert and oriented x3 mood and affect normal, dressed appropriately.  HEENT: Pupils  equal, extraocular movements intact  Respiratory: Patient's speak in full sentences and does not appear short of breath  Cardiovascular: No lower extremity edema, non tender, no erythema  Skin: Warm dry intact with no signs of infection or rash on extremities or on axial skeleton.  Abdomen: Soft nontender  Neuro: Cranial nerves II through XII are intact, neurovascularly intact in all extremities with 2+ DTRs and 2+ pulses.  Lymph: No lymphadenopathy of posterior or anterior cervical chain or axillae bilaterally.  Gait normal with good balance and coordination.  MSK:  Non tender with full range of motion and good stability and symmetric strength and tone of shoulders, elbows, wrist,  knee  and ankles bilaterally.  Hip: Right ROM IR: 25 Deg, ER: 35 Deg, Flexion: 120 Deg, Extension: 100 Deg, Abduction: 45 Deg, Adduction: 45 Deg Strength IR: 5/5, ER: 5/5, Flexion: 5/5, Extension: 5/5, Abduction: 4/5, Adduction: 5/5 Pelvic alignment unremarkable to inspection and palpation. Standing hip rotation and gait without trendelenburg sign / unsteadiness. Greater trochanter severely tender to palpation No tenderness over piriformis and greater trochanter. + Faber No SI joint tenderness and normal minimal SI movement.     Procedure: Real-time Ultrasound Guided Injection of right greater trochanteric bursitis secondary to patient's body habitus Device: GE Logiq E  Ultrasound guided injection is preferred based studies that show increased duration, increased effect, greater accuracy, decreased procedural pain, increased response rate, and decreased cost with ultrasound guided versus blind injection.  Verbal informed consent obtained.  Time-out conducted.  Noted no overlying erythema, induration, or other signs of local infection.  Skin prepped in a sterile fashion.  Local anesthesia: Topical Ethyl chloride.  With sterile technique and under real time ultrasound guidance:  Greater trochanteric area was visualized and patient's bursa was noted. A 22-gauge 3 inch needle was inserted and 4 cc of 0.5% Marcaine and 1 cc of Kenalog 40 mg/dL was injected. Pictures taken Completed without difficulty  Pain immediately resolved suggesting accurate placement of the medication.  Advised to call if fevers/chills, erythema, induration, drainage, or persistent bleeding.  Images permanently stored and available for review in the ultrasound unit.  Impression: Technically successful ultrasound guided injection.      Impression and Recommendations:     This case required medical decision making of moderate complexity.

## 2015-12-08 NOTE — Patient Instructions (Signed)
Good to see you  pennsaid pinkie amount topically 2 times daily as needed.  Ice 20 minutes 2 times daily. Usually after activity and before bed. Be careful with those babies and just be honest with yourself and staff if you need help Injected side of the hip again today See me again in 3-4 weeks to make sure you are doing well. And if in pain we will look at your back.

## 2015-12-08 NOTE — Assessment & Plan Note (Signed)
Patient was given another injection today and tolerated the procedure well. We discussed icing regimen and home exercises. We discussed which activities to do an which was potentially avoid. Patient is also chronic of formal physical therapy. We discussed with patient about topical anti-inflammatories and given trial. Depending on how patient responds we may need to look at more of the lumbar radiculopathy that can also be contribute in. Patient may need some further workup if no significant improvement.

## 2015-12-10 LAB — TB SKIN TEST
INDURATION: 0 mm
TB SKIN TEST: NEGATIVE

## 2015-12-15 ENCOUNTER — Ambulatory Visit (INDEPENDENT_AMBULATORY_CARE_PROVIDER_SITE_OTHER): Payer: Medicare Other | Admitting: Podiatry

## 2015-12-15 ENCOUNTER — Encounter: Payer: Self-pay | Admitting: Podiatry

## 2015-12-15 VITALS — BP 133/79 | HR 64 | Temp 97.3°F | Resp 16

## 2015-12-15 DIAGNOSIS — G609 Hereditary and idiopathic neuropathy, unspecified: Secondary | ICD-10-CM

## 2015-12-15 NOTE — Progress Notes (Signed)
   Subjective:    Patient ID: [name], female    DOB: 21-Sep-1941, 75 y.o.   MRN: [id number]  HPI   's patient presents today with a 10 year history of numbness and tingling in her feet most noticeable at night with increasing frequency and symptoms in the past 2 years. Patient denies any specific treatment for these problems. Patient has a history of neuropathy of undetermined origin. Rest and elevation seems to reduce some of the symptoms. More recently patient is having some difficulty falling and staying asleep because of these painful feelings in her feet. Patient states that she is a type II diabetic diet control for approximately 8 years  She denies any history of foot ulceration, claudication or amputation  Review of Systems  All other systems reviewed and are negative.      Objective:   Physical Exam  Redictated 3  Vascular: DP and PT pulses 2/4 bilaterally Capillary reflex immediate bilaterally  Neurological: Sensation to 10 g monofilament wire intact 4/4 bilaterally Vibratory sensation reactive bilaterally Ankle reflex equal and reactive bilaterally  Dermatological: No skin lesions noted bilaterally Texture and turgor appears within normal limits bilaterally  Musculoskeletal: HAV deformity left Hammertoe second left Manual motor testing Hip abduction hip abduction 5/5 bilaterally Knee flexion, knee extension 5/5 bilaterally Foot plantar flexion, dorsi flexion, inversion, eversion 5/5 bilaterally       Assessment & Plan:   Assessment: Symptoms seem to be consistent with peripheral neuropathy Protective sensation intact bilaterally Motor strength appears to be adequate  Plan: Today I reviewed the results of examination with patient today and made her aware that her symptoms seem to be most consistent with peripheral neuropathy possibly associated with her prediabetes. Also, patient has a history of B12 deficiency which also could be a source of  neuropathy. At this time I made patient aware that thought her symptoms were consistent with peripheral neuropathy and I recommended she consult with her internist for further evaluation for these symptoms

## 2015-12-15 NOTE — Patient Instructions (Signed)
Discuss further evaluation and treatment for peripheral neuropathy with your primary care physician   Peripheral Neuropathy Peripheral neuropathy is a type of nerve damage. It affects nerves that carry signals between the spinal cord and other parts of the body. These are called peripheral nerves. With peripheral neuropathy, one nerve or a group of nerves may be damaged.  CAUSES  Many things can damage peripheral nerves. For some people with peripheral neuropathy, the cause is unknown. Some causes include:  Diabetes. This is the most common cause of peripheral neuropathy.  Injury to a nerve.  Pressure or stress on a nerve that lasts a long time.  Too little vitamin B. Alcoholism can lead to this.  Infections.  Autoimmune diseases, such as multiple sclerosis and systemic lupus erythematosus.  Inherited nerve diseases.  Some medicines, such as cancer drugs.  Toxic substances, such as lead and mercury.  Too little blood flowing to the legs.  Kidney disease.  Thyroid disease. SIGNS AND SYMPTOMS  Different people have different symptoms. The symptoms you have will depend on which of your nerves is damaged. Common symptoms include:  Loss of feeling (numbness) in the feet and hands.  Tingling in the feet and hands.  Pain that burns.  Very sensitive skin.  Weakness.  Not being able to move a part of the body (paralysis).  Muscle twitching.  Clumsiness or poor coordination.  Loss of balance.  Not being able to control your bladder.  Feeling dizzy.  Sexual problems. DIAGNOSIS  Peripheral neuropathy is a symptom, not a disease. Finding the cause of peripheral neuropathy can be hard. To figure that out, your health care provider will take a medical history and do a physical exam. A neurological exam will also be done. This involves checking things affected by your brain, spinal cord, and nerves (nervous system). For example, your health care provider will check your  reflexes, how you move, and what you can feel.  Other types of tests may also be ordered, such as:  Blood tests.  A test of the fluid in your spinal cord.  Imaging tests, such as CT scans or an MRI.  Electromyography (EMG). This test checks the nerves that control muscles.  Nerve conduction velocity tests. These tests check how fast messages pass through your nerves.  Nerve biopsy. A small piece of nerve is removed. It is then checked under a microscope. TREATMENT   Medicine is often used to treat peripheral neuropathy. Medicines may include:  Pain-relieving medicines. Prescription or over-the-counter medicine may be suggested.  Antiseizure medicine. This may be used for pain.  Antidepressants. These also may help ease pain from neuropathy.  Lidocaine. This is a numbing medicine. You might wear a patch or be given a shot.  Mexiletine. This medicine is typically used to help control irregular heart rhythms.  Surgery. Surgery may be needed to relieve pressure on a nerve or to destroy a nerve that is causing pain.  Physical therapy to help movement.  Assistive devices to help movement. HOME CARE INSTRUCTIONS   Only take over-the-counter or prescription medicines as directed by your health care provider. Follow the instructions carefully for any given medicines. Do not take any other medicines without first getting approval from your health care provider.  If you have diabetes, work closely with your health care provider to keep your blood sugar under control.  If you have numbness in your feet:  Check every day for signs of injury or infection. Watch for redness, warmth, and swelling.  Wear padded socks and comfortable shoes. These help protect your feet.  Do not do things that put pressure on your damaged nerve.  Do not smoke. Smoking keeps blood from getting to damaged nerves.  Avoid or limit alcohol. Too much alcohol can cause a lack of B vitamins. These vitamins are  needed for healthy nerves.  Develop a good support system. Coping with peripheral neuropathy can be stressful. Talk to a mental health specialist or join a support group if you are struggling.  Follow up with your health care provider as directed. SEEK MEDICAL CARE IF:   You have new signs or symptoms of peripheral neuropathy.  You are struggling emotionally from dealing with peripheral neuropathy.  You have a fever. SEEK IMMEDIATE MEDICAL CARE IF:   You have an injury or infection that is not healing.  You feel very dizzy or begin vomiting.  You have chest pain.  You have trouble breathing.   This information is not intended to replace advice given to you by your health care provider. Make sure you discuss any questions you have with your health care provider.   Document Released: 10/21/2002 Document Revised: 07/13/2011 Document Reviewed: 07/08/2013 Elsevier Interactive Patient Education [employer]

## 2015-12-31 DIAGNOSIS — R760 Raised antibody titer: Secondary | ICD-10-CM | POA: Diagnosis not present

## 2015-12-31 DIAGNOSIS — M19042 Primary osteoarthritis, left hand: Secondary | ICD-10-CM | POA: Diagnosis not present

## 2015-12-31 DIAGNOSIS — M19041 Primary osteoarthritis, right hand: Secondary | ICD-10-CM | POA: Diagnosis not present

## 2015-12-31 DIAGNOSIS — R700 Elevated erythrocyte sedimentation rate: Secondary | ICD-10-CM | POA: Diagnosis not present

## 2015-12-31 DIAGNOSIS — D649 Anemia, unspecified: Secondary | ICD-10-CM | POA: Diagnosis not present

## 2015-12-31 DIAGNOSIS — D696 Thrombocytopenia, unspecified: Secondary | ICD-10-CM | POA: Diagnosis not present

## 2016-01-04 ENCOUNTER — Ambulatory Visit (INDEPENDENT_AMBULATORY_CARE_PROVIDER_SITE_OTHER): Payer: Medicare Other | Admitting: Family Medicine

## 2016-01-04 ENCOUNTER — Encounter: Payer: Self-pay | Admitting: Family Medicine

## 2016-01-04 VITALS — BP 138/76 | HR 54 | Ht 65.0 in | Wt 227.0 lb

## 2016-01-04 DIAGNOSIS — M5416 Radiculopathy, lumbar region: Secondary | ICD-10-CM | POA: Insufficient documentation

## 2016-01-04 MED ORDER — GABAPENTIN 100 MG PO CAPS: 100.0000 mg | ORAL_CAPSULE | Freq: Every day | ORAL | Status: DC

## 2016-01-04 NOTE — Progress Notes (Signed)
Pre visit review using our clinic review tool, if applicable. No additional management support is needed unless otherwise documented below in the visit note. 

## 2016-01-04 NOTE — Assessment & Plan Note (Signed)
Patient is having more of a lumbar radiculopathy. Patient has had an MRI showing an L3 nerve root impingement previously. Patient does have some mild spinal stenosis at multiple levels and facet arthropathy. I do think that she could respond to an epidural steroid injection that was ordered today. Patient put on a very low dose of gabapentin. We do not want increasing Derry to her chronic kidney disease. We discussed icing regimen. Patient will be sent to formal physical therapy. Patient will come back and see me again in 2 weeks after the epidural to tell me how she is doing.  Spent  25 minutes with patient face-to-face and had greater than 50% of counseling including as described above in assessment and plan.

## 2016-01-04 NOTE — Progress Notes (Signed)
[name] [address] [place], [place] [address] Phone: [phone] Subjective:    I'm seeing this patient by the request  of:  Oliver Barre, MD   CC: Right hip pain. F/u   [name] is a 75 y.o. female coming in with complaint of right hip pain. Patient was seen previously and did a greater trochanteric bursitis. Patient was given an injection and states that the pain is most completely resolved. Continues to have unfortunately of back pain. Patient states that it does seem to radiate down to the lateral aspect of her head. Seems different than the greater trochanteric bursitis. Reviewing patient's chart in great detail patient has had a MRI back in 2015. This was independently visualized by me. Patient did have impingement on the L3 nerve root on the left side. Patient states that she has had this recurrent problem for quite some time and seems to be worsening. 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 Patient is having difficulty staying in a flexed position secondary to the pain. No weakness or any giving out of the leg but does have radicular symptoms.  Past Medical History  Diagnosis Date  . Hx of colonic polyps   . Chronic headaches     Chronic pain  . Hypothyroidism   . Hyperlipidemia   . Anemia     NOS  . DJD (degenerative joint disease)     Spine, hands  . Asthma   . Hearing loss   . Sickle cell trait (HCC)   . Migraine   . Peptic ulcer disease   . Allergic rhinitis   . DDD (degenerative disc disease), cervical   . CTS (carpal tunnel syndrome) 8/08    Right, severe, emg/ncs; Left, moderate, emg/ncs  . Stenosing tenosynovitis of thumb     Left  . Hypertension   . Diabetes mellitus     Type II-diet  . Vitamin D deficiency   . Peripheral neuropathy (HCC)   . Disc disease, degenerative, cervical 06/07/2011  . Chronic neck pain 06/07/2011  . B12 deficiency 09/27/2011   Past  Surgical History  Procedure Laterality Date  . Knee surgery    . Abdominal hysterectomy     Social History   Social History  . Marital Status: Single    Spouse Name: N/A  . Number of Children: 3  . Years of Education: N/A   Occupational History  . Nurse     [employer]   Social History Main Topics  . Smoking status: Never Smoker   . Smokeless tobacco: Never Used  . Alcohol Use: No  . Drug Use: No  . Sexual Activity: Not on file   Other Topics Concern  . Not on file   Social History Narrative   Divorced.   Allergies  Allergen Reactions  . Lactose Intolerance (Gi)   . Naproxen        Past medical history, social, surgical and family history all reviewed in electronic medical record.   Review of Systems: No headache, visual changes, nausea, vomiting, diarrhea, constipation, dizziness, abdominal pain, skin rash, fevers, chills, night sweats, weight loss, swollen lymph nodes, body aches, joint swelling, muscle aches, chest pain, shortness of breath, mood changes.   Objective Blood pressure 138/76, pulse 54, height  (1.651 m), weight 227 lb (102.967 kg), SpO2 97 %.  General: No apparent distress alert and oriented x3 mood and affect normal, dressed appropriately.  HEENT: Pupils equal, extraocular movements intact  Respiratory: Patient's speak in full sentences and does not appear short of breath  Cardiovascular: No lower extremity edema, non tender, no erythema  Skin: Warm dry intact with no signs of infection or rash on extremities or on axial skeleton.  Abdomen: Soft nontender  Neuro: Cranial nerves II through XII are intact, neurovascularly intact in all extremities with 2+ DTRs and 2+ pulses.  Lymph: No lymphadenopathy of posterior or anterior cervical chain or axillae bilaterally.  Gait normal with good balance and coordination.  MSK:  Non tender with full range of motion and good stability and symmetric strength and tone of shoulders, elbows, wrist,   knee and ankles bilaterally.  Hip: Right ROM IR: 25 Deg, ER: 35 Deg, Flexion: 120 Deg, Extension: 100 Deg, Abduction: 45 Deg, Adduction: 45 Deg Strength IR: 5/5, ER: 5/5, Flexion: 5/5, Extension: 5/5, Abduction: 4/5, Adduction: 5/5 Pelvic alignment unremarkable to inspection and palpation. Standing hip rotation and gait without trendelenburg sign / unsteadiness. Nontender on exam today over the greater trochanter. No tenderness over piriformis and greater trochanter. + Pearlean Brownie patient does have a positive straight leg test on the left side. No SI joint tenderness and normal minimal SI movement. Patient is mainly tender over the paraspinal musculature on the left side mostly over L3-L4.        Impression and Recommendations:     This case required medical decision making of moderate complexity.

## 2016-01-04 NOTE — Patient Instructions (Signed)
Good to see you  [name] is your friend Physical therapy [phone] We will get an epidural in your back that I think will be good.  Gabapentin  at night can help sleep and the pain.  We will not increase it due to the kidneys See me again in 2 weeks after the epidural.

## 2016-01-05 ENCOUNTER — Ambulatory Visit: Payer: Medicare Other | Admitting: Family Medicine

## 2016-01-15 DIAGNOSIS — D696 Thrombocytopenia, unspecified: Secondary | ICD-10-CM | POA: Diagnosis not present

## 2016-01-15 DIAGNOSIS — R760 Raised antibody titer: Secondary | ICD-10-CM | POA: Diagnosis not present

## 2016-01-15 DIAGNOSIS — D649 Anemia, unspecified: Secondary | ICD-10-CM | POA: Diagnosis not present

## 2016-01-15 DIAGNOSIS — R700 Elevated erythrocyte sedimentation rate: Secondary | ICD-10-CM | POA: Diagnosis not present

## 2016-01-15 DIAGNOSIS — M19042 Primary osteoarthritis, left hand: Secondary | ICD-10-CM | POA: Diagnosis not present

## 2016-01-26 ENCOUNTER — Other Ambulatory Visit: Payer: Medicare Other

## 2016-01-28 ENCOUNTER — Other Ambulatory Visit: Payer: Medicare Other

## 2016-01-28 ENCOUNTER — Ambulatory Visit
Admission: RE | Admit: 2016-01-28 | Discharge: 2016-01-28 | Disposition: A | Discharge status: Home / Self Care | Payer: Medicare Other | Source: Ambulatory Visit | Attending: Family Medicine | Admitting: Family Medicine

## 2016-01-28 DIAGNOSIS — M5416 Radiculopathy, lumbar region: Secondary | ICD-10-CM | POA: Diagnosis not present

## 2016-01-28 MED ORDER — IOHEXOL 180 MG/ML  SOLN
1.0000 mL | Freq: Once | INTRAMUSCULAR | Status: AC | PRN
Start: 2016-01-28 — End: 2016-01-28
  Administered 2016-01-28: 1 mL via EPIDURAL

## 2016-01-28 MED ORDER — METHYLPREDNISOLONE ACETATE 40 MG/ML INJ SUSP (RADIOLOG
120.0000 mg | Freq: Once | INTRAMUSCULAR | Status: AC
Start: 2016-01-28 — End: 2016-01-28
  Administered 2016-01-28: 120 mg via EPIDURAL

## 2016-01-28 NOTE — Discharge Instructions (Signed)

## 2016-01-29 ENCOUNTER — Telehealth: Payer: Self-pay | Admitting: *Deleted

## 2016-01-29 NOTE — Telephone Encounter (Signed)
Pt left vmail stating she had an epidural done yesterday. 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 She has also requested a f/u appt with dr Katrinka Blazingsmith next week. Called pt back, unable to leave a vmail.

## 2016-01-30 NOTE — Telephone Encounter (Signed)
Fine with letter.  Want to see her 2 weeks after epidural, can take some time to work.  Thank you

## 2016-02-01 ENCOUNTER — Encounter: Payer: Self-pay | Admitting: *Deleted

## 2016-02-01 DIAGNOSIS — M545 Low back pain: Secondary | ICD-10-CM | POA: Diagnosis not present

## 2016-02-01 DIAGNOSIS — M2550 Pain in unspecified joint: Secondary | ICD-10-CM | POA: Diagnosis not present

## 2016-02-01 DIAGNOSIS — R760 Raised antibody titer: Secondary | ICD-10-CM | POA: Diagnosis not present

## 2016-02-01 DIAGNOSIS — M7531 Calcific tendinitis of right shoulder: Secondary | ICD-10-CM | POA: Diagnosis not present

## 2016-02-01 NOTE — Telephone Encounter (Signed)
Pt called back. I scheduled her for her epidural f/u. Can you please give her a call back regarding the letter

## 2016-02-01 NOTE — Telephone Encounter (Signed)
Spoke with pt, letter left at front desk for pt to come by & pick up. -

## 2016-02-11 ENCOUNTER — Encounter: Payer: Self-pay | Admitting: Family Medicine

## 2016-02-11 ENCOUNTER — Encounter: Payer: Self-pay | Admitting: *Deleted

## 2016-02-11 ENCOUNTER — Other Ambulatory Visit: Payer: Self-pay | Admitting: *Deleted

## 2016-02-11 ENCOUNTER — Ambulatory Visit (INDEPENDENT_AMBULATORY_CARE_PROVIDER_SITE_OTHER): Payer: Medicare Other | Admitting: Family Medicine

## 2016-02-11 VITALS — BP 140/86 | HR 61 | Ht 65.0 in | Wt 227.0 lb

## 2016-02-11 DIAGNOSIS — M5416 Radiculopathy, lumbar region: Secondary | ICD-10-CM

## 2016-02-11 NOTE — Progress Notes (Signed)
Pre visit review using our clinic review tool, if applicable. No additional management support is needed unless otherwise documented below in the visit note. 

## 2016-02-11 NOTE — Assessment & Plan Note (Signed)
Patient overall has made some improvement. Continues to have some of the radicular symptoms. Patient does have some tightness overall. I do think the patient continues to have some radicular symptoms but does seem better than previous exam. Patient is continuing to be very active for her age. We will put her on some limitation at work that seems to exacerbate the situation. Patient is looking for a potential other avenues of employment to help decrease these type of changes. Icing regimen encourage. No change in medications. Follow-up again in 6 weeks.  Spent  25 minutes with patient face-to-face and had greater than 50% of counseling including as described above in assessment and plan.

## 2016-02-11 NOTE — Patient Instructions (Addendum)
Great to see you  [name] is your friend We will repeat epidural more on the left side this time pennsaid pinkie amount topically 2 times daily as needed. Use on the knee and the back  We will try some limitations at work  See me again in 6 weeks.

## 2016-02-11 NOTE — Progress Notes (Signed)
[facility]  [facility] [address] [place], [place] [address] Phone: [phone] Subjective:    I'm seeing this patient by the request  of:  Sherri BarreJames John, MD   CC: Right hip pain. F/u   OZH:[id number]:Subjective [name] is a 75 y.o. female coming in with complaint of right hip pain. Patient was seen previously and did a greater trochanteric bursitis. Patient was given an injection and states that the pain is most completely resolved. Continues to have unfortunately of back pain. Patient states that it does seem to radiate down to the lateral aspect of her hip Patient was having significant pain and fell out of the conservative therapy. Patient was trying to take gabapentin. Patient patient's 2015 MRI we did do an epidural steroid injection at L4-L5. This is patient's first. This happen on 01/28/2016. Patient states she does feel approximately 40% better. States that the right knee pain is better as well. Denies any numbness or tingling. Rates the severity of pain now as 4 out of 10. 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 Patient states that some of her daily activities has gotten better since the epidural steroid injection. Continues to gabapentin at a low dose at night. Hasn't tried titrating up but had difficulty.  Past Medical History  Diagnosis Date  . Hx of colonic polyps   . Chronic headaches     Chronic pain  . Hypothyroidism   . Hyperlipidemia   . Anemia     NOS  . DJD (degenerative joint disease)     Spine, hands  . Asthma   . Hearing loss   . Sickle cell trait (HCC)   . Migraine   . Peptic ulcer disease   . Allergic rhinitis   . DDD (degenerative disc disease), cervical   . CTS (carpal tunnel syndrome) 8/08    Right, severe, emg/ncs; Left, moderate, emg/ncs  . Stenosing tenosynovitis of thumb     Left  . Hypertension   .  Diabetes mellitus     Type II-diet  . Vitamin D deficiency   . Peripheral neuropathy (HCC)   . Disc disease, degenerative, cervical 06/07/2011  . Chronic neck pain 06/07/2011  . B12 deficiency 09/27/2011   Past Surgical History  Procedure Laterality Date  . Knee surgery    . Abdominal hysterectomy     Social History   Social History  . Marital Status: Single    Spouse Name: N/A  . Number of Children: 3  . Years of Education: N/A   Occupational History  . Nurse     [employer]   Social History Main Topics  . Smoking status: Never Smoker   . Smokeless tobacco: Never Used  . Alcohol Use: No  . Drug Use: No  . Sexual Activity: Not on file   Other Topics Concern  . Not on file   Social History Narrative   Divorced.   Allergies  Allergen Reactions  . Naproxen Itching and Swelling    Throat gets itchy and swells  . Lactose Intolerance (Gi)        Past medical history, social, surgical and family history all reviewed in electronic medical record.   Review of Systems: No headache, visual changes, nausea, vomiting, diarrhea, constipation, dizziness, abdominal pain, skin rash, fevers, chills, night sweats, weight loss, swollen lymph nodes, body aches,  joint swelling, muscle aches, chest pain, shortness of breath, mood changes.   Objective Blood pressure 140/86, pulse 61, height  (1.651 m), weight 227 lb (102.967 kg), SpO2 98 %.  General: No apparent distress alert and oriented x3 mood and affect normal, dressed appropriately.  HEENT: Pupils equal, extraocular movements intact  Respiratory: Patient's speak in full sentences and does not appear short of breath  Cardiovascular: No lower extremity edema, non tender, no erythema  Skin: Warm dry intact with no signs of infection or rash on extremities or on axial skeleton.  Abdomen: Soft nontender  Neuro: Cranial nerves II through XII are intact, neurovascularly intact in all extremities with 2+ DTRs and 2+ pulses.   Lymph: No lymphadenopathy of posterior or anterior cervical chain or axillae bilaterally.  Gait normal with good balance and coordination.  MSK:  Non tender with full range of motion and good stability and symmetric strength and tone of shoulders, elbows, wrist,  knee and ankles bilaterally.  Hip: Right ROM IR: 25 Deg, ER: 35 Deg, Flexion: 120 Deg, Extension: 100 Deg, Abduction: 45 Deg, Adduction: 45 Deg Strength IR: 5/5, ER: 5/5, Flexion: 5/5, Extension: 5/5, Abduction: 4/5, Adduction: 5/5 Pelvic alignment unremarkable to inspection and palpation. Standing hip rotation and gait without trendelenburg sign / unsteadiness. Nontender on exam today over the greater trochanter. No tenderness over piriformis and greater trochanter. + Pearlean Brownie patient does have a positive straight leg test on the left side. No SI joint tenderness and normal minimal SI movement. Still tenderness over the per the musculature of the lumbar spine mostly on the right side.        Impression and Recommendations:     This case required medical decision making of moderate complexity.

## 2016-03-18 ENCOUNTER — Encounter (HOSPITAL_COMMUNITY): Payer: Self-pay

## 2016-03-18 ENCOUNTER — Emergency Department (HOSPITAL_COMMUNITY): Payer: Medicare Other

## 2016-03-18 ENCOUNTER — Observation Stay (HOSPITAL_COMMUNITY)
Admission: EM | Admit: 2016-03-18 | Discharge: 2016-03-21 | Disposition: A | Discharge status: Home / Self Care | Payer: Medicare Other | Attending: Internal Medicine | Admitting: Internal Medicine

## 2016-03-18 DIAGNOSIS — Z6838 Body mass index (BMI) 38.0-38.9, adult: Secondary | ICD-10-CM | POA: Insufficient documentation

## 2016-03-18 DIAGNOSIS — R079 Chest pain, unspecified: Secondary | ICD-10-CM | POA: Diagnosis not present

## 2016-03-18 DIAGNOSIS — E559 Vitamin D deficiency, unspecified: Secondary | ICD-10-CM | POA: Insufficient documentation

## 2016-03-18 DIAGNOSIS — M1990 Unspecified osteoarthritis, unspecified site: Secondary | ICD-10-CM | POA: Diagnosis not present

## 2016-03-18 DIAGNOSIS — J189 Pneumonia, unspecified organism: Secondary | ICD-10-CM | POA: Diagnosis not present

## 2016-03-18 DIAGNOSIS — N179 Acute kidney failure, unspecified: Secondary | ICD-10-CM | POA: Insufficient documentation

## 2016-03-18 DIAGNOSIS — Z79899 Other long term (current) drug therapy: Secondary | ICD-10-CM | POA: Diagnosis not present

## 2016-03-18 DIAGNOSIS — E1140 Type 2 diabetes mellitus with diabetic neuropathy, unspecified: Secondary | ICD-10-CM | POA: Diagnosis not present

## 2016-03-18 DIAGNOSIS — R51 Headache: Secondary | ICD-10-CM

## 2016-03-18 DIAGNOSIS — E039 Hypothyroidism, unspecified: Secondary | ICD-10-CM | POA: Diagnosis present

## 2016-03-18 DIAGNOSIS — Z7983 Long term (current) use of bisphosphonates: Secondary | ICD-10-CM | POA: Diagnosis not present

## 2016-03-18 DIAGNOSIS — J9601 Acute respiratory failure with hypoxia: Secondary | ICD-10-CM | POA: Diagnosis not present

## 2016-03-18 DIAGNOSIS — E1122 Type 2 diabetes mellitus with diabetic chronic kidney disease: Secondary | ICD-10-CM | POA: Insufficient documentation

## 2016-03-18 DIAGNOSIS — G43909 Migraine, unspecified, not intractable, without status migrainosus: Secondary | ICD-10-CM | POA: Diagnosis not present

## 2016-03-18 DIAGNOSIS — I129 Hypertensive chronic kidney disease with stage 1 through stage 4 chronic kidney disease, or unspecified chronic kidney disease: Secondary | ICD-10-CM | POA: Diagnosis not present

## 2016-03-18 DIAGNOSIS — R05 Cough: Secondary | ICD-10-CM | POA: Diagnosis not present

## 2016-03-18 DIAGNOSIS — D638 Anemia in other chronic diseases classified elsewhere: Secondary | ICD-10-CM | POA: Diagnosis not present

## 2016-03-18 DIAGNOSIS — Z7951 Long term (current) use of inhaled steroids: Secondary | ICD-10-CM | POA: Insufficient documentation

## 2016-03-18 DIAGNOSIS — I10 Essential (primary) hypertension: Secondary | ICD-10-CM | POA: Diagnosis present

## 2016-03-18 DIAGNOSIS — N183 Chronic kidney disease, stage 3 (moderate): Secondary | ICD-10-CM | POA: Insufficient documentation

## 2016-03-18 DIAGNOSIS — J45909 Unspecified asthma, uncomplicated: Secondary | ICD-10-CM | POA: Diagnosis not present

## 2016-03-18 DIAGNOSIS — D696 Thrombocytopenia, unspecified: Secondary | ICD-10-CM | POA: Diagnosis present

## 2016-03-18 DIAGNOSIS — R0602 Shortness of breath: Secondary | ICD-10-CM | POA: Diagnosis present

## 2016-03-18 DIAGNOSIS — D573 Sickle-cell trait: Secondary | ICD-10-CM | POA: Insufficient documentation

## 2016-03-18 DIAGNOSIS — Z7982 Long term (current) use of aspirin: Secondary | ICD-10-CM | POA: Diagnosis not present

## 2016-03-18 DIAGNOSIS — N189 Chronic kidney disease, unspecified: Secondary | ICD-10-CM | POA: Diagnosis present

## 2016-03-18 DIAGNOSIS — E669 Obesity, unspecified: Secondary | ICD-10-CM | POA: Insufficient documentation

## 2016-03-18 DIAGNOSIS — R7302 Impaired glucose tolerance (oral): Secondary | ICD-10-CM | POA: Diagnosis present

## 2016-03-18 DIAGNOSIS — R519 Headache, unspecified: Secondary | ICD-10-CM

## 2016-03-18 HISTORY — DX: Chronic kidney disease, stage 3 unspecified: N18.30

## 2016-03-18 HISTORY — DX: Chronic kidney disease, stage 3 (moderate): N18.3

## 2016-03-18 HISTORY — DX: Unspecified osteoarthritis, unspecified site: M19.90

## 2016-03-18 HISTORY — DX: Pneumonia, unspecified organism: J18.9

## 2016-03-18 HISTORY — DX: Type 2 diabetes mellitus without complications: E11.9

## 2016-03-18 HISTORY — DX: Personal history of other medical treatment: Z92.89

## 2016-03-18 LAB — CBC WITH DIFFERENTIAL/PLATELET
BASOS PCT: 0 %
Basophils Absolute: 0 10*3/uL (ref 0.0–0.1)
EOS ABS: 0 10*3/uL (ref 0.0–0.7)
EOS PCT: 0 %
HCT: 31.7 % — ABNORMAL LOW (ref 36.0–46.0)
HEMOGLOBIN: 10.4 g/dL — AB (ref 12.0–15.0)
LYMPHS PCT: 10 %
Lymphs Abs: 1.1 10*3/uL (ref 0.7–4.0)
MCH: 29.1 pg (ref 26.0–34.0)
MCHC: 32.8 g/dL (ref 30.0–36.0)
MCV: 88.8 fL (ref 78.0–100.0)
Monocytes Absolute: 1.1 10*3/uL — ABNORMAL HIGH (ref 0.1–1.0)
Monocytes Relative: 10 %
NEUTROS ABS: 8.5 10*3/uL — AB (ref 1.7–7.7)
Neutrophils Relative %: 80 %
Platelets: 104 10*3/uL — ABNORMAL LOW (ref 150–400)
RBC: 3.57 MIL/uL — ABNORMAL LOW (ref 3.87–5.11)
RDW: 13.4 % (ref 11.5–15.5)
WBC: 10.7 10*3/uL — ABNORMAL HIGH (ref 4.0–10.5)

## 2016-03-18 LAB — CREATININE, SERUM
Creatinine, Ser: 1.54 mg/dL — ABNORMAL HIGH (ref 0.44–1.00)
GFR calc Af Amer: 37 mL/min — ABNORMAL LOW (ref >60)
GFR, EST NON AFRICAN AMERICAN: 32 mL/min — AB (ref >60)

## 2016-03-18 LAB — CBC
HCT: 30.5 % — ABNORMAL LOW (ref 36.0–46.0)
Hemoglobin: 10 g/dL — ABNORMAL LOW (ref 12.0–15.0)
MCH: 28.7 pg (ref 26.0–34.0)
MCHC: 32.8 g/dL (ref 30.0–36.0)
MCV: 87.6 fL (ref 78.0–100.0)
PLATELETS: 109 10*3/uL — AB (ref 150–400)
RBC: 3.48 MIL/uL — ABNORMAL LOW (ref 3.87–5.11)
RDW: 13.3 % (ref 11.5–15.5)
WBC: 9.5 10*3/uL (ref 4.0–10.5)

## 2016-03-18 LAB — BASIC METABOLIC PANEL
Anion gap: 12 (ref 5–15)
BUN: 14 mg/dL (ref 6–20)
CHLORIDE: 106 mmol/L (ref 101–111)
CO2: 24 mmol/L (ref 22–32)
CREATININE: 1.5 mg/dL — AB (ref 0.44–1.00)
Calcium: 9.3 mg/dL (ref 8.9–10.3)
GFR calc Af Amer: 38 mL/min — ABNORMAL LOW (ref >60)
GFR calc non Af Amer: 33 mL/min — ABNORMAL LOW (ref >60)
GLUCOSE: 144 mg/dL — AB (ref 65–99)
POTASSIUM: 4 mmol/L (ref 3.5–5.1)
SODIUM: 142 mmol/L (ref 135–145)

## 2016-03-18 LAB — GLUCOSE, CAPILLARY
GLUCOSE-CAPILLARY: 184 mg/dL — AB (ref 65–99)
Glucose-Capillary: 182 mg/dL — ABNORMAL HIGH (ref 65–99)

## 2016-03-18 MED ORDER — AMOXICILLIN 500 MG PO CAPS: 1000.0000 mg | ORAL_CAPSULE | Freq: Once | ORAL | Status: DC

## 2016-03-18 MED ORDER — IPRATROPIUM-ALBUTEROL 0.5-2.5 (3) MG/3ML IN SOLN: 3.0000 mL | RESPIRATORY_TRACT | Status: DC | PRN

## 2016-03-18 MED ORDER — DEXTROSE 5 % IV SOLN
500.0000 mg | Freq: Once | INTRAVENOUS | Status: AC
  Administered 2016-03-18: 500 mg via INTRAVENOUS
  Filled 2016-03-18: qty 500

## 2016-03-18 MED ORDER — HEPARIN SODIUM (PORCINE) 5000 UNIT/ML IJ SOLN
5000.0000 [IU] | Freq: Three times a day (TID) | INTRAMUSCULAR | Status: DC
  Administered 2016-03-18 – 2016-03-21 (×7): 5000 [IU] via SUBCUTANEOUS
  Filled 2016-03-18 (×7): qty 1

## 2016-03-18 MED ORDER — ACETAMINOPHEN 325 MG PO TABS
650.0000 mg | ORAL_TABLET | ORAL | Status: DC | PRN
  Administered 2016-03-18: 650 mg via ORAL
  Filled 2016-03-18: qty 2

## 2016-03-18 MED ORDER — SODIUM CHLORIDE 0.9 % IV SOLN: 250.0000 mL | INTRAVENOUS | Status: DC | PRN

## 2016-03-18 MED ORDER — ATENOLOL 50 MG PO TABS
50.0000 mg | ORAL_TABLET | Freq: Every day | ORAL | Status: DC
  Administered 2016-03-18 – 2016-03-21 (×4): 50 mg via ORAL
  Filled 2016-03-18 (×4): qty 1

## 2016-03-18 MED ORDER — DEXTROSE 5 % IV SOLN
1.0000 g | Freq: Once | INTRAVENOUS | Status: AC
  Administered 2016-03-18: 1 g via INTRAVENOUS
  Filled 2016-03-18: qty 10

## 2016-03-18 MED ORDER — DEXTROSE 5 % IV SOLN
1.0000 g | INTRAVENOUS | Status: DC
  Filled 2016-03-18: qty 10

## 2016-03-18 MED ORDER — SODIUM CHLORIDE 0.9% FLUSH: 3.0000 mL | INTRAVENOUS | Status: DC | PRN

## 2016-03-18 MED ORDER — AZITHROMYCIN 250 MG PO TABS: 500.0000 mg | ORAL_TABLET | Freq: Once | ORAL | Status: DC

## 2016-03-18 MED ORDER — ASPIRIN 81 MG PO TBEC: 81.0000 mg | DELAYED_RELEASE_TABLET | Freq: Every day | ORAL | Status: DC

## 2016-03-18 MED ORDER — DEXTROSE 5 % IV SOLN: 500.0000 mg | INTRAVENOUS | Status: DC

## 2016-03-18 MED ORDER — INSULIN ASPART 100 UNIT/ML ~~LOC~~ SOLN
0.0000 [IU] | Freq: Three times a day (TID) | SUBCUTANEOUS | Status: DC
Start: 2016-03-18 — End: 2016-03-21
  Administered 2016-03-19: 1 [IU] via SUBCUTANEOUS
  Administered 2016-03-20: 2 [IU] via SUBCUTANEOUS
  Administered 2016-03-21: 1 [IU] via SUBCUTANEOUS

## 2016-03-18 MED ORDER — ALBUTEROL (5 MG/ML) CONTINUOUS INHALATION SOLN
10.0000 mg/h | INHALATION_SOLUTION | Freq: Once | RESPIRATORY_TRACT | Status: AC
  Administered 2016-03-18: 10 mg/h via RESPIRATORY_TRACT
  Filled 2016-03-18: qty 20

## 2016-03-18 MED ORDER — FERROUS SULFATE 325 (65 FE) MG PO TABS
325.0000 mg | ORAL_TABLET | Freq: Every day | ORAL | Status: DC
  Administered 2016-03-18 – 2016-03-21 (×4): 325 mg via ORAL
  Filled 2016-03-18 (×4): qty 1

## 2016-03-18 MED ORDER — LEVOTHYROXINE SODIUM 88 MCG PO TABS
88.0000 ug | ORAL_TABLET | Freq: Every day | ORAL | Status: DC
  Administered 2016-03-18 – 2016-03-21 (×4): 88 ug via ORAL
  Filled 2016-03-18 (×4): qty 1

## 2016-03-18 MED ORDER — IPRATROPIUM BROMIDE 0.02 % IN SOLN
0.5000 mg | Freq: Once | RESPIRATORY_TRACT | Status: AC
  Administered 2016-03-18: 0.5 mg via RESPIRATORY_TRACT
  Filled 2016-03-18: qty 2.5

## 2016-03-18 MED ORDER — PREDNISONE 20 MG PO TABS
60.0000 mg | ORAL_TABLET | Freq: Once | ORAL | Status: AC
  Administered 2016-03-18: 60 mg via ORAL
  Filled 2016-03-18: qty 3

## 2016-03-18 MED ORDER — SODIUM CHLORIDE 0.9% FLUSH
3.0000 mL | Freq: Two times a day (BID) | INTRAVENOUS | Status: DC
  Administered 2016-03-19 – 2016-03-20 (×3): 3 mL via INTRAVENOUS

## 2016-03-18 MED ORDER — GABAPENTIN 100 MG PO CAPS
100.0000 mg | ORAL_CAPSULE | Freq: Every day | ORAL | Status: DC
  Administered 2016-03-18 – 2016-03-20 (×3): 100 mg via ORAL
  Filled 2016-03-18 (×3): qty 1

## 2016-03-18 NOTE — ED Notes (Signed)
Pt transported to xray 

## 2016-03-18 NOTE — H&P (Signed)
History and Physical    [name]:[id number] DOB: [birth year] DOA: 03/18/2016  Referring MD/NP/PA: EDP PCP: Oliver Barre, MD  Outpatient Specialists: Patient Care Team: Corwin Levins, MD as PCP - General  Patient coming from:  Home   Chief Complaint:  HPI: [name] is a 75 y.o. female with medical history significant for HTN, hyperlipidemia, DM2 diet controlled, Asthma, chronic anemia and thrombocytopenia, presenting with 4 day history of increasing shortness of breath and increasing productive, green, thick cough, fevers up to 102, chills without night sweats, and right and central chest discomfort with coughing,. She took cough medicine without resolution of symptoms. She   Denies any cardiac  chest pain, chest wall pain or palpitations. Denies any abdominal pain. Has decreased appetite and nausea without vomiting due to mucus buildup. Denies dizziness or vertigo. Denies lower extremity swelling. No confusion was reported. Denies any vision changes,double vision or headaches.May have had  sick contacts 1 week prior as she cares for children. NO  recent long distant travels.    ED Course:  BP 120/60 mmHg  Pulse 92  Temp(Src) 102.7 F (39.3 C) (Oral)  Resp 26  Ht 5\' 3"  (1.6 m)  Wt 99.791 kg (220 lb)  BMI 38.98 kg/m2  SpO2 98% At the ED,  OSATs were normal while at rest, but became desaturated to 82 on RA, requiring O2 with improvement of sats. She is febrile. WBC 10.7, CXR remarkable for RLL Pneumonia with mild R pleural effusion. She was placed on Nebs and prdnisone and Rocephin x1. Cultures were drawn. Will admit for maangement of symptoms   Review of Systems: As per HPI otherwise 10 point review of systems negative.   Past Medical History  Diagnosis Date  . Hx of colonic polyps   . Chronic headaches     Chronic pain  . Hypothyroidism   . Hyperlipidemia   . Anemia     NOS  . DJD (degenerative joint disease)     Spine, hands  . Asthma   . Hearing loss   . Sickle  cell trait (HCC)   . Migraine   . Peptic ulcer disease   . Allergic rhinitis   . DDD (degenerative disc disease), cervical   . CTS (carpal tunnel syndrome) 8/08    Right, severe, emg/ncs; Left, moderate, emg/ncs  . Stenosing tenosynovitis of thumb     Left  . Hypertension   . Diabetes mellitus     Type II-diet  . Vitamin D deficiency   . Peripheral neuropathy (HCC)   . Disc disease, degenerative, cervical 06/07/2011  . Chronic neck pain 06/07/2011  . B12 deficiency 09/27/2011    Past Surgical History  Procedure Laterality Date  . Knee surgery    . Abdominal hysterectomy       reports that she has never smoked. She has never used smokeless tobacco. She reports that she does not drink alcohol or use illicit drugs.  Allergies  Allergen Reactions  . Naproxen Itching and Swelling    Throat gets itchy and swells  . Lactose Intolerance (Gi) Diarrhea    Family History  Problem Relation Age of Onset  . Diabetes Mother     DM  . Arthritis Other   . Hyperlipidemia Other   . Diabetes Other     1st degree relative    Family history reviewed and not pertinent (If you reviewed it)  Prior to Admission medications   Medication Sig Start Date End Date Taking? Authorizing Provider  alendronate (FOSAMAX) 70 MG tablet take 1 tablet by mouth every week on an empty stomach with 6-8 oz of water. No food / meds for 30 min. Remain Upright 07/24/15  Yes Corwin Levins, MD  aspirin 81 MG EC tablet Take 81 mg by mouth daily.     Yes Historical Provider, MD  atenolol (TENORMIN) 50 MG tablet Take 1 tablet (50 mg total) by mouth daily. 11/30/15  Yes Corwin Levins, MD  calcium-vitamin D (OSCAL WITH D) 500-200 MG-UNIT per tablet Take 1 tablet by mouth 2 (two) times daily.    Yes Historical Provider, MD  [facility] Liver Oil CAPS Take 1 capsule by mouth daily.     Yes Historical Provider, MD  ferrous sulfate 325 (65 FE) MG tablet Take 325 mg by mouth daily.     Yes Historical Provider, MD  gabapentin (NEURONTIN)  100 MG capsule Take 1 capsule (100 mg total) by mouth at bedtime. 01/04/16  Yes Judi Saa, DO  levothyroxine (SYNTHROID, LEVOTHROID) 88 MCG tablet Take 1 tablet (88 mcg total) by mouth daily. 11/30/15  Yes Corwin Levins, MD  nystatin cream (MYCOSTATIN) Apply topically 2 (two) times daily. 09/18/12  Yes Corwin Levins, MD  albuterol (PROVENTIL HFA;VENTOLIN HFA) 108 (90 BASE) MCG/ACT inhaler Inhale 2 puffs into the lungs every 6 (six) hours as needed for wheezing. 07/23/13 07/23/14  Corwin Levins, MD  alendronate (FOSAMAX) 70 MG tablet Take 1 tablet (70 mg total) by mouth every 7 (seven) days. Take with a full glass of water on an empty stomach. Patient taking differently: Take 70 mg by mouth every 7 (seven) days. Take with a full glass of water on an empty stomach. ( Monday ) 11/30/15   Corwin Levins, MD  diclofenac sodium (VOLTAREN) 1 % GEL Apply 4 g topically 4 (four) times daily. 11/30/15   Corwin Levins, MD  mometasone-formoterol (DULERA) 100-5 MCG/ACT AERO Inhale 2 puffs into the lungs 2 (two) times daily. 11/02/13   Newt Lukes, MD    Physical Exam:    Filed Vitals:   03/18/16 1328 03/18/16 1337 03/18/16 1443 03/18/16 1511  BP:  136/59 135/59 120/60  Pulse:  95 93 92  Temp:   102.7 F (39.3 C)   TempSrc:      Resp:  20 20 26   Height:      Weight:      SpO2: 82% 98% 98% 98%      Constitutional: uncomfortable due to increased coughing   Filed Vitals:   03/18/16 1328 03/18/16 1337 03/18/16 1443 03/18/16 1511  BP:  136/59 135/59 120/60  Pulse:  95 93 92  Temp:   102.7 F (39.3 C)   TempSrc:      Resp:  20 20 26   Height:      Weight:      SpO2: 82% 98% 98% 98%   Eyes: PERRL, lids and conjunctivae normal ENMT: Mucous membranes are moist. Posterior pharynx clear of any exudate or lesions.Normal dentition.  Neck: normal, supple, no masses, no thyromegaly Respiratory:Decreased breath sounds on the right, minimal wheezing, no rhonchi or rales  Normal respiratory effort. No  accessory muscle use.  Cardiovascular: Regular rate and rhythm, no murmurs / rubs / gallops. No extremity edema. 2+ pedal pulses. No carotid bruits.  Abdomen: obese no tenderness, no masses palpated. No hepatosplenomegaly. Bowel sounds positive.  Musculoskeletal: no clubbing / cyanosis. No joint deformity upper and lower extremities. Good ROM, no contractures. Normal muscle tone.  Skin: no rashes, lesions, ulcers. No induration Neurologic: CN 2-12 grossly intact. Sensation intact, DTR normal. Strength 5/5 in all 4.  Psychiatric: Normal judgment and insight. Alert and oriented x 3. Normal mood.     Labs on Admission: I have personally reviewed following labs and imaging studies  CBC:  Recent Labs Lab 03/18/16 1330  WBC 10.7*  NEUTROABS 8.5*  HGB 10.4*  HCT 31.7*  MCV 88.8  PLT 104*    Basic Metabolic Panel:  Recent Labs Lab 03/18/16 1330  NA 142  K 4.0  CL 106  CO2 24  GLUCOSE 144*  BUN 14  CREATININE 1.50*  CALCIUM 9.3    GFR: Estimated Creatinine Clearance: 37.1 mL/min (by C-G formula based on Cr of 1.5).   Urine analysis:    Component Value Date/Time   COLORURINE YELLOW 11/30/2015 1105   APPEARANCEUR CLEAR 11/30/2015 1105   LABSPEC 1.010 11/30/2015 1105   PHURINE 6.0 11/30/2015 1105   GLUCOSEU NEGATIVE 11/30/2015 1105   GLUCOSEU NEGATIVE 06/21/2010 1100   HGBUR TRACE-INTACT* 11/30/2015 1105   BILIRUBINUR NEGATIVE 11/30/2015 1105   KETONESUR NEGATIVE 11/30/2015 1105   PROTEINUR NEGATIVE 06/21/2010 1100   UROBILINOGEN 1.0 11/30/2015 1105   NITRITE NEGATIVE 11/30/2015 1105   LEUKOCYTESUR TRACE* 11/30/2015 1105    Sepsis Labs: @LABRCNTIP (procalcitonin:4,lacticidven:4) )No results found for this or any previous visit (from the past 240 hour(s)).   Radiological Exams on Admission: Dg Chest 2 View  03/18/2016  CLINICAL DATA:  Cough.  Shortness of breath, and wheezing. EXAM: CHEST - 2 VIEW COMPARISON:  11/02/2013. FINDINGS: Mild cardiac enlargement is  present. The lung volumes are low. Right lower lobe airspace disease it is best seen on the lateral view compatible with pneumonia. Mild pulmonary vascular congestion is evident. A right pleural effusion is suspected. The visualized soft tissues and bony thorax are unremarkable. IMPRESSION: 1. Right lower lobe pneumonia. 2. Mild pulmonary vascular congestion probable right pleural effusion. Electronically Signed   By: Marin Robertshristopher  Mattern M.D.   On: 03/18/2016 12:39    EKG: Independently reviewed.  Assessment/Plan Active Problems:   Hypothyroidism   Impaired glucose tolerance   OBESITY   THROMBOCYTOPENIA   Essential hypertension   CKD (chronic kidney disease)   CAP (community acquired pneumonia)   Headache   Acute hypoxic respiratory failure due to CAP in a patient with asthma  At this point time patient is hypoxic at 82 on RA with ambulation CXR RLL Pneumonia with mild R pleural effusion. Mild Leukocytosis with WBC 10.7. Fever up to 102.  Received Rocephin x1 in ER with nebs x1 without significant improvement.  Tele Obs - Levaquin - Duonebs  Steroids   - Mucinex - O2 - CBC Check Flu   Chronic kidney disease stage IIIB 30-44  baseline creatinine 1.4-1.5     -  Minimize nephrotoxins and renally dose medications Lab Results  Component Value Date   CREATININE 1.50* 03/18/2016   CREATININE 1.42* 11/30/2015   CREATININE 1.7* 08/07/2014  IVFluids as needed  Repeat BMET in am  Hypothyroidism: Chronic.  -Continue home Synthroid   Thrombocytopenia, chronic, in the setting of infection  current Plt count 104k  No bleeding issues noted Repeat CBC in am   Hypertension BP 120/60 mmHg  Pulse 92  Temp(Src) 102.7 F (39.3 C) (Oral)  Resp 26  Ht 5\' 3"  (1.6 m)  Wt 99.791 kg (220 lb)  BMI 38.98 kg/m2  SpO2 98% Continue home anti-hypertensive medications.  Add Hydralazine Q6 hours as needed for SBP >  160 and /or DBP >110.   DM2 diet controlled, with peripheral neuropathy  not on  oral antiglycemics Current Glu 144 Last A1C 5.4 in Jan 2017  SSI Continue to monitor while on steroids Continue Neurontin  Anemia of chronic disease.  Hemoglobin 10.4 on admission  No bleeding issues, no indication for transfusion Continue oral iron Repeat CBC   History of migraine headaches Tylenol as needed  Deconditioning Consult PT/OT/Nutrition  DVT prophylaxis: Heparin Code Status:   Full   Family Communication:  Discussed with patient Disposition Plan: Expect patient to be discharged to home Consults called:    None Admission status: Obs Tele    Nicola Heinemann E, PA-C [facility]   If 7PM-7AM, please contact night-coverage [email or web address] Password [facility]  03/18/2016, 3:34 PM

## 2016-03-18 NOTE — ED Notes (Signed)
Patient's pulse oximetry dropped to 82% upon ambulation

## 2016-03-18 NOTE — ED Notes (Signed)
Pt presents with 3-4 day h/o shortness of breath and cough.  Pt reports cough is productive, reports phlegm is green and yellow.  325mg  ASA given PTA.

## 2016-03-18 NOTE — ED Notes (Signed)
Attempt to casll  Report

## 2016-03-19 ENCOUNTER — Observation Stay (HOSPITAL_COMMUNITY): Payer: Medicare Other

## 2016-03-19 DIAGNOSIS — J189 Pneumonia, unspecified organism: Secondary | ICD-10-CM | POA: Diagnosis not present

## 2016-03-19 DIAGNOSIS — J9601 Acute respiratory failure with hypoxia: Secondary | ICD-10-CM | POA: Diagnosis not present

## 2016-03-19 DIAGNOSIS — R05 Cough: Secondary | ICD-10-CM | POA: Diagnosis not present

## 2016-03-19 DIAGNOSIS — R7302 Impaired glucose tolerance (oral): Secondary | ICD-10-CM | POA: Diagnosis not present

## 2016-03-19 LAB — EXPECTORATED SPUTUM ASSESSMENT W REFEX TO RESP CULTURE

## 2016-03-19 LAB — COMPREHENSIVE METABOLIC PANEL
ALT: 14 U/L (ref 14–54)
ANION GAP: 12 (ref 5–15)
AST: 20 U/L (ref 15–41)
Albumin: 3.1 g/dL — ABNORMAL LOW (ref 3.5–5.0)
Alkaline Phosphatase: 40 U/L (ref 38–126)
BUN: 16 mg/dL (ref 6–20)
CALCIUM: 8.8 mg/dL — AB (ref 8.9–10.3)
CHLORIDE: 104 mmol/L (ref 101–111)
CO2: 24 mmol/L (ref 22–32)
CREATININE: 1.29 mg/dL — AB (ref 0.44–1.00)
GFR, EST AFRICAN AMERICAN: 46 mL/min — AB (ref >60)
GFR, EST NON AFRICAN AMERICAN: 40 mL/min — AB (ref >60)
Glucose, Bld: 132 mg/dL — ABNORMAL HIGH (ref 65–99)
Potassium: 4.7 mmol/L (ref 3.5–5.1)
SODIUM: 140 mmol/L (ref 135–145)
Total Bilirubin: 0.7 mg/dL (ref 0.3–1.2)
Total Protein: 6.9 g/dL (ref 6.5–8.1)

## 2016-03-19 LAB — INFLUENZA PANEL BY PCR (TYPE A & B)
H1N1FLUPCR: NOT DETECTED
INFLAPCR: NEGATIVE
Influenza B By PCR: NEGATIVE

## 2016-03-19 LAB — STREP PNEUMONIAE URINARY ANTIGEN: STREP PNEUMO URINARY ANTIGEN: NEGATIVE

## 2016-03-19 LAB — CBC
HCT: 29.1 % — ABNORMAL LOW (ref 36.0–46.0)
HEMOGLOBIN: 9.5 g/dL — AB (ref 12.0–15.0)
MCH: 29 pg (ref 26.0–34.0)
MCHC: 32.6 g/dL (ref 30.0–36.0)
MCV: 88.7 fL (ref 78.0–100.0)
PLATELETS: 124 10*3/uL — AB (ref 150–400)
RBC: 3.28 MIL/uL — AB (ref 3.87–5.11)
RDW: 13.6 % (ref 11.5–15.5)
WBC: 12.3 10*3/uL — AB (ref 4.0–10.5)

## 2016-03-19 LAB — GLUCOSE, CAPILLARY
GLUCOSE-CAPILLARY: 114 mg/dL — AB (ref 65–99)
GLUCOSE-CAPILLARY: 109 mg/dL — AB (ref 65–99)
GLUCOSE-CAPILLARY: 128 mg/dL — AB (ref 65–99)
GLUCOSE-CAPILLARY: 147 mg/dL — AB (ref 65–99)

## 2016-03-19 LAB — EXPECTORATED SPUTUM ASSESSMENT W GRAM STAIN, RFLX TO RESP C

## 2016-03-19 MED ORDER — PREDNISONE 20 MG PO TABS
40.0000 mg | ORAL_TABLET | Freq: Every day | ORAL | Status: DC
  Administered 2016-03-19 – 2016-03-21 (×3): 40 mg via ORAL
  Filled 2016-03-19 (×3): qty 2

## 2016-03-19 MED ORDER — IPRATROPIUM-ALBUTEROL 0.5-2.5 (3) MG/3ML IN SOLN
3.0000 mL | Freq: Three times a day (TID) | RESPIRATORY_TRACT | Status: DC
  Administered 2016-03-19 – 2016-03-21 (×5): 3 mL via RESPIRATORY_TRACT
  Filled 2016-03-19 (×5): qty 3

## 2016-03-19 MED ORDER — IPRATROPIUM-ALBUTEROL 0.5-2.5 (3) MG/3ML IN SOLN
3.0000 mL | Freq: Four times a day (QID) | RESPIRATORY_TRACT | Status: DC
  Administered 2016-03-19: 3 mL via RESPIRATORY_TRACT
  Filled 2016-03-19: qty 3

## 2016-03-19 MED ORDER — LEVOFLOXACIN IN D5W 750 MG/150ML IV SOLN
750.0000 mg | INTRAVENOUS | Status: DC
  Administered 2016-03-19: 750 mg via INTRAVENOUS
  Filled 2016-03-19 (×2): qty 150

## 2016-03-19 MED ORDER — GUAIFENESIN ER 600 MG PO TB12
600.0000 mg | ORAL_TABLET | Freq: Two times a day (BID) | ORAL | Status: DC
  Administered 2016-03-19 – 2016-03-21 (×5): 600 mg via ORAL
  Filled 2016-03-19 (×5): qty 1

## 2016-03-19 NOTE — Progress Notes (Signed)
PROGRESS NOTE    [name]  WUJ:[id number] DOB: 16-Jun-1941 DOA: 03/18/2016 PCP: Oliver BarreJames John, MD Outpatient Specialists:   Brief Narrative: 75 yo woman with a history of HTN, HLD, DM, asthma, and chronic anemia and thrombocytopenia admitted for management of mild sepsis, present on admission, secondary to CAP (RLL).  Assessment/Plan Active Problems: CAP (community acquired pneumonia)  Hypothyroidism  Impaired glucose tolerance  OBESITY  THROMBOCYTOPENIA  Essential hypertension  CKD (chronic kidney disease)  CAP (community acquired pneumonia)  Headache  Acute hypoxic respiratory failure due to CAP in a patient with asthma.  The patient is not on home oxygen and is still demonstrating a oxygen requirement 2L [place].  She is not ready for discharge yet. Flu screen negative. --Continue IV levaquin --Steroid taper --Anti-tussives, mucolytics as needed --Wean oxygen as tolerated  Chronic kidney disease stage IIIB 30-44 baseline creatinine 1.4-1.5  - Minimize nephrotoxins and renally dose medications  Recent Labs    Lab Results  Component Value Date   CREATININE 1.50* 03/18/2016   CREATININE 1.42* 11/30/2015   CREATININE 1.7* 08/07/2014    Creatinine improved to baseline. Continue to monitor.  Hypothyroidism: Chronic.  -Continue home Synthroid  Thrombocytopenia, chronic, in the setting of infection current Plt count 104k  Follow CBC, caution with heparin products  Hypertension  Add Hydralazine Q6 hours as needed for SBP >160 and /or DBP >110.  Continue home medications  DM2 diet controlled, with peripheral neuropathy not on oral antiglycemics Current Glu 144 Last A1C 5.4 in Jan 2017  SSI Continue to monitor while on steroids Continue Neurontin  Anemia of chronic disease. Hemoglobin 10.4 on admission  No bleeding issues, no indication for transfusion Continue oral iron Follow CBC  History of migraine headaches Tylenol as  needed  Deconditioning Consult PT/OT/Nutrition  DVT prophylaxis: Heparin Code Status: Full  Family Communication: Discussed with patient, son at bedside today Disposition Plan: Expect patient to be discharged to home Consults called: None Admission status: Transition to inpatient     Consultants:   None  Procedures: None  Antimicrobials:   Rocephin/Azithromycin 5/5  Levaquin 5/6 -    Subjective: Feels better, still has cough, still requiring oxygen  Objective: Filed Vitals:   03/18/16 1615 03/18/16 1726 03/18/16 2154 03/19/16 0513  BP: 119/64 114/54 125/52 115/68  Pulse: 82 80 70 61  Temp:  99.3 F (37.4 C) 98.8 F (37.1 C) 99.1 F (37.3 C)  TempSrc:   Oral Oral  Resp: 29 20 20 18   Height:      Weight:      SpO2: 95% 97% 98% 100%    Intake/Output Summary (Last 24 hours) at 03/19/16 1215 Last data filed at 03/19/16 [id number]  Gross per 24 hour  Intake    240 ml  Output      1 ml  Net    239 ml   Filed Weights   03/18/16 1034  Weight: 99.791 kg (220 lb)    Examination: General exam: Appears calm and comfortable  Respiratory system: Bilateral ronchi with crackles at the right lung base Cardiovascular system: S1 & S2 heard, RRR. No pedal edema. Gastrointestinal system: Abdomen is nondistended, soft and nontender. Normal bowel sounds heard. Central nervous system: Alert and oriented. No focal neurological deficits. MSK: Moves all four extremities spontaneously Skin: Chronic stasis changes in bilateral lower extremities  Psychiatry: Judgement and insight appear normal. Mood & affect appropriate.   Data Reviewed: I have personally reviewed following labs and imaging studies  CBC:  Recent Labs  Lab 03/18/16 1330 03/18/16 1655 03/19/16 0430  WBC 10.7* 9.5 12.3*  NEUTROABS 8.5*  --   --   HGB 10.4* 10.0* 9.5*  HCT 31.7* 30.5* 29.1*  MCV 88.8 87.6 88.7  PLT 104* 109* 124*   Basic Metabolic Panel:  Recent Labs Lab 03/18/16 1330  03/18/16 1655 03/19/16 0430  NA 142  --  140  K 4.0  --  4.7  CL 106  --  104  CO2 24  --  24  GLUCOSE 144*  --  132*  BUN 14  --  16  CREATININE 1.50* 1.54* 1.29*  CALCIUM 9.3  --  8.8*   GFR: Estimated Creatinine Clearance: 43.1 mL/min (by C-G formula based on Cr of 1.29). Liver Function Tests:  Recent Labs Lab 03/19/16 0430  AST 20  ALT 14  ALKPHOS 40  BILITOT 0.7  PROT 6.9  ALBUMIN 3.1*   CBG:  Recent Labs Lab 03/18/16 1741 03/18/16 2151 03/19/16 0818  GLUCAP 182* 184* 114*   Urine analysis:    Component Value Date/Time   COLORURINE YELLOW 11/30/2015 1105   APPEARANCEUR CLEAR 11/30/2015 1105   LABSPEC 1.010 11/30/2015 1105   PHURINE 6.0 11/30/2015 1105   GLUCOSEU NEGATIVE 11/30/2015 1105   GLUCOSEU NEGATIVE 06/21/2010 1100   HGBUR TRACE-INTACT* 11/30/2015 1105   BILIRUBINUR NEGATIVE 11/30/2015 1105   KETONESUR NEGATIVE 11/30/2015 1105   PROTEINUR NEGATIVE 06/21/2010 1100   UROBILINOGEN 1.0 11/30/2015 1105   NITRITE NEGATIVE 11/30/2015 1105   LEUKOCYTESUR TRACE* 11/30/2015 1105    Radiology Studies: Dg Chest 2 View  03/19/2016  CLINICAL DATA:  Shortness of breath and wheezing for 5 days.  Cough. EXAM: CHEST  2 VIEW COMPARISON:  Mar 18, 2016 FINDINGS: There is patchy infiltrate in the right base. Lungs elsewhere are clear. Heart is upper normal in size with pulmonary vascularity within normal limits. No adenopathy. No bone lesions. IMPRESSION: Patchy infiltrate right base. Lungs elsewhere clear. Stable cardiac silhouette. Electronically Signed   By: Bretta Bang III M.D.   On: 03/19/2016 10:33   Dg Chest 2 View  03/18/2016  CLINICAL DATA:  Cough.  Shortness of breath, and wheezing. EXAM: CHEST - 2 VIEW COMPARISON:  11/02/2013. FINDINGS: Mild cardiac enlargement is present. The lung volumes are low. Right lower lobe airspace disease it is best seen on the lateral view compatible with pneumonia. Mild pulmonary vascular congestion is evident. A right pleural  effusion is suspected. The visualized soft tissues and bony thorax are unremarkable. IMPRESSION: 1. Right lower lobe pneumonia. 2. Mild pulmonary vascular congestion probable right pleural effusion. Electronically Signed   By: Marin Roberts M.D.   On: 03/18/2016 12:39        Scheduled Meds: . atenolol  50 mg Oral Daily  . azithromycin  500 mg Intravenous Q24H  . cefTRIAXone (ROCEPHIN)  IV  1 g Intravenous Q24H  . ferrous sulfate  325 mg Oral Daily  . gabapentin  100 mg Oral QHS  . heparin  5,000 Units Subcutaneous Q8H  . insulin aspart  0-9 Units Subcutaneous TID WC  . levothyroxine  88 mcg Oral QAC breakfast  . sodium chloride flush  3 mL Intravenous Q12H   Continuous Infusions:       Time spent: 25 minutes    Jerene Bears, MD [facility]  03/19/2016, 12:15 PM

## 2016-03-19 NOTE — Progress Notes (Signed)
Pharmacy Antibiotic Note  [name] is a 75 y.o. female admitted on 03/18/2016 with pneumonia.  Pharmacy has been consulted for levo dosing. AF, wbc up 12.3, CrCl~43. Received CTX/azithro x1 on 5/5 at ~1400.  Plan: Levo 750mg  IV q48h to start at 1400 Monitor clinical progress, c/s, renal function, abx plan/LOT   Height: 5\' 3"  (160 cm) Weight: 220 lb (99.791 kg) IBW/kg (Calculated) : 52.4  Temp (24hrs), Avg:100 F (37.8 C), Min:98.8 F (37.1 C), Max:102.7 F (39.3 C)   Recent Labs Lab 03/18/16 1330 03/18/16 1655 03/19/16 0430  WBC 10.7* 9.5 12.3*  CREATININE 1.50* 1.54* 1.29*    Estimated Creatinine Clearance: 43.1 mL/min (by C-G formula based on Cr of 1.29).    Allergies  Allergen Reactions  . Naproxen Itching and Swelling    Throat gets itchy and swells  . Lactose Intolerance (Gi) Diarrhea    Antimicrobials this admission: 5/6 levo >>  5/5 CTX x1 5/5 azithro x1  Dose adjustments this admission: n/a  Microbiology results: 5/6 BCx:  5/6 UCx:   5/6 Sputum:     [name], PharmD, [facility] Clinical Pharmacist Pager [phone] 03/19/2016 12:30 PM

## 2016-03-20 ENCOUNTER — Encounter: Payer: Self-pay | Admitting: Internal Medicine

## 2016-03-20 DIAGNOSIS — J189 Pneumonia, unspecified organism: Secondary | ICD-10-CM | POA: Diagnosis not present

## 2016-03-20 DIAGNOSIS — R7302 Impaired glucose tolerance (oral): Secondary | ICD-10-CM

## 2016-03-20 LAB — GLUCOSE, CAPILLARY
GLUCOSE-CAPILLARY: 192 mg/dL — AB (ref 65–99)
Glucose-Capillary: 125 mg/dL — ABNORMAL HIGH (ref 65–99)
Glucose-Capillary: 183 mg/dL — ABNORMAL HIGH (ref 65–99)
Glucose-Capillary: 117 mg/dL — ABNORMAL HIGH (ref 65–99)

## 2016-03-20 LAB — URINE CULTURE: Culture: NO GROWTH

## 2016-03-20 MED ORDER — DOCUSATE SODIUM 100 MG PO CAPS
100.0000 mg | ORAL_CAPSULE | Freq: Two times a day (BID) | ORAL | Status: DC
  Administered 2016-03-20 – 2016-03-21 (×2): 100 mg via ORAL
  Filled 2016-03-20 (×2): qty 1

## 2016-03-20 MED ORDER — MAGNESIUM HYDROXIDE 400 MG/5ML PO SUSP: 30.0000 mL | Freq: Every day | ORAL | Status: DC | PRN

## 2016-03-20 MED ORDER — PSYLLIUM 95 % PO PACK
1.0000 | PACK | Freq: Every day | ORAL | Status: DC
  Administered 2016-03-20 – 2016-03-21 (×2): 1 via ORAL
  Filled 2016-03-20 (×2): qty 1
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
## 2016-03-20 NOTE — Progress Notes (Signed)
PROGRESS NOTE    [name]  ZOX:[id number] DOB: October 18, 1941 DOA: 03/18/2016 PCP: Oliver Barre, MD Outpatient Specialists:   Brief Narrative: 75 yo woman with a history of HTN, HLD, DM, asthma, and chronic anemia and thrombocytopenia admitted for management of mild sepsis, present on admission, secondary to CAP ([facility]).  Assessment/Plan Active Problems: CAP (community acquired pneumonia)  Hypothyroidism  Impaired glucose tolerance  OBESITY  THROMBOCYTOPENIA  Essential hypertension  CKD (chronic kidney disease)  CAP (community acquired pneumonia)  Headache  Acute hypoxic respiratory failure due to CAP in a patient with asthma.  The patient is not on home oxygen.  O2 sats on RA 96% while I was at bedside today.  Leave oxygen OFF now and monitor.  If stable, could be ready for discharge by tomorrow. Flu screen negative. --Continue IV levaquin, dosing per pharmacy.  Transition to PO at discharge --Steroid taper at discharge --Anti-tussives, mucolytics as needed --If clinical decline, low threshold for CT chest (this has been discussed with patient's son and daughter).  Chronic kidney disease stage IIIB 30-44 baseline creatinine 1.4-1.5  - Minimize nephrotoxins and renally dose medications  Recent Labs    Lab Results  Component Value Date   CREATININE 1.50* 03/18/2016   CREATININE 1.42* 11/30/2015   CREATININE 1.7* 08/07/2014    Creatinine improved to baseline. Continue to monitor.  Hypothyroidism: Chronic.  -Continue home Synthroid  Thrombocytopenia, chronic Follow CBC, caution with heparin products  Hypertension  Add Hydralazine Q6 hours as needed for SBP >160 and /or DBP >110.  Continue home medications  DM2 diet controlled, with peripheral neuropathy not on oral antiglycemics Last A1C 5.4 in Jan 2017  SSI (patient's daughter advised that she should not need insulin at discharge) Continue to monitor while on steroids Continue  Neurontin  Anemia of chronic disease. Hemoglobin 10.4 on admission  No bleeding issues, no indication for transfusion Continue oral iron Follow CBC  History of migraine headaches Tylenol as needed  Deconditioning Consult PT/OT/Nutrition  DVT prophylaxis: Heparin Code Status: Full  Family Communication: Discussed with patient, daughter at bedside today Disposition Plan: Expect patient to be discharged to home Consults called: None Admission status: Inpatient     Consultants:   None  Procedures: None  Antimicrobials:   Rocephin/Azithromycin 5/5  Levaquin 5/6 -    Subjective: Feels better, looks better, mild hemoptysis today.  Flutter valve helping with sputum production.  Objective: Filed Vitals:   03/20/16 0919 03/20/16 1418 03/20/16 1554 03/20/16 2012  BP:   140/71   Pulse:   72 65  Temp:   98.7 F (37.1 C)   TempSrc:   Oral   Resp:   16 20  Height:      Weight:      SpO2: 98% 97% 95% 96%    Intake/Output Summary (Last 24 hours) at 03/20/16 2141 Last data filed at 03/19/16 2253  Gross per 24 hour  Intake      3 ml  Output      0 ml  Net      3 ml   Filed Weights   03/18/16 1034  Weight: 99.791 kg (220 lb)    Examination: General exam: Appears calm and comfortable  Respiratory system: Ronchi resolved.  No wheeze.  Faint crackles remain on the right. Cardiovascular system: S1 & S2 heard, RRR. No pedal edema. Gastrointestinal system: Abdomen is nondistended, soft and nontender. Normal bowel sounds heard. Central nervous system: Alert and oriented. No focal neurological deficits. MSK: Moves all four  extremities spontaneously Skin: Chronic stasis changes in bilateral lower extremities  Psychiatry: Judgement and insight appear normal. Mood & affect appropriate.   Data Reviewed: I have personally reviewed following labs and imaging studies  CBC:  Recent Labs Lab 03/18/16 1330 03/18/16 1655 03/19/16 0430  WBC 10.7* 9.5 12.3*   NEUTROABS 8.5*  --   --   HGB 10.4* 10.0* 9.5*  HCT 31.7* 30.5* 29.1*  MCV 88.8 87.6 88.7  PLT 104* 109* 124*   Basic Metabolic Panel:  Recent Labs Lab 03/18/16 1330 03/18/16 1655 03/19/16 0430  NA 142  --  140  K 4.0  --  4.7  CL 106  --  104  CO2 24  --  24  GLUCOSE 144*  --  132*  BUN 14  --  16  CREATININE 1.50* 1.54* 1.29*  CALCIUM 9.3  --  8.8*   GFR: Estimated Creatinine Clearance: 43.1 mL/min (by C-G formula based on Cr of 1.29). Liver Function Tests:  Recent Labs Lab 03/19/16 0430  AST 20  ALT 14  ALKPHOS 40  BILITOT 0.7  PROT 6.9  ALBUMIN 3.1*   CBG:  Recent Labs Lab 03/19/16 2234 03/20/16 0813 03/20/16 1238 03/20/16 1723 03/20/16 2026  GLUCAP 147* 125* 183* 117* 192*   Urine analysis:    Component Value Date/Time   COLORURINE YELLOW 11/30/2015 1105   APPEARANCEUR CLEAR 11/30/2015 1105   LABSPEC 1.010 11/30/2015 1105   PHURINE 6.0 11/30/2015 1105   GLUCOSEU NEGATIVE 11/30/2015 1105   GLUCOSEU NEGATIVE 06/21/2010 1100   HGBUR TRACE-INTACT* 11/30/2015 1105   BILIRUBINUR NEGATIVE 11/30/2015 1105   KETONESUR NEGATIVE 11/30/2015 1105   PROTEINUR NEGATIVE 06/21/2010 1100   UROBILINOGEN 1.0 11/30/2015 1105   NITRITE NEGATIVE 11/30/2015 1105   LEUKOCYTESUR TRACE* 11/30/2015 1105    Radiology Studies: Dg Chest 2 View  03/19/2016  CLINICAL DATA:  Shortness of breath and wheezing for 5 days.  Cough. EXAM: CHEST  2 VIEW COMPARISON:  Mar 18, 2016 FINDINGS: There is patchy infiltrate in the right base. Lungs elsewhere are clear. Heart is upper normal in size with pulmonary vascularity within normal limits. No adenopathy. No bone lesions. IMPRESSION: Patchy infiltrate right base. Lungs elsewhere clear. Stable cardiac silhouette. Electronically Signed   By: Bretta BangWilliam  Woodruff III M.D.   On: 03/19/2016 10:33        Scheduled Meds: . atenolol  50 mg Oral Daily  . ferrous sulfate  325 mg Oral Daily  . gabapentin  100 mg Oral QHS  . guaiFENesin  600  mg Oral BID  . heparin  5,000 Units Subcutaneous Q8H  . insulin aspart  0-9 Units Subcutaneous TID WC  . ipratropium-albuterol  3 mL Nebulization TID  . levofloxacin (LEVAQUIN) IV  750 mg Intravenous Q48H  . levothyroxine  88 mcg Oral QAC breakfast  . predniSONE  40 mg Oral Q breakfast  . sodium chloride flush  3 mL Intravenous Q12H   Continuous Infusions:       Time spent: 25 minutes    Jerene Bearsarter,Rayvion Stumph Harrison, MD [facility]  03/20/2016, 9:41 PM

## 2016-03-21 DIAGNOSIS — N183 Chronic kidney disease, stage 3 (moderate): Secondary | ICD-10-CM | POA: Diagnosis not present

## 2016-03-21 DIAGNOSIS — J9601 Acute respiratory failure with hypoxia: Secondary | ICD-10-CM | POA: Diagnosis not present

## 2016-03-21 DIAGNOSIS — J189 Pneumonia, unspecified organism: Secondary | ICD-10-CM | POA: Diagnosis not present

## 2016-03-21 LAB — BASIC METABOLIC PANEL
ANION GAP: 12 (ref 5–15)
BUN: 23 mg/dL — ABNORMAL HIGH (ref 6–20)
CALCIUM: 9.2 mg/dL (ref 8.9–10.3)
CO2: 22 mmol/L (ref 22–32)
CREATININE: 1.4 mg/dL — AB (ref 0.44–1.00)
Chloride: 105 mmol/L (ref 101–111)
GFR calc non Af Amer: 36 mL/min — ABNORMAL LOW (ref >60)
GFR, EST AFRICAN AMERICAN: 42 mL/min — AB (ref >60)
Glucose, Bld: 99 mg/dL (ref 65–99)
Potassium: 4 mmol/L (ref 3.5–5.1)
SODIUM: 139 mmol/L (ref 135–145)

## 2016-03-21 LAB — CBC
HEMATOCRIT: 32 % — AB (ref 36.0–46.0)
HEMOGLOBIN: 10.4 g/dL — AB (ref 12.0–15.0)
MCH: 29 pg (ref 26.0–34.0)
MCHC: 32.5 g/dL (ref 30.0–36.0)
MCV: 89.1 fL (ref 78.0–100.0)
Platelets: 231 10*3/uL (ref 150–400)
RBC: 3.59 MIL/uL — ABNORMAL LOW (ref 3.87–5.11)
RDW: 13.2 % (ref 11.5–15.5)
WBC: 15.4 10*3/uL — AB (ref 4.0–10.5)

## 2016-03-21 LAB — LEGIONELLA PNEUMOPHILA SEROGP 1 UR AG: L. PNEUMOPHILA SEROGP 1 UR AG: NEGATIVE

## 2016-03-21 LAB — GLUCOSE, CAPILLARY
GLUCOSE-CAPILLARY: 88 mg/dL (ref 65–99)
Glucose-Capillary: 136 mg/dL — ABNORMAL HIGH (ref 65–99)
Glucose-Capillary: 148 mg/dL — ABNORMAL HIGH (ref 65–99)

## 2016-03-21 MED ORDER — GUAIFENESIN ER 600 MG PO TB12
600.0000 mg | ORAL_TABLET | Freq: Two times a day (BID) | ORAL | Status: DC
Start: 2016-03-21 — End: 2017-03-22

## 2016-03-21 MED ORDER — GUAIFENESIN-DM 100-10 MG/5ML PO SYRP
10.0000 mL | ORAL_SOLUTION | Freq: Four times a day (QID) | ORAL | Status: DC
  Administered 2016-03-21: 10 mL via ORAL
  Filled 2016-03-21: qty 10

## 2016-03-21 MED ORDER — PREDNISONE 20 MG PO TABS: 40.0000 mg | ORAL_TABLET | Freq: Every day | ORAL | Status: DC

## 2016-03-21 MED ORDER — GUAIFENESIN-DM 100-10 MG/5ML PO SYRP: 10.0000 mL | ORAL_SOLUTION | Freq: Four times a day (QID) | ORAL | Status: DC

## 2016-03-21 MED ORDER — LEVOFLOXACIN 750 MG PO TABS
750.0000 mg | ORAL_TABLET | ORAL | Status: DC
  Administered 2016-03-21: 750 mg via ORAL
  Filled 2016-03-21: qty 1

## 2016-03-21 MED ORDER — LEVOFLOXACIN 750 MG PO TABS: 750.0000 mg | ORAL_TABLET | ORAL | Status: DC

## 2016-03-21 NOTE — Discharge Summary (Signed)
Physician Discharge Summary  [name] MRN: [id number] DOB/AGE: [birth year] 75 y.o.  PCP: Cathlean Cower, MD   Admit date: 03/18/2016 Discharge date: 03/21/2016  Discharge Diagnoses:   Active Problems:   Hypothyroidism   Impaired glucose tolerance   OBESITY   THROMBOCYTOPENIA   Essential hypertension   CKD (chronic kidney disease)   CAP (community acquired pneumonia)   Headache   Community acquired pneumonia   Acute hypoxemic respiratory failure ([place])    Follow-up recommendations Follow-up with PCP in 3-5 days , including all  additional recommended appointments as below Follow-up CBC, CMP in 3-5 days       Current Discharge Medication List    START taking these medications   Details  guaiFENesin (MUCINEX) 600 MG 12 hr tablet Take 1 tablet (600 mg total) by mouth 2 (two) times daily. Qty: 20 tablet, Refills: 0    guaiFENesin-dextromethorphan (ROBITUSSIN DM) 100-10 MG/5ML syrup Take 10 mLs by mouth every 6 (six) hours. Qty: 118 mL, Refills: 0    levofloxacin (LEVAQUIN) 750 MG tablet Take 1 tablet (750 mg total) by mouth every other day. Qty: 4 tablet, Refills: 0    predniSONE (DELTASONE) 20 MG tablet Take 2 tablets (40 mg total) by mouth daily with breakfast. Qty: 5 tablet, Refills: 0      CONTINUE these medications which have NOT CHANGED   Details  !! alendronate (FOSAMAX) 70 MG tablet take 1 tablet by mouth every week on an empty stomach with 6-8 oz of water. No food / meds for 30 min. Remain Upright Qty: 12 tablet, Refills: 1    aspirin 81 MG EC tablet Take 81 mg by mouth daily.      atenolol (TENORMIN) 50 MG tablet Take 1 tablet (50 mg total) by mouth daily. Qty: 90 tablet, Refills: 3    calcium-vitamin D (OSCAL WITH D) 500-200 MG-UNIT per tablet Take 1 tablet by mouth 2 (two) times daily.     Cod Liver Oil CAPS Take 1 capsule by mouth daily.      ferrous sulfate 325 (65 FE) MG tablet Take 325 mg by mouth daily.      gabapentin (NEURONTIN) 100 MG  capsule Take 1 capsule (100 mg total) by mouth at bedtime. Qty: 30 capsule, Refills: 3    levothyroxine (SYNTHROID, LEVOTHROID) 88 MCG tablet Take 1 tablet (88 mcg total) by mouth daily. Qty: 90 tablet, Refills: 3    nystatin cream (MYCOSTATIN) Apply topically 2 (two) times daily. Qty: 30 g, Refills: 1    albuterol (PROVENTIL HFA;VENTOLIN HFA) 108 (90 BASE) MCG/ACT inhaler Inhale 2 puffs into the lungs every 6 (six) hours as needed for wheezing. Qty: 1 Inhaler, Refills: 3   Associated Diagnoses: Asthma exacerbation    !! alendronate (FOSAMAX) 70 MG tablet Take 1 tablet (70 mg total) by mouth every 7 (seven) days. Take with a full glass of water on an empty stomach. Qty: 12 tablet, Refills: 3    diclofenac sodium (VOLTAREN) 1 % GEL Apply 4 g topically 4 (four) times daily. Qty: 400 g, Refills: 11    mometasone-formoterol (DULERA) 100-5 MCG/ACT AERO Inhale 2 puffs into the lungs 2 (two) times daily. Qty: 13 g, Refills: 2     !! - Potential duplicate medications found. Please discuss with provider.       Discharge Condition:   Stable    Discharge Instructions Get Medicines reviewed and adjusted: Please take all your medications with you for your next visit with your Primary MD  Please  request your Primary MD to go over all hospital tests and procedure/radiological results at the follow up, please ask your Primary MD to get all Hospital records sent to his/her office.  If you experience worsening of your admission symptoms, develop shortness of breath, life threatening emergency, suicidal or homicidal thoughts you must seek medical attention immediately by calling 911 or calling your MD immediately if symptoms less severe.  You must read complete instructions/literature along with all the possible adverse reactions/side effects for all the Medicines you take and that have been prescribed to you. Take any new Medicines after you have completely understood and accpet all the  possible adverse reactions/side effects.   Do not drive when taking Pain medications.   Do not take more than prescribed Pain, Sleep and Anxiety Medications  Special Instructions: If you have smoked or chewed Tobacco in the last 2 yrs please stop smoking, stop any regular Alcohol and or any Recreational drug use.  Wear Seat belts while driving.  Please note  You were cared for by a hospitalist during your hospital stay. Once you are discharged, your primary care physician will handle any further medical issues. Please note that NO REFILLS for any discharge medications will be authorized once you are discharged, as it is imperative that you return to your primary care physician (or establish a relationship with a primary care physician if you do not have one) for your aftercare needs so that they can reassess your need for medications and monitor your lab values.  Discharge Instructions    Diet - low sodium heart healthy    Complete by:  As directed      Increase activity slowly    Complete by:  As directed             Allergies  Allergen Reactions  . Naproxen Itching and Swelling    Throat gets itchy and swells  . Lactose Intolerance (Gi) Diarrhea      Disposition: 01-Home or Self Care   Consults: * None    Significant Diagnostic Studies:  Dg Chest 2 View  03/19/2016  CLINICAL DATA:  Shortness of breath and wheezing for 5 days.  Cough. EXAM: CHEST  2 VIEW COMPARISON:  Mar 18, 2016 FINDINGS: There is patchy infiltrate in the right base. Lungs elsewhere are clear. Heart is upper normal in size with pulmonary vascularity within normal limits. No adenopathy. No bone lesions. IMPRESSION: Patchy infiltrate right base. Lungs elsewhere clear. Stable cardiac silhouette. Electronically Signed   By: Lowella Grip III M.D.   On: 03/19/2016 10:33   Dg Chest 2 View  03/18/2016  CLINICAL DATA:  Cough.  Shortness of breath, and wheezing. EXAM: CHEST - 2 VIEW COMPARISON:  11/02/2013.  FINDINGS: Mild cardiac enlargement is present. The lung volumes are low. Right lower lobe airspace disease it is best seen on the lateral view compatible with pneumonia. Mild pulmonary vascular congestion is evident. A right pleural effusion is suspected. The visualized soft tissues and bony thorax are unremarkable. IMPRESSION: 1. Right lower lobe pneumonia. 2. Mild pulmonary vascular congestion probable right pleural effusion. Electronically Signed   By: San Morelle M.D.   On: 03/18/2016 12:39       Filed Weights   03/18/16 1034  Weight: 99.791 kg (220 lb)     Microbiology: Recent Results (from the past 240 hour(s))  Culture, blood (routine x 2) Call MD if unable to obtain prior to antibiotics being given     Status: None (Preliminary result)  Collection Time: 03/18/16  4:47 PM  Result Value Ref Range Status   Specimen Description BLOOD RIGHT ANTECUBITAL  Final   Special Requests   Final    BOTTLES DRAWN AEROBIC AND ANAEROBIC BLUE 10CC RED 5CC   Culture NO GROWTH 2 DAYS  Final   Report Status PENDING  Incomplete  Culture, blood (routine x 2) Call MD if unable to obtain prior to antibiotics being given     Status: None (Preliminary result)   Collection Time: 03/18/16  5:00 PM  Result Value Ref Range Status   Specimen Description BLOOD RIGHT HAND  Final   Special Requests BOTTLES DRAWN AEROBIC ONLY 10CC  Final   Culture NO GROWTH 2 DAYS  Final   Report Status PENDING  Incomplete  Urine culture     Status: None   Collection Time: 03/19/16  7:21 AM  Result Value Ref Range Status   Specimen Description URINE, CLEAN CATCH  Final   Special Requests NONE  Final   Culture NO GROWTH 1 DAY  Final   Report Status 03/20/2016 FINAL  Final  Culture, sputum-assessment     Status: None   Collection Time: 03/19/16 12:08 PM  Result Value Ref Range Status   Specimen Description SPUTUM  Final   Special Requests NONE  Final   Sputum evaluation   Final    MICROSCOPIC FINDINGS SUGGEST  THAT THIS SPECIMEN IS NOT REPRESENTATIVE OF LOWER RESPIRATORY SECRETIONS. PLEASE RECOLLECT. Gram Stain Report Called to,Read Back By and Verified With: K PRICE,RN AT 1330 03/19/16 BY L BENFIELD    Report Status 03/19/2016 FINAL  Final       Blood Culture    Component Value Date/Time   SDES SPUTUM 03/19/2016 1208   SPECREQUEST NONE 03/19/2016 1208   CULT NO GROWTH 1 DAY 03/19/2016 0721   REPTSTATUS 03/19/2016 FINAL 03/19/2016 1208      Labs: Results for orders placed or performed during the hospital encounter of 03/18/16 (from the past 48 hour(s))  Glucose, capillary     Status: Abnormal   Collection Time: 03/19/16  5:21 PM  Result Value Ref Range   Glucose-Capillary 128 (H) 65 - 99 mg/dL  Glucose, capillary     Status: Abnormal   Collection Time: 03/19/16 10:34 PM  Result Value Ref Range   Glucose-Capillary 147 (H) 65 - 99 mg/dL  Glucose, capillary     Status: Abnormal   Collection Time: 03/20/16  8:13 AM  Result Value Ref Range   Glucose-Capillary 125 (H) 65 - 99 mg/dL  Glucose, capillary     Status: Abnormal   Collection Time: 03/20/16 12:38 PM  Result Value Ref Range   Glucose-Capillary 183 (H) 65 - 99 mg/dL  Glucose, capillary     Status: Abnormal   Collection Time: 03/20/16  5:23 PM  Result Value Ref Range   Glucose-Capillary 117 (H) 65 - 99 mg/dL  Glucose, capillary     Status: Abnormal   Collection Time: 03/20/16  8:26 PM  Result Value Ref Range   Glucose-Capillary 192 (H) 65 - 99 mg/dL   Comment 1 Notify RN    Comment 2 Document in Chart   CBC     Status: Abnormal   Collection Time: 03/21/16  6:03 AM  Result Value Ref Range   WBC 15.4 (H) 4.0 - 10.5 K/uL   RBC 3.59 (L) 3.87 - 5.11 MIL/uL   Hemoglobin 10.4 (L) 12.0 - 15.0 g/dL   HCT 32.0 (L) 36.0 - 46.0 %   MCV  89.1 78.0 - 100.0 fL   MCH 29.0 26.0 - 34.0 pg   MCHC 32.5 30.0 - 36.0 g/dL   RDW 13.2 11.5 - 15.5 %   Platelets 231 150 - 400 K/uL  Basic metabolic panel     Status: Abnormal   Collection Time:  03/21/16  6:03 AM  Result Value Ref Range   Sodium 139 135 - 145 mmol/L   Potassium 4.0 3.5 - 5.1 mmol/L   Chloride 105 101 - 111 mmol/L   CO2 22 22 - 32 mmol/L   Glucose, Bld 99 65 - 99 mg/dL   BUN 23 (H) 6 - 20 mg/dL   Creatinine, Ser 1.40 (H) 0.44 - 1.00 mg/dL   Calcium 9.2 8.9 - 10.3 mg/dL   GFR calc non Af Amer 36 (L) >60 mL/min   GFR calc Af Amer 42 (L) >60 mL/min    Comment: (NOTE) The eGFR has been calculated using the CKD EPI equation. This calculation has not been validated in all clinical situations. eGFR's persistently <60 mL/min signify possible Chronic Kidney Disease.    Anion gap 12 5 - 15  Glucose, capillary     Status: None   Collection Time: 03/21/16  7:46 AM  Result Value Ref Range   Glucose-Capillary 88 65 - 99 mg/dL  Glucose, capillary     Status: Abnormal   Collection Time: 03/21/16 12:08 PM  Result Value Ref Range   Glucose-Capillary 136 (H) 65 - 99 mg/dL     Lipid Panel     Lab Results  Component Value Date   HGBA1C 5.7 11/30/2015   HGBA1C 5.9 06/03/2014   HGBA1C 5.8 11/20/2013        HPI : 75 y.o. female with medical history significant for HTN, hyperlipidemia, DM2 diet controlled, Asthma, chronic anemia and thrombocytopenia, presenting with 4 day history of increasing shortness of breath and increasing productive, green, thick cough, fevers up to 102, chills without night sweats, and right and central chest discomfort with coughing,. She took cough medicine without resolution of symptoms   HOSPITAL COURSE:  Acute hypoxic respiratory failure due to CAP in a patient with asthma. The patient is not on home oxygen. O2 sats on RA 96% . currently on oxygen . Flu screen negative. Right basilar infiltrate could be consistent with aspiration, order speech therapy consultation Switched to oral Levaquin, white count has increased overnight probably secondary to steroids --Steroid taper at discharge --Anti-tussives, mucolytics as needed  cont  levaquin for another 4 doses    Chronic kidney disease stage IIIB 30-44 baseline creatinine 1.4-1.5  - Minimize nephrotoxins and renally dose medications  Recent Labs    Lab Results  Component Value Date   CREATININE 1.50* 03/18/2016   CREATININE 1.42* 11/30/2015   CREATININE 1.7* 08/07/2014    Creatinine improved to baseline. Continue to monitor.    Hypothyroidism: Chronic.  -Continue home Synthroid  Thrombocytopenia, chronic Follow CBC, caution with heparin products  Hypertension  Add Hydralazine Q6 hours as needed for SBP >160 and /or DBP >110.  Continue atenolol,  DM2 diet controlled, with peripheral neuropathy not on oral antiglycemics Last A1C 5.4 in Jan 2017  SSI (patient's daughter advised that she should not need insulin at discharge) Continue to monitor while on steroids Continue Neurontin   Anemia of chronic disease. Hemoglobin 10.4 on admission  No bleeding issues, no indication for transfusion Continue oral iron Follow CBC  History of migraine headaches Tylenol as needed  Deconditioning stable  Discharge Exam:  Blood pressure 151/68, pulse 58, temperature 98.2 F (36.8 C), temperature source Oral, resp. rate 18, height _0  (1.6 m), weight 99.791 kg (220 lb), SpO2 97 %.  General exam: Appears calm and comfortable  Respiratory system: Ronchi resolved. No wheeze. Faint crackles remain on the right. Cardiovascular system: S1 & S2 heard, RRR. No pedal edema. Gastrointestinal system: Abdomen is nondistended, soft and nontender. Normal bowel sounds heard. Central nervous system: Alert and oriented. No focal neurological deficits. MSK: Moves all four extremities spontaneously Skin: Chronic stasis changes in bilateral lower extremities  Psychiatry: Judgement and insight appear normal. Mood & affect appropriate.     Follow-up Information    Follow up with Cathlean Cower, MD. Schedule  an appointment as soon as possible for a visit in 3 days.   Specialties:  Internal Medicine, Radiology   Contact information:   [place] [place] [address] [phone]       Signed: Reyne Dumas 03/21/2016, 2:41 PM        Time spent >45 mins

## 2016-03-21 NOTE — Progress Notes (Signed)
SATURATION QUALIFICATIONS: (This note is used to comply with regulatory documentation for home oxygen)  Patient Saturations on Room Air at Rest = 98%  Patient Saturations on Room Air while Ambulating = 93%  Pt does not qualify for home oxygen.

## 2016-03-21 NOTE — Progress Notes (Signed)
Chaplain will put Pt on the list to be seen by a priest.

## 2016-03-21 NOTE — Progress Notes (Signed)
PROGRESS NOTE    [name]  ZOX:[id number] DOB: Mar 20, 1941 DOA: 03/18/2016 PCP: Oliver Barre, MD Outpatient Specialists:   Brief Narrative: 75 yo woman with a history of HTN, HLD, DM, asthma, and chronic anemia and thrombocytopenia admitted for management of mild sepsis, present on admission, secondary to CAP (RLL).  Assessment/Plan Active Problems: CAP (community acquired pneumonia)  Hypothyroidism  Impaired glucose tolerance  OBESITY  THROMBOCYTOPENIA  Essential hypertension  CKD (chronic kidney disease)  CAP (community acquired pneumonia)  Headache    Acute hypoxic respiratory failure due to CAP in a patient with asthma.  The patient is not on home oxygen.  O2 sats on RA 96% .  Leave oxygen OFF now and monitor.   Flu screen negative. Right basilar infiltrate could be consistent with aspiration, order speech therapy consultation Switched to oral Levaquin, white count has increased overnight probably secondary to steroids --Steroid taper at discharge --Anti-tussives, mucolytics as needed --If clinical decline, low threshold for CT chest (this has been discussed with patient's son and daughter). Speech therapy consultation pending   Chronic kidney disease stage IIIB 30-44 baseline creatinine 1.4-1.5  - Minimize nephrotoxins and renally dose medications  Recent Labs    Lab Results  Component Value Date   CREATININE 1.50* 03/18/2016   CREATININE 1.42* 11/30/2015   CREATININE 1.7* 08/07/2014    Creatinine improved to baseline. Continue to monitor.    Hypothyroidism: Chronic.  -Continue home Synthroid  Thrombocytopenia, chronic Follow CBC, caution with heparin products  Hypertension  Add Hydralazine Q6 hours as needed for SBP >160 and /or DBP >110.  Continue atenolol,  DM2 diet controlled, with peripheral neuropathy not on oral antiglycemics Last A1C 5.4 in Jan 2017  SSI (patient's daughter advised that she should not need  insulin at discharge) Continue to monitor while on steroids Continue Neurontin   Anemia of chronic disease. Hemoglobin 10.4 on admission  No bleeding issues, no indication for transfusion Continue oral iron Follow CBC  History of migraine headaches Tylenol as needed  Deconditioning Consult PT/OT/Nutrition   DVT prophylaxis: Heparin Code Status: Full  Family Communication: Discussed with patient, daughter at bedside today Disposition Plan: Anticipate discharge tomorrow    Consults called: None Admission status: Inpatient     Consultants:   None  Procedures: None  Antimicrobials:   Rocephin/Azithromycin 5/5  Levaquin 5/6 -    Subjective: Afebrile overnight, currently on room air   Objective: Filed Vitals:   03/20/16 2012 03/20/16 2206 03/21/16 0727 03/21/16 0728  BP:  136/75 151/68   Pulse: 65 67 58   Temp:  98.3 F (36.8 C) 98.2 F (36.8 C)   TempSrc:  Oral    Resp: Height:      Weight:      SpO2: 96% 95% 97% 97%    Intake/Output Summary (Last 24 hours) at 03/21/16 1114 Last data filed at 03/20/16 2234  Gross per 24 hour  Intake      3 ml  Output      0 ml  Net      3 ml   Filed Weights   03/18/16 1034  Weight: 99.791 kg (220 lb)    Examination: General exam: Appears calm and comfortable  Respiratory system: Ronchi resolved.  No wheeze.  Faint crackles remain on the right. Cardiovascular system: S1 & S2 heard, RRR. No pedal edema. Gastrointestinal system: Abdomen is nondistended, soft and nontender. Normal bowel sounds heard. Central nervous system: Alert and oriented. No focal neurological  deficits. MSK: Moves all four extremities spontaneously Skin: Chronic stasis changes in bilateral lower extremities  Psychiatry: Judgement and insight appear normal. Mood & affect appropriate.   Data Reviewed: I have personally reviewed following labs and imaging studies  CBC:  Recent Labs Lab 03/18/16 1330 03/18/16 1655  03/19/16 0430 03/21/16 0603  WBC 10.7* 9.5 12.3* 15.4*  NEUTROABS 8.5*  --   --   --   HGB 10.4* 10.0* 9.5* 10.4*  HCT 31.7* 30.5* 29.1* 32.0*  MCV 88.8 87.6 88.7 89.1  PLT 104* 109* 124* 231   Basic Metabolic Panel:  Recent Labs Lab 03/18/16 1330 03/18/16 1655 03/19/16 0430 03/21/16 0603  NA 142  --  140 139  K 4.0  --  4.7 4.0  CL 106  --  104 105  CO2 24  --  24 22  GLUCOSE 144*  --  132* 99  BUN 14  --  16 23*  CREATININE 1.50* 1.54* 1.29* 1.40*  CALCIUM 9.3  --  8.8* 9.2   GFR: Estimated Creatinine Clearance: 39.7 mL/min (by C-G formula based on Cr of 1.4). Liver Function Tests:  Recent Labs Lab 03/19/16 0430  AST 20  ALT 14  ALKPHOS 40  BILITOT 0.7  PROT 6.9  ALBUMIN 3.1*   CBG:  Recent Labs Lab 03/20/16 0813 03/20/16 1238 03/20/16 1723 03/20/16 2026 03/21/16 0746  GLUCAP 125* 183* 117* 192* 88   Urine analysis:    Component Value Date/Time   COLORURINE YELLOW 11/30/2015 1105   APPEARANCEUR CLEAR 11/30/2015 1105   LABSPEC 1.010 11/30/2015 1105   PHURINE 6.0 11/30/2015 1105   GLUCOSEU NEGATIVE 11/30/2015 1105   GLUCOSEU NEGATIVE 06/21/2010 1100   HGBUR TRACE-INTACT* 11/30/2015 1105   BILIRUBINUR NEGATIVE 11/30/2015 1105   KETONESUR NEGATIVE 11/30/2015 1105   PROTEINUR NEGATIVE 06/21/2010 1100   UROBILINOGEN 1.0 11/30/2015 1105   NITRITE NEGATIVE 11/30/2015 1105   LEUKOCYTESUR TRACE* 11/30/2015 1105    Radiology Studies: No results found.      Scheduled Meds: . atenolol  50 mg Oral Daily  . docusate sodium  100 mg Oral BID  . ferrous sulfate  325 mg Oral Daily  . gabapentin  100 mg Oral QHS  . guaiFENesin  600 mg Oral BID  . heparin  5,000 Units Subcutaneous Q8H  . insulin aspart  0-9 Units Subcutaneous TID WC  . ipratropium-albuterol  3 mL Nebulization TID  . levofloxacin (LEVAQUIN) IV  750 mg Intravenous Q48H  . levothyroxine  88 mcg Oral QAC breakfast  . predniSONE  40 mg Oral Q breakfast  . psyllium  1 packet Oral Daily    . sodium chloride flush  3 mL Intravenous Q12H   Continuous Infusions:       Time spent: 25 minutes    Richarda OverlieABROL,Jarone Ostergaard, MD [facility]  03/21/2016, 11:14 AM

## 2016-03-22 ENCOUNTER — Telehealth: Payer: Self-pay | Admitting: *Deleted

## 2016-03-22 NOTE — Telephone Encounter (Signed)
Pt was on TCM list tried calling to set=up hosp f/u no answer LMOM RTC...Raechel Chute/LMB

## 2016-03-23 LAB — CULTURE, BLOOD (ROUTINE X 2)
CULTURE: NO GROWTH
Culture: NO GROWTH

## 2016-03-23 NOTE — Telephone Encounter (Signed)
Transition Care Management Follow-up Telephone Call   Date discharged? 03/21/16   How have you been since you were released from the hospital? Pt states she is doing ok   Do you understand why you were in the hospital? YES   Do you understand the discharge instructions? YES   Where were you discharged to? Home   Items Reviewed:  Medications reviewed: YES  Allergies reviewed: YES  Dietary changes reviewed: YES, low sodium & heart healthy   Referrals reviewed: No referral needed   Functional Questionnaire:   Activities of Daily Living (ADLs):   She states she are independent in the following: ambulation, bathing and hygiene, feeding, continence, grooming, toileting and dressing States she doesn't require assistance    Any transportation issues/concerns?: YES   Any patient concerns? NO   Confirmed importance and date/time of follow-up visits scheduled YES, appt 03/29/16  Provider Appointment booked with Dr. Jonny RuizJohn  Confirmed with patient if condition begins to worsen call PCP or go to the ER.  Patient was given the office number and encouraged to call back with question or concerns.  : YES

## 2016-03-25 ENCOUNTER — Ambulatory Visit: Payer: Medicare Other | Admitting: Family Medicine

## 2016-03-29 ENCOUNTER — Ambulatory Visit (INDEPENDENT_AMBULATORY_CARE_PROVIDER_SITE_OTHER): Payer: Medicare Other | Admitting: Family Medicine

## 2016-03-29 ENCOUNTER — Ambulatory Visit (INDEPENDENT_AMBULATORY_CARE_PROVIDER_SITE_OTHER): Payer: Medicare Other | Admitting: Internal Medicine

## 2016-03-29 ENCOUNTER — Encounter: Payer: Self-pay | Admitting: Internal Medicine

## 2016-03-29 ENCOUNTER — Encounter: Payer: Self-pay | Admitting: Family Medicine

## 2016-03-29 VITALS — BP 126/72 | HR 70 | Ht 65.0 in | Wt 226.0 lb

## 2016-03-29 VITALS — BP 132/82 | HR 77 | Temp 98.4°F | Resp 20 | Wt 227.0 lb

## 2016-03-29 DIAGNOSIS — M5416 Radiculopathy, lumbar region: Secondary | ICD-10-CM

## 2016-03-29 DIAGNOSIS — R5382 Chronic fatigue, unspecified: Secondary | ICD-10-CM | POA: Diagnosis not present

## 2016-03-29 DIAGNOSIS — J9601 Acute respiratory failure with hypoxia: Secondary | ICD-10-CM | POA: Diagnosis not present

## 2016-03-29 DIAGNOSIS — J45901 Unspecified asthma with (acute) exacerbation: Secondary | ICD-10-CM

## 2016-03-29 DIAGNOSIS — Z09 Encounter for follow-up examination after completed treatment for conditions other than malignant neoplasm: Secondary | ICD-10-CM | POA: Diagnosis not present

## 2016-03-29 DIAGNOSIS — J189 Pneumonia, unspecified organism: Secondary | ICD-10-CM | POA: Diagnosis not present

## 2016-03-29 MED ORDER — FLUTICASONE-SALMETEROL 250-50 MCG/DOSE IN AEPB: 1.0000 | INHALATION_SPRAY | Freq: Two times a day (BID) | RESPIRATORY_TRACT | Status: DC

## 2016-03-29 MED ORDER — HYDROCODONE-HOMATROPINE 5-1.5 MG/5ML PO SYRP: 5.0000 mL | ORAL_SOLUTION | Freq: Four times a day (QID) | ORAL | Status: DC | PRN

## 2016-03-29 MED ORDER — ALBUTEROL SULFATE HFA 108 (90 BASE) MCG/ACT IN AERS: 2.0000 | INHALATION_SPRAY | Freq: Four times a day (QID) | RESPIRATORY_TRACT | Status: DC | PRN

## 2016-03-29 NOTE — Patient Instructions (Signed)
Good to see you  Ice is your friend Gabapentin 100mg  nightly may be better then intermittent.  We will get you a sleep study as well  [facility] the [facility]  [phone] See me again 2 weeks after epidural and we will make sure you are doing well.
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
## 2016-03-29 NOTE — Progress Notes (Signed)
Pre visit review using our clinic review tool, if applicable. No additional management support is needed unless otherwise documented below in the visit note. 

## 2016-03-29 NOTE — Patient Instructions (Signed)
Please take all new medication as prescribed - the cough medicine, and the advair  You can also take Mucinex (or it's generic off brand) for congestion, and tylenol as needed for pain.  Please continue all other medications as before, and refills have been done if requested - the albuterol inhaler  Please have the pharmacy call with any other refills you may need.  Please keep your appointments with your specialists as you may have planned

## 2016-03-29 NOTE — Assessment & Plan Note (Signed)
Patient does have some 15. Likely some of it is post pneumonia and after being hospitalized. Patient states that she is not feeling rested after sleep. I do feel that a sleep study could be warranted with patient's other comorbidities. Patient will have this ordered. We'll follow-up we will discuss further treatment afterwards.
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
## 2016-03-29 NOTE — Assessment & Plan Note (Addendum)
Clinically resolved, no need for further cxr or tx at this time, ok for cough med prn due to ongoing cough and stress incontinence

## 2016-03-29 NOTE — Assessment & Plan Note (Signed)
Worsening symptoms at this time. Has responded to epidurals previously and I would like her to have this. In order has been put in already. Started on gabapentin low dose that I think will be beneficial and warned of potential side effects. We discussed icing regimen. Encourage her to continue to stay active. Patient will follow-up with me again in 2 weeks after the injection for further evaluation.  Spent  25 minutes with patient face-to-face and had greater than 50% of counseling including as described above in assessment and plan.

## 2016-03-29 NOTE — Assessment & Plan Note (Addendum)
Resolved, Clinically resolved, no need for further cxr or tx at this time such as steroid tx,  to f/u any worsening symptoms or concerns, also for mucinex otc prn

## 2016-03-29 NOTE — Assessment & Plan Note (Signed)
Clinically resolved, no need for further cxr or tx at this time,  SpO2 Readings from Last 3 Encounters:  03/29/16 97%  03/29/16 97%  03/21/16 99%

## 2016-03-30 ENCOUNTER — Telehealth: Payer: Self-pay | Admitting: Internal Medicine

## 2016-03-30 NOTE — Telephone Encounter (Signed)
Called patient, unable to reach

## 2016-03-30 NOTE — Telephone Encounter (Signed)
Pt called in and wanted to talk to nurse about her feet swelling and that she went to pick up her meds that were called in and they were over $200.00 and she can not afford that.  Can you call her when you get a chance?

## 2016-04-07 ENCOUNTER — Encounter: Payer: Self-pay | Admitting: Internal Medicine

## 2016-04-07 ENCOUNTER — Other Ambulatory Visit (INDEPENDENT_AMBULATORY_CARE_PROVIDER_SITE_OTHER): Payer: Medicare Other

## 2016-04-07 ENCOUNTER — Ambulatory Visit (INDEPENDENT_AMBULATORY_CARE_PROVIDER_SITE_OTHER): Payer: Medicare Other | Admitting: Internal Medicine

## 2016-04-07 ENCOUNTER — Ambulatory Visit (INDEPENDENT_AMBULATORY_CARE_PROVIDER_SITE_OTHER)
Admission: RE | Admit: 2016-04-07 | Discharge: 2016-04-07 | Disposition: A | Discharge status: Home / Self Care | Payer: Medicare Other | Source: Ambulatory Visit | Attending: Internal Medicine | Admitting: Internal Medicine

## 2016-04-07 VITALS — BP 136/78 | HR 62 | Temp 98.1°F | Resp 20 | Wt 236.0 lb

## 2016-04-07 DIAGNOSIS — E538 Deficiency of other specified B group vitamins: Secondary | ICD-10-CM

## 2016-04-07 DIAGNOSIS — J189 Pneumonia, unspecified organism: Secondary | ICD-10-CM | POA: Diagnosis not present

## 2016-04-07 DIAGNOSIS — D649 Anemia, unspecified: Secondary | ICD-10-CM

## 2016-04-07 DIAGNOSIS — I5031 Acute diastolic (congestive) heart failure: Secondary | ICD-10-CM

## 2016-04-07 DIAGNOSIS — N183 Chronic kidney disease, stage 3 (moderate): Secondary | ICD-10-CM

## 2016-04-07 DIAGNOSIS — R609 Edema, unspecified: Secondary | ICD-10-CM

## 2016-04-07 DIAGNOSIS — J4520 Mild intermittent asthma, uncomplicated: Secondary | ICD-10-CM

## 2016-04-07 DIAGNOSIS — I5032 Chronic diastolic (congestive) heart failure: Secondary | ICD-10-CM

## 2016-04-07 HISTORY — DX: Chronic diastolic (congestive) heart failure: I50.32

## 2016-04-07 LAB — IBC PANEL
Iron: 75 ug/dL (ref 42–145)
SATURATION RATIOS: 23.4 % (ref 20.0–50.0)
TRANSFERRIN: 229 mg/dL (ref 212.0–360.0)

## 2016-04-07 LAB — CBC WITH DIFFERENTIAL/PLATELET
Basophils Absolute: 0 10*3/uL (ref 0.0–0.1)
Basophils Relative: 0.2 % (ref 0.0–3.0)
EOS PCT: 1.6 % (ref 0.0–5.0)
Eosinophils Absolute: 0.1 10*3/uL (ref 0.0–0.7)
HCT: 31.3 % — ABNORMAL LOW (ref 36.0–46.0)
HEMOGLOBIN: 10.5 g/dL — AB (ref 12.0–15.0)
LYMPHS PCT: 31 % (ref 12.0–46.0)
Lymphs Abs: 1.4 10*3/uL (ref 0.7–4.0)
MCHC: 33.4 g/dL (ref 30.0–36.0)
MCV: 88.7 fl (ref 78.0–100.0)
MONO ABS: 0.5 10*3/uL (ref 0.1–1.0)
MONOS PCT: 11.3 % (ref 3.0–12.0)
Neutro Abs: 2.5 10*3/uL (ref 1.4–7.7)
Neutrophils Relative %: 55.9 % (ref 43.0–77.0)
Platelets: 80 10*3/uL — ABNORMAL LOW (ref 150.0–400.0)
RBC: 3.53 Mil/uL — AB (ref 3.87–5.11)
RDW: 14.1 % (ref 11.5–15.5)
WBC: 4.4 10*3/uL (ref 4.0–10.5)

## 2016-04-07 LAB — BASIC METABOLIC PANEL
BUN: 16 mg/dL (ref 6–23)
CALCIUM: 9.2 mg/dL (ref 8.4–10.5)
CO2: 28 mEq/L (ref 19–32)
CREATININE: 1.36 mg/dL — AB (ref 0.40–1.20)
Chloride: 110 mEq/L (ref 96–112)
GFR: 48.8 mL/min — AB (ref >60.00)
GLUCOSE: 80 mg/dL (ref 70–99)
Potassium: 4.2 mEq/L (ref 3.5–5.1)
Sodium: 142 mEq/L (ref 135–145)

## 2016-04-07 LAB — HEPATIC FUNCTION PANEL
ALBUMIN: 3.8 g/dL (ref 3.5–5.2)
ALT: 11 U/L (ref 0–35)
AST: 15 U/L (ref 0–37)
Alkaline Phosphatase: 52 U/L (ref 39–117)
BILIRUBIN TOTAL: 0.4 mg/dL (ref 0.2–1.2)
Bilirubin, Direct: 0.2 mg/dL (ref 0.0–0.3)
Total Protein: 6.9 g/dL (ref 6.0–8.3)

## 2016-04-07 LAB — TSH: TSH: 0.66 u[IU]/mL (ref 0.35–4.50)

## 2016-04-07 LAB — VITAMIN B12: Vitamin B-12: 229 pg/mL (ref 211–911)

## 2016-04-07 MED ORDER — POTASSIUM CHLORIDE ER 10 MEQ PO TBCR: EXTENDED_RELEASE_TABLET | ORAL | Status: DC

## 2016-04-07 MED ORDER — FUROSEMIDE 40 MG PO TABS: ORAL_TABLET | ORAL | Status: DC

## 2016-04-07 NOTE — Progress Notes (Signed)
Pre visit review using our clinic review tool, if applicable. No additional management support is needed unless otherwise documented below in the visit note. 

## 2016-04-07 NOTE — Patient Instructions (Addendum)
Please take all new medication as prescribed - the lasix and potassium every day for persistent swelling  Please continue all other medications as before, and refills have been done if requested.  Please have the pharmacy call with any other refills you may need.  We will have Ferdie PingDahlia try to help with the cost of your inhalers  Please continue your efforts at being more active, low cholesterol diet, and weight control.  Please keep your appointments with your specialists as you may have planned  Please go to the XRAY Department in the Basement (go straight as you get off the elevator) for the x-ray testing  You will be contacted regarding the referral for: Echocardiogram  Please go to the LAB in the Basement (turn left off the elevator) for the tests to be done today  You will be contacted by phone if any changes need to be made immediately.  Otherwise, you will receive a letter about your results with an explanation, but please check with MyChart first.  Please remember to sign up for MyChart if you have not done so, as this will be important to you in the future with finding out test results, communicating by private email, and scheduling acute appointments online when needed.  Please return in 3 weeks, or sooner if needed

## 2016-04-07 NOTE — Assessment & Plan Note (Addendum)
Etiology unclear, cant r/o diast CHF, pulm HTN, worsening CKD or anemia; doubt thyroid or hepatic related, for echo, and lab f/u today, as well as cxr; start lasix 40 qd/kdur 10 qd, daily AM wts at home, and ROV 3 wks, consider card referral  Note:  Total time for pt hx, exam, review of record with pt in the room, determination of diagnoses and plan for further eval and tx is > 40 min, with over 50% spent in coordination and counseling of patient
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
## 2016-04-10 NOTE — Assessment & Plan Note (Signed)
Also for b12 shot IM as she is due

## 2016-04-10 NOTE — Assessment & Plan Note (Signed)
Resolved, no need further tx, pt reassured,  to f/u any worsening symptoms or concerns

## 2016-04-10 NOTE — Assessment & Plan Note (Signed)
stable overall by history and exam, recent data reviewed with pt, and pt to continue medical treatment as before,  to f/u any worsening symptoms or concerns Lab Results  Component Value Date   WBC 4.4 04/07/2016   HGB 10.5* 04/07/2016   HCT 31.3* 04/07/2016   MCV 88.7 04/07/2016   PLT 80.0* 04/07/2016

## 2016-04-10 NOTE — Assessment & Plan Note (Signed)
stable overall by history and exam, recent data reviewed with pt, and pt to continue medical treatment as before,  to f/u any worsening symptoms or concerns SpO2 Readings from Last 3 Encounters:  04/07/16 96%  03/29/16 97%  03/29/16 97%

## 2016-04-10 NOTE — Assessment & Plan Note (Signed)
stable overall by history and exam, recent data reviewed with pt, and pt to continue medical treatment as before,  to f/u any worsening symptoms or concerns Lab Results  Component Value Date   CREATININE 1.36* 04/07/2016

## 2016-04-26 ENCOUNTER — Other Ambulatory Visit: Payer: Self-pay

## 2016-04-26 ENCOUNTER — Encounter: Payer: Self-pay | Admitting: Internal Medicine

## 2016-04-26 ENCOUNTER — Ambulatory Visit (HOSPITAL_COMMUNITY): Payer: Medicare Other | Attending: Internal Medicine

## 2016-04-26 DIAGNOSIS — R609 Edema, unspecified: Secondary | ICD-10-CM | POA: Diagnosis not present

## 2016-04-26 DIAGNOSIS — E119 Type 2 diabetes mellitus without complications: Secondary | ICD-10-CM | POA: Diagnosis not present

## 2016-04-26 DIAGNOSIS — I071 Rheumatic tricuspid insufficiency: Secondary | ICD-10-CM | POA: Insufficient documentation

## 2016-04-26 DIAGNOSIS — E669 Obesity, unspecified: Secondary | ICD-10-CM | POA: Diagnosis not present

## 2016-04-26 DIAGNOSIS — I340 Nonrheumatic mitral (valve) insufficiency: Secondary | ICD-10-CM | POA: Diagnosis not present

## 2016-04-26 DIAGNOSIS — E785 Hyperlipidemia, unspecified: Secondary | ICD-10-CM | POA: Diagnosis not present

## 2016-04-26 DIAGNOSIS — I5031 Acute diastolic (congestive) heart failure: Secondary | ICD-10-CM | POA: Diagnosis not present

## 2016-04-26 DIAGNOSIS — Z6839 Body mass index (BMI) 39.0-39.9, adult: Secondary | ICD-10-CM | POA: Diagnosis not present

## 2016-04-26 DIAGNOSIS — I110 Hypertensive heart disease with heart failure: Secondary | ICD-10-CM | POA: Diagnosis not present

## 2016-04-26 LAB — ECHOCARDIOGRAM COMPLETE
AOASC: 31 cm
AVLVOTPG: 4 mmHg
CHL CUP DOP CALC LVOT VTI: 23.2 cm
CHL CUP TV REG PEAK VELOCITY: 230 cm/s
EERAT: 10.26
EWDT: 320 ms
FS: 36 % (ref 28–44)
IV/PV OW: 1.14
LA diam end sys: 33 mm
LA vol A4C: 53 ml
LA vol: 65 mL
LADIAMINDEX: 1.46 cm/m2
LASIZE: 33 mm
LAVOLIN: 28.7 mL/m2
LV E/e' medial: 10.26
LV PW d: 9.52 mm — AB (ref 0.6–1.1)
LV e' LATERAL: 7.31 cm/s
LVEEAVG: 10.26
LVOT SV: 59 mL
LVOT area: 2.54 cm2
LVOT diameter: 18 mm
LVOT peak vel: 100 cm/s
Lateral S' vel: 12 cm/s
MV Dec: 320
MV Peak grad: 2 mmHg
MVPKAVEL: 73.1 m/s
MVPKEVEL: 75 m/s
RV sys press: 24 mmHg
TDI e' lateral: 7.31
TDI e' medial: 5.65
TR max vel: 230 cm/s

## 2016-04-27 ENCOUNTER — Ambulatory Visit (INDEPENDENT_AMBULATORY_CARE_PROVIDER_SITE_OTHER): Payer: Medicare Other | Admitting: Internal Medicine

## 2016-04-27 ENCOUNTER — Encounter: Payer: Self-pay | Admitting: Internal Medicine

## 2016-04-27 ENCOUNTER — Other Ambulatory Visit (INDEPENDENT_AMBULATORY_CARE_PROVIDER_SITE_OTHER): Payer: Medicare Other

## 2016-04-27 ENCOUNTER — Other Ambulatory Visit: Payer: Medicare Other

## 2016-04-27 ENCOUNTER — Ambulatory Visit (INDEPENDENT_AMBULATORY_CARE_PROVIDER_SITE_OTHER)
Admission: RE | Admit: 2016-04-27 | Discharge: 2016-04-27 | Disposition: A | Discharge status: Home / Self Care | Payer: Medicare Other | Source: Ambulatory Visit | Attending: Internal Medicine | Admitting: Internal Medicine

## 2016-04-27 VITALS — BP 132/76 | HR 80 | Temp 98.2°F | Resp 20 | Wt 229.0 lb

## 2016-04-27 DIAGNOSIS — E538 Deficiency of other specified B group vitamins: Secondary | ICD-10-CM

## 2016-04-27 DIAGNOSIS — D509 Iron deficiency anemia, unspecified: Secondary | ICD-10-CM

## 2016-04-27 DIAGNOSIS — I5032 Chronic diastolic (congestive) heart failure: Secondary | ICD-10-CM

## 2016-04-27 DIAGNOSIS — D696 Thrombocytopenia, unspecified: Secondary | ICD-10-CM | POA: Diagnosis not present

## 2016-04-27 DIAGNOSIS — J189 Pneumonia, unspecified organism: Secondary | ICD-10-CM

## 2016-04-27 DIAGNOSIS — D638 Anemia in other chronic diseases classified elsewhere: Secondary | ICD-10-CM | POA: Diagnosis not present

## 2016-04-27 DIAGNOSIS — M25571 Pain in right ankle and joints of right foot: Secondary | ICD-10-CM

## 2016-04-27 DIAGNOSIS — N183 Chronic kidney disease, stage 3 (moderate): Secondary | ICD-10-CM | POA: Diagnosis not present

## 2016-04-27 DIAGNOSIS — R600 Localized edema: Secondary | ICD-10-CM | POA: Diagnosis not present

## 2016-04-27 DIAGNOSIS — I10 Essential (primary) hypertension: Secondary | ICD-10-CM

## 2016-04-27 LAB — CBC WITH DIFFERENTIAL/PLATELET
BASOS ABS: 0 10*3/uL (ref 0.0–0.1)
Basophils Relative: 0.3 % (ref 0.0–3.0)
EOS ABS: 0 10*3/uL (ref 0.0–0.7)
Eosinophils Relative: 0.8 % (ref 0.0–5.0)
HEMATOCRIT: 32.9 % — AB (ref 36.0–46.0)
HEMOGLOBIN: 10.9 g/dL — AB (ref 12.0–15.0)
LYMPHS ABS: 1.5 10*3/uL (ref 0.7–4.0)
Lymphocytes Relative: 31.4 % (ref 12.0–46.0)
MCHC: 33.1 g/dL (ref 30.0–36.0)
MCV: 88.4 fl (ref 78.0–100.0)
MONO ABS: 0.6 10*3/uL (ref 0.1–1.0)
MONOS PCT: 12.9 % — AB (ref 3.0–12.0)
NEUTROS ABS: 2.6 10*3/uL (ref 1.4–7.7)
Neutrophils Relative %: 54.6 % (ref 43.0–77.0)
PLATELETS: 119 10*3/uL — AB (ref 150.0–400.0)
RBC: 3.72 Mil/uL — AB (ref 3.87–5.11)
RDW: 12.9 % (ref 11.5–15.5)
WBC: 4.8 10*3/uL (ref 4.0–10.5)

## 2016-04-27 LAB — BASIC METABOLIC PANEL
BUN: 13 mg/dL (ref 6–23)
CHLORIDE: 107 meq/L (ref 96–112)
CO2: 29 mEq/L (ref 19–32)
Calcium: 9.3 mg/dL (ref 8.4–10.5)
Creatinine, Ser: 1.3 mg/dL — ABNORMAL HIGH (ref 0.40–1.20)
GFR: 51.41 mL/min — ABNORMAL LOW (ref >60.00)
GLUCOSE: 100 mg/dL — AB (ref 70–99)
POTASSIUM: 4.2 meq/L (ref 3.5–5.1)
Sodium: 141 mEq/L (ref 135–145)
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
## 2016-04-27 NOTE — Patient Instructions (Signed)
Ok to stop the oral iron pills  OK to continue the B12 pills  Please continue all other medications as before, and refills have been done if requested.  Please have the pharmacy call with any other refills you may need.  Please continue your efforts at being more active, low cholesterol diet, and weight control.  You are otherwise up to date with prevention measures today.  Please keep your appointments with your specialists as you may have planned  Please go to the XRAY Department in the Basement (go straight as you get off the elevator) for the x-ray testing - for the right ankle, and the Chest xray  Please see Dr Katrinka BlazingSmith about the right ankle as well, as I suspect arthritis there as well  Please go to the LAB in the Basement (turn left off the elevator) for the tests to be done today  You will be contacted by phone if any changes need to be made immediately.  Otherwise, you will receive a letter about your results with an explanation, but please check with MyChart first.  Please remember to sign up for MyChart if you have not done so, as this will be important to you in the future with finding out test results, communicating by private email, and scheduling acute appointments online when needed.  Please return in 6 months, or sooner if needed

## 2016-04-27 NOTE — Progress Notes (Signed)
Pre visit review using our clinic review tool, if applicable. No additional management support is needed unless otherwise documented below in the visit note. 

## 2016-04-27 NOTE — Assessment & Plan Note (Signed)
Resolved, ok to stop oral iron

## 2016-05-02 NOTE — Assessment & Plan Note (Signed)
stable overall by history and exam, recent data reviewed with pt, and pt to continue medical treatment as before,  to f/u any worsening symptoms or concerns Lab Results  Component Value Date   CREATININE 1.30* 04/27/2016

## 2016-05-02 NOTE — Assessment & Plan Note (Addendum)
Improved, slight to mild swelling only, wt decreased, for BMP  Note:  Total time for pt hx, exam, review of record with pt in the room, determination of diagnoses and plan for further eval and tx is > 40 min, with over 50% spent in coordination and counseling of patient

## 2016-05-02 NOTE — Assessment & Plan Note (Signed)
O/w stable overall by history and exam, recent data reviewed with pt, and pt to continue medical treatment as before,  to f/u any worsening symptoms or concerns Lab Results  Component Value Date   WBC 4.8 04/27/2016   HGB 10.9* 04/27/2016   HCT 32.9* 04/27/2016   MCV 88.4 04/27/2016   PLT 119.0* 04/27/2016

## 2016-05-02 NOTE — Assessment & Plan Note (Signed)
?   [facility] vs other, for film, also refer Dr Smith/sport med in this office

## 2016-05-02 NOTE — Assessment & Plan Note (Signed)
stable overall by history and exam, recent data reviewed with pt, and pt to continue medical treatment as before,  to f/u any worsening symptoms or concerns BP Readings from Last 3 Encounters:  04/27/16 132/76  04/07/16 136/78  03/29/16 132/82

## 2016-05-02 NOTE — Assessment & Plan Note (Signed)
?   Spurious value, for f.u plt level today

## 2016-05-02 NOTE — Assessment & Plan Note (Signed)
Symptoms improved, for xray,  to f/u any worsening symptoms or concerns

## 2016-05-02 NOTE — Assessment & Plan Note (Signed)
Ok to change b12 to po,  to f/u any worsening symptoms or concerns

## 2016-05-02 NOTE — Assessment & Plan Note (Signed)
stable overall by history and exam, recent data reviewed with pt, and pt to continue medical treatment as before,  to f/u any worsening symptoms or concerns Lab Results  Component Value Date   TSH 0.66 04/07/2016

## 2016-05-25 ENCOUNTER — Encounter: Payer: Self-pay | Admitting: Internal Medicine

## 2016-05-25 ENCOUNTER — Ambulatory Visit (INDEPENDENT_AMBULATORY_CARE_PROVIDER_SITE_OTHER): Payer: Medicare Other | Admitting: Internal Medicine

## 2016-05-25 VITALS — BP 124/72 | HR 72 | Temp 98.0°F | Resp 20 | Wt 230.0 lb

## 2016-05-25 DIAGNOSIS — L732 Hidradenitis suppurativa: Secondary | ICD-10-CM | POA: Diagnosis not present

## 2016-05-25 DIAGNOSIS — I10 Essential (primary) hypertension: Secondary | ICD-10-CM

## 2016-05-25 DIAGNOSIS — R109 Unspecified abdominal pain: Secondary | ICD-10-CM

## 2016-05-25 DIAGNOSIS — R10A1 Flank pain, right side: Secondary | ICD-10-CM | POA: Insufficient documentation

## 2016-05-25 MED ORDER — TIZANIDINE HCL 4 MG PO TABS: 4.0000 mg | ORAL_TABLET | Freq: Four times a day (QID) | ORAL | Status: DC | PRN

## 2016-05-25 MED ORDER — DOXYCYCLINE HYCLATE 100 MG PO TABS: 100.0000 mg | ORAL_TABLET | Freq: Two times a day (BID) | ORAL | Status: DC

## 2016-05-25 MED ORDER — GABAPENTIN 100 MG PO CAPS: 100.0000 mg | ORAL_CAPSULE | Freq: Three times a day (TID) | ORAL | Status: DC

## 2016-05-25 NOTE — Patient Instructions (Signed)
Please take all new medication as prescribed - the muscle relaxer and antibiotic  OK to increase the gabapentin to 100 mg three times per day  Please continue all other medications as before, and refills have been done if requested.  Please have the pharmacy call with any other refills you may need.  Please keep your appointments with your specialists as you may have planned

## 2016-05-25 NOTE — Progress Notes (Signed)
Pre visit review using our clinic review tool, if applicable. No additional management support is needed unless otherwise documented below in the visit note. 
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
## 2016-05-31 ENCOUNTER — Ambulatory Visit: Payer: Medicare Other | Admitting: Internal Medicine

## 2016-06-01 DIAGNOSIS — L732 Hidradenitis suppurativa: Secondary | ICD-10-CM | POA: Insufficient documentation

## 2016-06-01 NOTE — Assessment & Plan Note (Signed)
Very low suspicion for other than MSK strain, no neuro change on exam for muscle relaxer and increased gabapentin asd trial,  to f/u any worsening symptoms or concerns

## 2016-06-01 NOTE — Assessment & Plan Note (Signed)
Mild to mod, for antibx course,  to f/u any worsening symptoms or concerns 

## 2016-06-02 NOTE — Assessment & Plan Note (Signed)
stable overall by history and exam, recent data reviewed with pt, and pt to continue medical treatment as before,  to f/u any worsening symptoms or concerns  

## 2016-06-22 ENCOUNTER — Telehealth: Payer: Self-pay

## 2016-06-22 NOTE — Telephone Encounter (Signed)
Unable to reach patient, left message to give us a call back regarding letter for TB results.

## 2016-06-22 NOTE — Telephone Encounter (Signed)
Patient called and wanted to know if we could mail her TB results to her for a job interview! Please follow up, Thank you.

## 2016-06-27 NOTE — Telephone Encounter (Signed)
Patient has called back in regard.  Please follow up with patient at [phone]

## 2016-07-28 ENCOUNTER — Encounter: Payer: Self-pay | Admitting: Internal Medicine

## 2016-07-28 ENCOUNTER — Ambulatory Visit (INDEPENDENT_AMBULATORY_CARE_PROVIDER_SITE_OTHER): Payer: Medicare Other | Admitting: Internal Medicine

## 2016-07-28 VITALS — BP 130/72 | HR 81 | Temp 98.2°F | Resp 20 | Wt 224.0 lb

## 2016-07-28 DIAGNOSIS — D509 Iron deficiency anemia, unspecified: Secondary | ICD-10-CM

## 2016-07-28 DIAGNOSIS — Z0001 Encounter for general adult medical examination with abnormal findings: Secondary | ICD-10-CM

## 2016-07-28 DIAGNOSIS — Z23 Encounter for immunization: Secondary | ICD-10-CM | POA: Diagnosis not present

## 2016-07-28 DIAGNOSIS — E538 Deficiency of other specified B group vitamins: Secondary | ICD-10-CM

## 2016-07-28 DIAGNOSIS — R6889 Other general symptoms and signs: Secondary | ICD-10-CM

## 2016-07-28 DIAGNOSIS — Z0000 Encounter for general adult medical examination without abnormal findings: Secondary | ICD-10-CM

## 2016-07-28 DIAGNOSIS — R739 Hyperglycemia, unspecified: Secondary | ICD-10-CM
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
## 2016-07-28 NOTE — Assessment & Plan Note (Signed)
stable overall by history and exam, recent data reviewed with pt, and pt to continue medical treatment as before,  to f/u any worsening symptoms or concerns, for f/u iron

## 2016-07-28 NOTE — Assessment & Plan Note (Signed)
stable overall by history and exam, recent data reviewed with pt, and pt to continue medical treatment as before,  to f/u any worsening symptoms or concerns Lab Results  Component Value Date   CREATININE 1.30 (H) 04/27/2016   For f/u lab

## 2016-07-28 NOTE — Assessment & Plan Note (Signed)
Ok for f/u today, to cont po b12 if ok level, o/w will need to restart b12 shots

## 2016-07-28 NOTE — Assessment & Plan Note (Signed)
stable overall by history and exam, recent data reviewed with pt, and pt to continue medical treatment as before,  to f/u any worsening symptoms or concerns Lab Results  Component Value Date   HGBA1C 5.7 11/30/2015

## 2016-07-28 NOTE — Patient Instructions (Addendum)
You had the Tdap tetanus shot today  You had the flu shot today  Please continue all other medications as before, and refills have been done if requested.  Please have the pharmacy call with any other refills you may need.  Please continue your efforts at being more active, low cholesterol diet, and weight control.  Please keep your appointments with your specialists as you may have planned - Renal in Jan 2018  Your form was filled out today  Please return in 6 months, or sooner if needed, with Lab testing done 3-5 days before

## 2016-07-28 NOTE — Progress Notes (Signed)
Pre visit review using our clinic review tool, if applicable. No additional management support is needed unless otherwise documented below in the visit note. 

## 2016-09-28 ENCOUNTER — Encounter: Payer: Self-pay | Admitting: Internal Medicine

## 2016-09-28 ENCOUNTER — Ambulatory Visit (INDEPENDENT_AMBULATORY_CARE_PROVIDER_SITE_OTHER): Payer: Medicare Other | Admitting: Internal Medicine

## 2016-09-28 VITALS — BP 130/70 | HR 67 | Temp 98.6°F | Resp 20 | Wt 220.0 lb

## 2016-09-28 DIAGNOSIS — R05 Cough: Secondary | ICD-10-CM | POA: Diagnosis not present

## 2016-09-28 DIAGNOSIS — I10 Essential (primary) hypertension: Secondary | ICD-10-CM

## 2016-09-28 DIAGNOSIS — R059 Cough, unspecified: Secondary | ICD-10-CM

## 2016-09-28 DIAGNOSIS — R7302 Impaired glucose tolerance (oral): Secondary | ICD-10-CM

## 2016-09-28 DIAGNOSIS — R062 Wheezing: Secondary | ICD-10-CM | POA: Diagnosis not present

## 2016-09-28 MED ORDER — HYDROCODONE-HOMATROPINE 5-1.5 MG/5ML PO SYRP: 5.0000 mL | ORAL_SOLUTION | Freq: Four times a day (QID) | ORAL | 0 refills | Status: AC | PRN

## 2016-09-28 MED ORDER — METHYLPREDNISOLONE ACETATE 80 MG/ML IJ SUSP
80.0000 mg | Freq: Once | INTRAMUSCULAR | Status: AC
  Administered 2016-09-28: 80 mg via INTRAMUSCULAR

## 2016-09-28 MED ORDER — AZITHROMYCIN 250 MG PO TABS: ORAL_TABLET | ORAL | 1 refills | Status: DC

## 2016-09-28 MED ORDER — PREDNISONE 10 MG PO TABS: ORAL_TABLET | ORAL | 0 refills | Status: DC

## 2016-09-28 NOTE — Progress Notes (Signed)
Pre visit review using our clinic review tool, if applicable. No additional management support is needed unless otherwise documented below in the visit note. 
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
## 2016-09-28 NOTE — Patient Instructions (Signed)
You had the steroid shot today  Please take all new medication as prescribed - the antibiotic, cough medicine, and prednisone  Please continue all other medications as before, and refills have been done if requested.  Please have the pharmacy call with any other refills you may need.  Please continue your efforts at being more active, low cholesterol diet, and weight control.  Please keep your appointments with your specialists as you may have planned     

## 2016-10-02 NOTE — Assessment & Plan Note (Signed)
Mild to mod, c/w bronchitis vs pna, for antibx course, cough med prn,  to f/u any worsening symptoms or concerns 

## 2016-10-02 NOTE — Assessment & Plan Note (Signed)
BP Readings from Last 3 Encounters:  09/28/16 130/70  07/28/16 130/72  05/25/16 124/72   stable overall by history and exam, recent data reviewed with pt, and pt to continue medical treatment as before,  to f/u any worsening symptoms or concerns

## 2016-10-02 NOTE — Assessment & Plan Note (Signed)
stable overall by history and exam, recent data reviewed with pt, and pt for depomedrol IM 80, predpac asd,,  to f/u any worsening symptoms or concerns @LASTSAO2 (3)@

## 2016-10-02 NOTE — Assessment & Plan Note (Signed)
Lab Results  Component Value Date   HGBA1C 5.7 11/30/2015   stable overall by history and exam, recent data reviewed with pt, and pt to continue medical treatment as before,  to f/u any worsening symptoms or concerns

## 2016-11-25 ENCOUNTER — Encounter: Payer: Self-pay | Admitting: Internal Medicine

## 2016-11-25 DIAGNOSIS — E119 Type 2 diabetes mellitus without complications: Secondary | ICD-10-CM | POA: Diagnosis not present

## 2016-11-25 DIAGNOSIS — H35373 Puckering of macula, bilateral: Secondary | ICD-10-CM | POA: Diagnosis not present

## 2016-11-25 DIAGNOSIS — H40023 Open angle with borderline findings, high risk, bilateral: Secondary | ICD-10-CM | POA: Diagnosis not present

## 2016-11-25 DIAGNOSIS — Z961 Presence of intraocular lens: Secondary | ICD-10-CM | POA: Diagnosis not present

## 2017-01-11 DIAGNOSIS — N183 Chronic kidney disease, stage 3 (moderate): Secondary | ICD-10-CM | POA: Diagnosis not present

## 2017-01-11 DIAGNOSIS — E119 Type 2 diabetes mellitus without complications: Secondary | ICD-10-CM | POA: Diagnosis not present

## 2017-01-11 DIAGNOSIS — I10 Essential (primary) hypertension: Secondary | ICD-10-CM | POA: Diagnosis not present

## 2017-01-12 ENCOUNTER — Other Ambulatory Visit: Payer: Self-pay | Admitting: Internal Medicine

## 2017-02-01 ENCOUNTER — Encounter: Payer: Medicare Other | Admitting: Internal Medicine

## 2017-02-01 DIAGNOSIS — Z0289 Encounter for other administrative examinations: Secondary | ICD-10-CM

## 2017-03-22 ENCOUNTER — Other Ambulatory Visit (INDEPENDENT_AMBULATORY_CARE_PROVIDER_SITE_OTHER): Payer: Medicare Other

## 2017-03-22 ENCOUNTER — Encounter: Payer: Self-pay | Admitting: Internal Medicine

## 2017-03-22 ENCOUNTER — Ambulatory Visit (INDEPENDENT_AMBULATORY_CARE_PROVIDER_SITE_OTHER): Payer: Medicare Other | Admitting: Internal Medicine

## 2017-03-22 VITALS — BP 140/82 | HR 52 | Ht 63.0 in | Wt 221.0 lb

## 2017-03-22 DIAGNOSIS — G4710 Hypersomnia, unspecified: Secondary | ICD-10-CM

## 2017-03-22 DIAGNOSIS — Z0000 Encounter for general adult medical examination without abnormal findings: Secondary | ICD-10-CM

## 2017-03-22 DIAGNOSIS — E785 Hyperlipidemia, unspecified: Secondary | ICD-10-CM | POA: Diagnosis not present

## 2017-03-22 DIAGNOSIS — E2839 Other primary ovarian failure: Secondary | ICD-10-CM | POA: Diagnosis not present

## 2017-03-22 DIAGNOSIS — M8580 Other specified disorders of bone density and structure, unspecified site: Secondary | ICD-10-CM | POA: Diagnosis not present

## 2017-03-22 DIAGNOSIS — J45901 Unspecified asthma with (acute) exacerbation: Secondary | ICD-10-CM

## 2017-03-22 DIAGNOSIS — R7302 Impaired glucose tolerance (oral): Secondary | ICD-10-CM

## 2017-03-22 DIAGNOSIS — E538 Deficiency of other specified B group vitamins: Secondary | ICD-10-CM

## 2017-03-22 LAB — LIPID PANEL
CHOL/HDL RATIO: 4
Cholesterol: 167 mg/dL (ref 0–200)
HDL: 45.9 mg/dL (ref >39.00)
LDL Cholesterol: 95 mg/dL (ref 0–99)
NONHDL: 121.5
Triglycerides: 133 mg/dL (ref 0.0–149.0)
VLDL: 26.6 mg/dL (ref 0.0–40.0)

## 2017-03-22 LAB — HEPATIC FUNCTION PANEL
ALK PHOS: 54 U/L (ref 39–117)
ALT: 17 U/L (ref 0–35)
AST: 24 U/L (ref 0–37)
Albumin: 4.1 g/dL (ref 3.5–5.2)
BILIRUBIN DIRECT: 0.1 mg/dL (ref 0.0–0.3)
BILIRUBIN TOTAL: 0.4 mg/dL (ref 0.2–1.2)
TOTAL PROTEIN: 7.6 g/dL (ref 6.0–8.3)

## 2017-03-22 LAB — CBC WITH DIFFERENTIAL/PLATELET
BASOS PCT: 0.7 % (ref 0.0–3.0)
Basophils Absolute: 0 10*3/uL (ref 0.0–0.1)
EOS ABS: 0 10*3/uL (ref 0.0–0.7)
Eosinophils Relative: 0.7 % (ref 0.0–5.0)
HCT: 34.3 % — ABNORMAL LOW (ref 36.0–46.0)
Hemoglobin: 11.4 g/dL — ABNORMAL LOW (ref 12.0–15.0)
LYMPHS ABS: 1.7 10*3/uL (ref 0.7–4.0)
Lymphocytes Relative: 36.2 % (ref 12.0–46.0)
MCHC: 33.2 g/dL (ref 30.0–36.0)
MCV: 89 fl (ref 78.0–100.0)
Monocytes Absolute: 0.4 10*3/uL (ref 0.1–1.0)
Monocytes Relative: 8.2 % (ref 3.0–12.0)
NEUTROS ABS: 2.5 10*3/uL (ref 1.4–7.7)
NEUTROS PCT: 54.2 % (ref 43.0–77.0)
PLATELETS: 152 10*3/uL (ref 150.0–400.0)
RBC: 3.85 Mil/uL — ABNORMAL LOW (ref 3.87–5.11)
RDW: 12.9 % (ref 11.5–15.5)
WBC: 4.7 10*3/uL (ref 4.0–10.5)

## 2017-03-22 LAB — URINALYSIS, ROUTINE W REFLEX MICROSCOPIC
BILIRUBIN URINE: NEGATIVE
Ketones, ur: NEGATIVE
Nitrite: NEGATIVE
PH: 6 (ref 5.0–8.0)
SPECIFIC GRAVITY, URINE: 1.02 (ref 1.000–1.030)
TOTAL PROTEIN, URINE-UPE24: NEGATIVE
UROBILINOGEN UA: 0.2 (ref 0.0–1.0)
Urine Glucose: NEGATIVE

## 2017-03-22 LAB — BASIC METABOLIC PANEL
BUN: 18 mg/dL (ref 6–23)
CHLORIDE: 109 meq/L (ref 96–112)
CO2: 26 mEq/L (ref 19–32)
Calcium: 10 mg/dL (ref 8.4–10.5)
Creatinine, Ser: 1.64 mg/dL — ABNORMAL HIGH (ref 0.40–1.20)
GFR: 39.22 mL/min — ABNORMAL LOW (ref >60.00)
Glucose, Bld: 108 mg/dL — ABNORMAL HIGH (ref 70–99)
POTASSIUM: 4.5 meq/L (ref 3.5–5.1)
Sodium: 141 mEq/L (ref 135–145)

## 2017-03-22 LAB — TSH: TSH: 5.84 u[IU]/mL — ABNORMAL HIGH (ref 0.35–4.50)

## 2017-03-22 LAB — HEMOGLOBIN A1C: HEMOGLOBIN A1C: 5.6 % (ref 4.6–6.5)

## 2017-03-22 MED ORDER — ALENDRONATE SODIUM 70 MG PO TABS: ORAL_TABLET | ORAL | 3 refills | Status: DC

## 2017-03-22 MED ORDER — GABAPENTIN 100 MG PO CAPS: 100.0000 mg | ORAL_CAPSULE | Freq: Three times a day (TID) | ORAL | 1 refills | Status: DC

## 2017-03-22 MED ORDER — ATENOLOL 50 MG PO TABS: 50.0000 mg | ORAL_TABLET | Freq: Every day | ORAL | 3 refills | Status: DC

## 2017-03-22 MED ORDER — FLUTICASONE-SALMETEROL 250-50 MCG/DOSE IN AEPB: 1.0000 | INHALATION_SPRAY | Freq: Two times a day (BID) | RESPIRATORY_TRACT | 3 refills | Status: DC

## 2017-03-22 MED ORDER — FERROUS SULFATE 325 (65 FE) MG PO TABS: 325.0000 mg | ORAL_TABLET | Freq: Every day | ORAL | 3 refills | Status: DC

## 2017-03-22 MED ORDER — ALBUTEROL SULFATE HFA 108 (90 BASE) MCG/ACT IN AERS: 2.0000 | INHALATION_SPRAY | Freq: Four times a day (QID) | RESPIRATORY_TRACT | 3 refills | Status: DC | PRN

## 2017-03-22 MED ORDER — FUROSEMIDE 40 MG PO TABS
ORAL_TABLET | ORAL | 3 refills | Status: DC
Start: 2017-03-22 — End: 2018-06-18

## 2017-03-22 MED ORDER — POTASSIUM CHLORIDE ER 10 MEQ PO TBCR: EXTENDED_RELEASE_TABLET | ORAL | 3 refills | Status: DC

## 2017-03-22 MED ORDER — TIZANIDINE HCL 4 MG PO TABS: 4.0000 mg | ORAL_TABLET | Freq: Four times a day (QID) | ORAL | 1 refills | Status: DC | PRN

## 2017-03-22 MED ORDER — LEVOTHYROXINE SODIUM 88 MCG PO TABS: 88.0000 ug | ORAL_TABLET | Freq: Every day | ORAL | 3 refills | Status: DC
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
## 2017-03-22 NOTE — Patient Instructions (Addendum)
Please schedule the bone density test before leaving today at the scheduling desk (where you check out)  You will be contacted regarding the referral for: colonoscopy for Sept 2018  Please continue all other medications as before, and refills have been done if requested.  Please have the pharmacy call with any other refills you may need.  Please continue your efforts at being more active, low cholesterol diet, and weight control.  You are otherwise up to date with prevention measures today.  You will be contacted regarding the referral for: pulmonary  Please keep your appointments with your specialists as you may have planned  Please go to the LAB in the Basement (turn left off the elevator) for the tests to be done today  You will be contacted by phone if any changes need to be made immediately.  Otherwise, you will receive a letter about your results with an explanation, but please check with MyChart first.  Please remember to sign up for MyChart if you have not done so, as this will be important to you in the future with finding out test results, communicating by private email, and scheduling acute appointments online when needed.  If you have Medicare related insurance (such as traditional Medicare, [employer] Medicare or [employer] Medicare, or similar), Please make an appointment at the [facility] desk with Noreene LarssonJill, the [facility], for your Wellness Visit in this office, which is a benefit with your insurance.  Please return in 1 year for your yearly visit, or sooner if needed

## 2017-03-26 NOTE — Assessment & Plan Note (Signed)
stable overall by history and exam, recent data reviewed with pt, and pt to continue medical treatment as before,  to f/u any worsening symptoms or concerns ' Lab Results  Component Value Date   LDLCALC 95 03/22/2017

## 2017-03-26 NOTE — Assessment & Plan Note (Signed)
stable overall by history and exam, recent data reviewed with pt, and pt to continue medical treatment as before,  to f/u any worsening symptoms or concerns Lab Results  Component Value Date   HGBA1C 5.6 03/22/2017

## 2017-03-26 NOTE — Assessment & Plan Note (Signed)
Due for f/u dxa 

## 2017-03-26 NOTE — Assessment & Plan Note (Signed)
For f/u b12 

## 2017-03-26 NOTE — Assessment & Plan Note (Signed)

## 2017-03-26 NOTE — Assessment & Plan Note (Signed)
Stable, cont same tx 

## 2017-03-26 NOTE — Assessment & Plan Note (Signed)
?   osa - for puolm referral

## 2017-03-29 ENCOUNTER — Ambulatory Visit (INDEPENDENT_AMBULATORY_CARE_PROVIDER_SITE_OTHER)
Admission: RE | Admit: 2017-03-29 | Discharge: 2017-03-29 | Disposition: A | Discharge status: Home / Self Care | Payer: Medicare Other | Source: Ambulatory Visit | Attending: Internal Medicine | Admitting: Internal Medicine

## 2017-03-29 DIAGNOSIS — E2839 Other primary ovarian failure: Secondary | ICD-10-CM | POA: Diagnosis not present

## 2017-04-05 ENCOUNTER — Encounter: Payer: Self-pay | Admitting: Internal Medicine

## 2017-04-06 ENCOUNTER — Encounter: Payer: Self-pay | Admitting: Internal Medicine

## 2017-06-15 ENCOUNTER — Encounter: Payer: Self-pay | Admitting: Gastroenterology

## 2017-06-22 ENCOUNTER — Ambulatory Visit (INDEPENDENT_AMBULATORY_CARE_PROVIDER_SITE_OTHER): Payer: Medicare Other | Admitting: Internal Medicine

## 2017-06-22 ENCOUNTER — Encounter: Payer: Self-pay | Admitting: Internal Medicine

## 2017-06-22 VITALS — BP 116/70 | HR 60 | Ht 63.0 in | Wt 223.0 lb

## 2017-06-22 DIAGNOSIS — G5603 Carpal tunnel syndrome, bilateral upper limbs: Secondary | ICD-10-CM | POA: Diagnosis not present

## 2017-06-22 DIAGNOSIS — M549 Dorsalgia, unspecified: Secondary | ICD-10-CM

## 2017-06-22 DIAGNOSIS — N62 Hypertrophy of breast: Secondary | ICD-10-CM | POA: Insufficient documentation

## 2017-06-22 DIAGNOSIS — G8929 Other chronic pain: Secondary | ICD-10-CM | POA: Diagnosis not present

## 2017-06-22 DIAGNOSIS — M67432 Ganglion, left wrist: Secondary | ICD-10-CM | POA: Diagnosis not present

## 2017-06-22 MED ORDER — DICLOFENAC SODIUM 1 % TD GEL: 4.0000 g | Freq: Four times a day (QID) | TRANSDERMAL | 11 refills | Status: DC

## 2017-06-22 NOTE — Patient Instructions (Signed)
Please take all new medication as prescribed - the voltaren gel as needed for pain  You will be contacted regarding the referral [facility]  Please continue all other medications as before, and refills have been done if requested.  Please have the pharmacy call with any other refills you may need.  Please keep your appointments with your specialists as you may have planned
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
## 2017-06-25 NOTE — Assessment & Plan Note (Signed)
As above.

## 2017-06-25 NOTE — Assessment & Plan Note (Signed)
Ok for volt gel prn,  to f/u any worsening symptoms or concerns with hand surgury

## 2017-06-25 NOTE — Assessment & Plan Note (Signed)
Due to large breasts, for plastic surgury for consideration breast reduction

## 2017-06-25 NOTE — Assessment & Plan Note (Signed)
Mod to severe symptomatic, for hand surgury referral

## 2017-07-06 ENCOUNTER — Telehealth: Payer: Self-pay | Admitting: Internal Medicine

## 2017-07-06 NOTE — Telephone Encounter (Signed)
PA are handle by MD assistant which shirron is out of the office today. Will forward to her desktop as well, and follow-up on PA...[name]

## 2017-07-06 NOTE — Telephone Encounter (Signed)
Pt called and wanted to check the status of the PA for the diclofenac sodium (VOLTAREN) 1 % GEL [[id number]

## 2017-07-11 NOTE — Telephone Encounter (Signed)
Submitted on 07/11/17 Key: [facility]

## 2017-07-11 NOTE — Telephone Encounter (Signed)
Request was denied. It is suggested that the pt try one of the formulary alternatives. Pt has been informed and expressed understanding.

## 2017-07-20 DIAGNOSIS — M19041 Primary osteoarthritis, right hand: Secondary | ICD-10-CM | POA: Diagnosis not present

## 2017-07-20 DIAGNOSIS — M19042 Primary osteoarthritis, left hand: Secondary | ICD-10-CM | POA: Diagnosis not present

## 2017-07-20 DIAGNOSIS — M654 Radial styloid tenosynovitis [de Quervain]: Secondary | ICD-10-CM | POA: Diagnosis not present

## 2017-07-24 ENCOUNTER — Ambulatory Visit (INDEPENDENT_AMBULATORY_CARE_PROVIDER_SITE_OTHER): Payer: Medicare Other | Admitting: Internal Medicine

## 2017-07-24 ENCOUNTER — Encounter: Payer: Self-pay | Admitting: Internal Medicine

## 2017-07-24 VITALS — BP 122/74 | HR 53 | Ht 63.0 in | Wt 221.4 lb

## 2017-07-24 DIAGNOSIS — G4733 Obstructive sleep apnea (adult) (pediatric): Secondary | ICD-10-CM

## 2017-07-24 DIAGNOSIS — Z23 Encounter for immunization: Secondary | ICD-10-CM | POA: Diagnosis not present

## 2017-07-24 DIAGNOSIS — G4719 Other hypersomnia: Secondary | ICD-10-CM | POA: Insufficient documentation
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
## 2017-07-24 NOTE — Assessment & Plan Note (Signed)
She is going to start trying to get a little more sleep in bed at night by going to bed earlier with goal of at least 7 hours of nighttime sleep and permission to take a short nap occasionally.  Dr. Jonny RuizJohn may feel there is room to increase her thyroid suplement. We are scheduling a sleep study to exclude sleep disturbance from sleep apnea. We discussed sleep apnea, physiology, medical concerns, treatment options. Plan-schedule sleep study. She will call for results about 2 weeks later.

## 2017-07-24 NOTE — Assessment & Plan Note (Signed)
Her body weight impacts several comorbidities including blood sugar control, blood pressure and possibly sleep apnea. Plan-consider bariatric referral for counseling.

## 2017-07-24 NOTE — Patient Instructions (Signed)
Order- please schedule unattended home sleep test    Dx OSA  Please call me about 2 weeks after your sleep study, to get the report and recommendations.

## 2017-07-31 ENCOUNTER — Telehealth: Payer: Self-pay | Admitting: Internal Medicine

## 2017-07-31 ENCOUNTER — Ambulatory Visit (AMBULATORY_SURGERY_CENTER): Payer: Self-pay | Admitting: *Deleted

## 2017-07-31 DIAGNOSIS — Z1211 Encounter for screening for malignant neoplasm of colon: Secondary | ICD-10-CM

## 2017-07-31 DIAGNOSIS — R7989 Other specified abnormal findings of blood chemistry: Secondary | ICD-10-CM

## 2017-07-31 MED ORDER — NA SULFATE-K SULFATE-MG SULF 17.5-3.13-1.6 GM/177ML PO SOLN: 1.0000 | Freq: Once | ORAL | 0 refills | Status: AC

## 2017-07-31 NOTE — Telephone Encounter (Signed)
Pt came by the office stating that she was seen by Dr Maple Hudson in Pulmonary last week. He mentioned that he thyroid may need to be supplemented and she wanted to know what Dr Jonny Ruiz thought. Please advise.

## 2017-07-31 NOTE — Progress Notes (Signed)
No egg or soy allergy known to patient  No issues with past sedation with any surgeries  or procedures, no intubation problems  No diet pills per patient No home 02 use per patient  No blood thinners per patient  Pt denies issues with constipation  No A fib or A flutter  EMMI video sent to pt's e mail - pt has no computer for e mail

## 2017-08-01 NOTE — Telephone Encounter (Signed)
[name] to contact pt - A slightly temporarily elevated TSH is common.    Ok to repeat the thyroid tests at her convenience, as if this is persistent or worsening, she may need low dose thyroid medication. thanks

## 2017-08-01 NOTE — Telephone Encounter (Signed)
Called pt, LVM.   

## 2017-08-02 ENCOUNTER — Telehealth: Payer: Self-pay

## 2017-08-02 ENCOUNTER — Other Ambulatory Visit: Payer: Self-pay | Admitting: Internal Medicine

## 2017-08-02 ENCOUNTER — Telehealth: Payer: Self-pay | Admitting: Gastroenterology

## 2017-08-02 ENCOUNTER — Encounter: Payer: Self-pay | Admitting: Internal Medicine

## 2017-08-02 ENCOUNTER — Encounter: Payer: Self-pay | Admitting: Gastroenterology

## 2017-08-02 ENCOUNTER — Other Ambulatory Visit (INDEPENDENT_AMBULATORY_CARE_PROVIDER_SITE_OTHER): Payer: Medicare Other

## 2017-08-02 DIAGNOSIS — R7989 Other specified abnormal findings of blood chemistry: Secondary | ICD-10-CM

## 2017-08-02 DIAGNOSIS — R946 Abnormal results of thyroid function studies: Secondary | ICD-10-CM

## 2017-08-02 LAB — TSH: TSH: 9.88 u[IU]/mL — ABNORMAL HIGH (ref 0.35–4.50)

## 2017-08-02 LAB — T4, FREE: Free T4: 0.68 ng/dL (ref 0.60–1.60)

## 2017-08-02 MED ORDER — LEVOTHYROXINE SODIUM 100 MCG PO TABS: 100.0000 ug | ORAL_TABLET | Freq: Every day | ORAL | 3 refills | Status: DC

## 2017-08-02 NOTE — Telephone Encounter (Signed)
Called pt, LVM.   

## 2017-08-02 NOTE — Telephone Encounter (Signed)
-----   Message from Corwin Levins, MD sent at 08/02/2017 12:18 PM EDT ----- Correction - ok for Jaequan Propes to let pt know, the thyroid was mild slow, so we increased the levothyroxine (thyroid medication) from 88 to 100 mcg per day

## 2017-08-02 NOTE — Telephone Encounter (Signed)
Suprep sample placed at 4th floor receptionist desk Pt called and [facility] for her to come and pick it up Youlanda Tomassetti/RN

## 2017-08-02 NOTE — Telephone Encounter (Signed)
Pt informed of response, she will come into the lab. She expressed understanding and had no questions.

## 2017-08-09 ENCOUNTER — Ambulatory Visit: Payer: Medicare Other | Admitting: Internal Medicine

## 2017-08-14 ENCOUNTER — Encounter: Payer: Medicare Other | Admitting: Gastroenterology

## 2017-08-15 DIAGNOSIS — G4733 Obstructive sleep apnea (adult) (pediatric): Secondary | ICD-10-CM | POA: Diagnosis not present

## 2017-08-17 DIAGNOSIS — G5602 Carpal tunnel syndrome, left upper limb: Secondary | ICD-10-CM | POA: Diagnosis not present

## 2017-08-17 DIAGNOSIS — M654 Radial styloid tenosynovitis [de Quervain]: Secondary | ICD-10-CM | POA: Diagnosis not present

## 2017-08-17 DIAGNOSIS — G5601 Carpal tunnel syndrome, right upper limb: Secondary | ICD-10-CM | POA: Diagnosis not present

## 2017-08-21 DIAGNOSIS — G4733 Obstructive sleep apnea (adult) (pediatric): Secondary | ICD-10-CM | POA: Diagnosis not present

## 2017-08-22 ENCOUNTER — Encounter: Payer: Self-pay | Admitting: Internal Medicine

## 2017-08-22 ENCOUNTER — Ambulatory Visit (INDEPENDENT_AMBULATORY_CARE_PROVIDER_SITE_OTHER): Payer: Medicare Other | Admitting: Internal Medicine

## 2017-08-22 VITALS — BP 138/86 | HR 61 | Temp 98.4°F | Ht 63.5 in | Wt 222.0 lb

## 2017-08-22 DIAGNOSIS — I10 Essential (primary) hypertension: Secondary | ICD-10-CM

## 2017-08-22 DIAGNOSIS — E039 Hypothyroidism, unspecified: Secondary | ICD-10-CM | POA: Diagnosis not present

## 2017-08-22 DIAGNOSIS — Z021 Encounter for pre-employment examination: Secondary | ICD-10-CM | POA: Diagnosis not present

## 2017-08-22 DIAGNOSIS — R7302 Impaired glucose tolerance (oral): Secondary | ICD-10-CM

## 2017-08-22 DIAGNOSIS — Z111 Encounter for screening for respiratory tuberculosis: Secondary | ICD-10-CM

## 2017-08-22 MED ORDER — LEVOTHYROXINE SODIUM 100 MCG PO TABS: 100.0000 ug | ORAL_TABLET | Freq: Every day | ORAL | 3 refills | Status: DC

## 2017-08-22 NOTE — Assessment & Plan Note (Signed)
stable overall by history and exam, recent data reviewed with pt, and pt to continue medical treatment as before,  to f/u any worsening symptoms or concerns BP Readings from Last 3 Encounters:  08/22/17 138/86  07/24/17 122/74  06/22/17 116/70

## 2017-08-22 NOTE — Patient Instructions (Signed)
Your TB skin test was placed today; please return on Thursday or Friday later this week to have it read at a Nurse Visit Appointment  (to be made at the scheduling desk as you leave today)  Your Work note was given today  Your forms were signed except for the final TB test form  OK to increase the levothyroxine to 100 mcg per day  Please continue all other medications as before, and refills have been done if requested.  Please have the pharmacy call with any other refills you may need.  Please keep your appointments with your specialists as you may have planned

## 2017-08-22 NOTE — Assessment & Plan Note (Signed)
stable overall by history and exam, recent data reviewed with pt, and pt to continue medical treatment as before,  to f/u any worsening symptoms or concerns Lab Results  Component Value Date   HGBA1C 5.6 03/22/2017
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
## 2017-08-22 NOTE — Assessment & Plan Note (Signed)
For PPD placement today, with f/u reading at 48-72 hrs

## 2017-08-22 NOTE — Assessment & Plan Note (Signed)
Mild increased TSH recent, ok to increase the levothyroxine to 100 mcg per day,  to f/u any worsening symptoms or concerns

## 2017-08-23 ENCOUNTER — Other Ambulatory Visit: Payer: Self-pay | Admitting: *Deleted

## 2017-08-23 DIAGNOSIS — G4733 Obstructive sleep apnea (adult) (pediatric): Secondary | ICD-10-CM

## 2017-08-24 LAB — TB SKIN TEST
INDURATION: 0 mm
TB Skin Test: NEGATIVE

## 2017-10-11 ENCOUNTER — Ambulatory Visit (AMBULATORY_SURGERY_CENTER): Payer: Self-pay | Admitting: *Deleted

## 2017-10-11 ENCOUNTER — Ambulatory Visit: Payer: Medicare Other | Admitting: Adult Health

## 2017-10-11 ENCOUNTER — Encounter: Payer: Self-pay | Admitting: Gastroenterology

## 2017-10-11 ENCOUNTER — Other Ambulatory Visit: Payer: Self-pay

## 2017-10-11 ENCOUNTER — Encounter: Payer: Self-pay | Admitting: Adult Health

## 2017-10-11 VITALS — Ht 63.0 in | Wt 224.6 lb

## 2017-10-11 DIAGNOSIS — Z1211 Encounter for screening for malignant neoplasm of colon: Secondary | ICD-10-CM

## 2017-10-11 DIAGNOSIS — G4733 Obstructive sleep apnea (adult) (pediatric): Secondary | ICD-10-CM | POA: Insufficient documentation

## 2017-10-11 NOTE — Patient Instructions (Addendum)
Call us back if you change your mind on CPAP for your sleep apnea.  If you would like to look into oral appliance, can call Dr. Myrtis SerKatz. [phone])  Work on healthy weight .  Do not drive if sleepy .  Follow up Dr. Maple HudsonYoung  As needed

## 2017-10-11 NOTE — Progress Notes (Signed)
No egg or soy allergy known to patient  No issues with past sedation with any surgeries  or procedures, no intubation problems  No diet pills per patient No home 02 use per patient  No blood thinners per patient  Pt denies issues with constipation not currently No A fib or A flutter  EMMI video sent to pt's e mail  No email address
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
## 2017-10-11 NOTE — Assessment & Plan Note (Signed)
Wt loss  

## 2017-10-11 NOTE — Assessment & Plan Note (Signed)
Mild OSA  Advised of test results and potential complications of untreated OSA .  She declines treatment at this time   Plan  . Patient Instructions  Call us back if you change your mind on CPAP for your sleep apnea.  If you would like to look into oral appliance, can call Dr. Myrtis SerKatz. [phone])  Work on healthy weight .  Do not drive if sleepy .  Follow up Dr. Maple HudsonYoung  As needed

## 2017-10-25 ENCOUNTER — Encounter: Payer: Medicare Other | Admitting: Gastroenterology

## 2017-10-26 ENCOUNTER — Encounter: Payer: Self-pay | Admitting: Gastroenterology

## 2017-11-20 ENCOUNTER — Ambulatory Visit: Payer: Medicare Other | Admitting: Internal Medicine

## 2017-11-28 DIAGNOSIS — H40023 Open angle with borderline findings, high risk, bilateral: Secondary | ICD-10-CM | POA: Diagnosis not present

## 2017-11-28 DIAGNOSIS — Z961 Presence of intraocular lens: Secondary | ICD-10-CM | POA: Diagnosis not present

## 2017-11-28 DIAGNOSIS — H26491 Other secondary cataract, right eye: Secondary | ICD-10-CM | POA: Diagnosis not present

## 2017-11-28 DIAGNOSIS — E119 Type 2 diabetes mellitus without complications: Secondary | ICD-10-CM | POA: Diagnosis not present

## 2018-01-09 ENCOUNTER — Telehealth: Payer: Self-pay

## 2018-01-09 ENCOUNTER — Ambulatory Visit (INDEPENDENT_AMBULATORY_CARE_PROVIDER_SITE_OTHER): Payer: Medicare Other | Admitting: Internal Medicine

## 2018-01-09 ENCOUNTER — Encounter: Payer: Self-pay | Admitting: Internal Medicine

## 2018-01-09 VITALS — BP 124/86 | HR 67 | Temp 97.8°F | Ht 63.0 in | Wt 230.0 lb

## 2018-01-09 DIAGNOSIS — R05 Cough: Secondary | ICD-10-CM

## 2018-01-09 DIAGNOSIS — R7302 Impaired glucose tolerance (oral): Secondary | ICD-10-CM

## 2018-01-09 DIAGNOSIS — R062 Wheezing: Secondary | ICD-10-CM

## 2018-01-09 DIAGNOSIS — I10 Essential (primary) hypertension: Secondary | ICD-10-CM

## 2018-01-09 DIAGNOSIS — R059 Cough, unspecified: Secondary | ICD-10-CM

## 2018-01-09 MED ORDER — HYDROCODONE-HOMATROPINE 5-1.5 MG/5ML PO SYRP: 5.0000 mL | ORAL_SOLUTION | Freq: Four times a day (QID) | ORAL | 0 refills | Status: AC | PRN

## 2018-01-09 MED ORDER — AZITHROMYCIN 250 MG PO TABS: ORAL_TABLET | ORAL | 1 refills | Status: DC

## 2018-01-09 MED ORDER — PREDNISONE 10 MG PO TABS: ORAL_TABLET | ORAL | 0 refills | Status: DC

## 2018-01-09 NOTE — Patient Instructions (Addendum)
Please take all new medication as prescribed - the antibiotic, cough medicine, and prednisone  Please continue all other medications as before, and refills have been done if requested.  Please have the pharmacy call with any other refills you may need.  Please continue your efforts at being more active, low cholesterol diet, and weight control.  Please keep your appointments with your specialists as you may have planned  You are given the work note today

## 2018-01-09 NOTE — Assessment & Plan Note (Signed)
stable overall by history and exam, recent data reviewed with pt, and pt to continue medical treatment as before,  to f/u any worsening symptoms or concerns BP Readings from Last 3 Encounters:  01/09/18 124/86  10/11/17 136/64  08/22/17 138/86

## 2018-01-09 NOTE — Assessment & Plan Note (Signed)
stable overall by history and exam, recent data reviewed with pt, and pt to continue medical treatment as before,  to f/u any worsening symptoms or concerns Lab Results  Component Value Date   HGBA1C 5.6 03/22/2017  pt to call for onset polys with steroid tx
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
## 2018-01-09 NOTE — Telephone Encounter (Signed)
Copied from CRM [id number]. Topic: General - Other >> Jan 09, 2018  9:06 AM [name], [name], NT wrote: Reason for CRM: Patient called back and said she was just in the office and forgot to give the doctor some information about [facility]. Pt said  address is [address] [phone] and Mrs. Augusto GarbeM Lewis is Catering managerthe director.

## 2018-01-09 NOTE — Assessment & Plan Note (Signed)
Mild to mod, c/w bronchitis vs pna, for antibx course, cough medicine prn, declines cxr, to f/u any worsening symptoms or concerns

## 2018-01-09 NOTE — Assessment & Plan Note (Signed)
/  Mild to mod, for predpac asd,  to f/u any worsening symptoms or concerns 

## 2018-02-20 DIAGNOSIS — N183 Chronic kidney disease, stage 3 (moderate): Secondary | ICD-10-CM | POA: Diagnosis not present

## 2018-02-20 DIAGNOSIS — I129 Hypertensive chronic kidney disease with stage 1 through stage 4 chronic kidney disease, or unspecified chronic kidney disease: Secondary | ICD-10-CM | POA: Diagnosis not present

## 2018-02-20 DIAGNOSIS — E1122 Type 2 diabetes mellitus with diabetic chronic kidney disease: Secondary | ICD-10-CM | POA: Diagnosis not present

## 2018-03-22 ENCOUNTER — Ambulatory Visit: Payer: Medicare Other | Admitting: Internal Medicine

## 2018-03-23 ENCOUNTER — Encounter: Payer: Self-pay | Admitting: Internal Medicine

## 2018-03-23 ENCOUNTER — Other Ambulatory Visit (INDEPENDENT_AMBULATORY_CARE_PROVIDER_SITE_OTHER): Payer: Medicare Other

## 2018-03-23 ENCOUNTER — Telehealth: Payer: Self-pay | Admitting: Internal Medicine

## 2018-03-23 ENCOUNTER — Ambulatory Visit (INDEPENDENT_AMBULATORY_CARE_PROVIDER_SITE_OTHER): Payer: Medicare Other | Admitting: Internal Medicine

## 2018-03-23 VITALS — BP 138/70 | HR 55 | Temp 98.7°F | Ht 63.0 in | Wt 228.5 lb

## 2018-03-23 DIAGNOSIS — Z0001 Encounter for general adult medical examination with abnormal findings: Secondary | ICD-10-CM

## 2018-03-23 DIAGNOSIS — M79645 Pain in left finger(s): Secondary | ICD-10-CM

## 2018-03-23 DIAGNOSIS — M79662 Pain in left lower leg: Secondary | ICD-10-CM | POA: Diagnosis not present

## 2018-03-23 DIAGNOSIS — M25562 Pain in left knee: Secondary | ICD-10-CM | POA: Diagnosis not present

## 2018-03-23 DIAGNOSIS — D509 Iron deficiency anemia, unspecified: Secondary | ICD-10-CM

## 2018-03-23 DIAGNOSIS — R7302 Impaired glucose tolerance (oral): Secondary | ICD-10-CM

## 2018-03-23 DIAGNOSIS — S81802A Unspecified open wound, left lower leg, initial encounter: Secondary | ICD-10-CM | POA: Diagnosis not present

## 2018-03-23 DIAGNOSIS — M7989 Other specified soft tissue disorders: Secondary | ICD-10-CM | POA: Diagnosis not present

## 2018-03-23 DIAGNOSIS — E538 Deficiency of other specified B group vitamins: Secondary | ICD-10-CM

## 2018-03-23 LAB — CBC WITH DIFFERENTIAL/PLATELET
BASOS PCT: 0.2 % (ref 0.0–3.0)
Basophils Absolute: 0 10*3/uL (ref 0.0–0.1)
EOS PCT: 1.1 % (ref 0.0–5.0)
Eosinophils Absolute: 0.1 10*3/uL (ref 0.0–0.7)
HEMATOCRIT: 33.1 % — AB (ref 36.0–46.0)
Hemoglobin: 11.2 g/dL — ABNORMAL LOW (ref 12.0–15.0)
LYMPHS PCT: 30.5 % (ref 12.0–46.0)
Lymphs Abs: 1.6 10*3/uL (ref 0.7–4.0)
MCHC: 33.8 g/dL (ref 30.0–36.0)
MCV: 88.1 fl (ref 78.0–100.0)
MONOS PCT: 10.2 % (ref 3.0–12.0)
Monocytes Absolute: 0.5 10*3/uL (ref 0.1–1.0)
Neutro Abs: 3.1 10*3/uL (ref 1.4–7.7)
Neutrophils Relative %: 58 % (ref 43.0–77.0)
Platelets: 154 10*3/uL (ref 150.0–400.0)
RBC: 3.76 Mil/uL — ABNORMAL LOW (ref 3.87–5.11)
RDW: 13.7 % (ref 11.5–15.5)
WBC: 5.4 10*3/uL (ref 4.0–10.5)

## 2018-03-23 LAB — LIPID PANEL
CHOL/HDL RATIO: 4
Cholesterol: 154 mg/dL (ref 0–200)
HDL: 40.6 mg/dL (ref >39.00)
LDL CALC: 88 mg/dL (ref 0–99)
NONHDL: 113.37
TRIGLYCERIDES: 125 mg/dL (ref 0.0–149.0)
VLDL: 25 mg/dL (ref 0.0–40.0)

## 2018-03-23 LAB — BASIC METABOLIC PANEL
BUN: 16 mg/dL (ref 6–23)
CHLORIDE: 108 meq/L (ref 96–112)
CO2: 29 meq/L (ref 19–32)
CREATININE: 1.56 mg/dL — AB (ref 0.40–1.20)
Calcium: 9.4 mg/dL (ref 8.4–10.5)
GFR: 41.44 mL/min — ABNORMAL LOW (ref >60.00)
GLUCOSE: 101 mg/dL — AB (ref 70–99)
Potassium: 4.8 mEq/L (ref 3.5–5.1)
Sodium: 142 mEq/L (ref 135–145)

## 2018-03-23 LAB — URINALYSIS, ROUTINE W REFLEX MICROSCOPIC
Bilirubin Urine: NEGATIVE
Ketones, ur: NEGATIVE
Nitrite: NEGATIVE
SPECIFIC GRAVITY, URINE: 1.01 (ref 1.000–1.030)
TOTAL PROTEIN, URINE-UPE24: NEGATIVE
Urine Glucose: NEGATIVE
Urobilinogen, UA: 0.2 (ref 0.0–1.0)
pH: 7 (ref 5.0–8.0)

## 2018-03-23 LAB — HEPATIC FUNCTION PANEL
ALT: 14 U/L (ref 0–35)
AST: 21 U/L (ref 0–37)
Albumin: 3.8 g/dL (ref 3.5–5.2)
Alkaline Phosphatase: 47 U/L (ref 39–117)
BILIRUBIN DIRECT: 0.1 mg/dL (ref 0.0–0.3)
Total Bilirubin: 0.4 mg/dL (ref 0.2–1.2)
Total Protein: 7.4 g/dL (ref 6.0–8.3)

## 2018-03-23 LAB — IBC PANEL
Iron: 114 ug/dL (ref 42–145)
SATURATION RATIOS: 37.7 % (ref 20.0–50.0)
TRANSFERRIN: 216 mg/dL (ref 212.0–360.0)

## 2018-03-23 LAB — VITAMIN B12: VITAMIN B 12: 198 pg/mL — AB (ref 211–911)

## 2018-03-23 LAB — MICROALBUMIN / CREATININE URINE RATIO
Creatinine,U: 120.4 mg/dL
Microalb Creat Ratio: 0.6 mg/g (ref 0.0–30.0)
Microalb, Ur: <0.7 mg/dL (ref 0.0–1.9)

## 2018-03-23 LAB — HEMOGLOBIN A1C: HEMOGLOBIN A1C: 5.8 % (ref 4.6–6.5)

## 2018-03-23 LAB — TSH: TSH: 5.75 u[IU]/mL — ABNORMAL HIGH (ref 0.35–4.50)

## 2018-03-23 MED ORDER — DICLOFENAC SODIUM 1 % TD GEL: 4.0000 g | Freq: Four times a day (QID) | TRANSDERMAL | 11 refills | Status: DC | PRN
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
## 2018-03-23 NOTE — Patient Instructions (Addendum)
Please take all new medication as prescribed - the voltaren gel again if ok with insurance  Please continue all other medications as before, and refills have been done if requested.  Please have the pharmacy call with any other refills you may need.  Please continue your efforts at being more active, low cholesterol diet, and weight control.  You are otherwise up to date with prevention measures today.  Please keep your appointments with your specialists as you may have planned - orthopedic for the left knee, and the hearing specialist as you mentioned  You will be contacted regarding the referral for: left leg vein circulation test - to see PCCs now  Please go to the LAB in the Basement (turn left off the elevator) for the tests to be done today  You will be contacted by phone if any changes need to be made immediately.  Otherwise, you will receive a letter about your results with an explanation, but please check with MyChart first.  Please remember to sign up for MyChart if you have not done so, as this will be important to you in the future with finding out test results, communicating by private email, and scheduling acute appointments online when needed.  Please return in 6 months, or sooner if needed, with Lab testing done 3-5 days before  You are given the Letter today

## 2018-03-23 NOTE — Telephone Encounter (Signed)
Copied from CRM [id number]. Topic: Appointment Scheduling - Scheduling Inquiry for Clinic >> Mar 23, 2018 10:30 AM [name] wrote: Reason for CRM:   PT was calling about hand surgeon referral and with her hard of hearing and does not know how to get messages on phone.  She is asking if can text her the date and time for appt. And who will be seeing

## 2018-03-24 NOTE — Assessment & Plan Note (Signed)
stable overall by history and exam, recent data reviewed with pt, and pt to continue medical treatment as before,  to f/u any worsening symptoms or concerns Lab Results  Component Value Date   HGBA1C 5.8 03/23/2018

## 2018-03-24 NOTE — Assessment & Plan Note (Addendum)
Also for volt gel prn, f/u ortho as planned   In addition to the time spent performing CPE, I spent an additional 25 minutes face to face,in which greater than 50% of this time was spent in counseling and coordination of care for patient's acute illness as documented, including the differential dx, treatment, further evaluation and other management of left knee pain, left thumb pain, hyperglycemia, left leg wound, left leg pain and swelling

## 2018-03-24 NOTE — Assessment & Plan Note (Signed)
Due to DJD, for volt gel prn,  to f/u any worsening symptoms or concerns 

## 2018-03-24 NOTE — Assessment & Plan Note (Signed)
Near healed, to cont same tx with neosporin and cover until resolved=

## 2018-03-24 NOTE — Assessment & Plan Note (Signed)
Likely related to left knee DJD and swelling, but cant r/o other - for LLE venous doppler r/o DVT

## 2018-03-24 NOTE — Assessment & Plan Note (Signed)

## 2018-03-26 ENCOUNTER — Ambulatory Visit (HOSPITAL_COMMUNITY)
Admission: RE | Admit: 2018-03-26 | Discharge: 2018-03-26 | Disposition: A | Discharge status: Home / Self Care | Payer: Medicare Other | Source: Ambulatory Visit | Attending: Cardiovascular Disease | Admitting: Cardiovascular Disease

## 2018-03-26 ENCOUNTER — Encounter: Payer: Self-pay | Admitting: Internal Medicine

## 2018-03-26 DIAGNOSIS — M79662 Pain in left lower leg: Secondary | ICD-10-CM | POA: Insufficient documentation

## 2018-03-26 DIAGNOSIS — M7989 Other specified soft tissue disorders: Secondary | ICD-10-CM | POA: Diagnosis not present

## 2018-04-10 ENCOUNTER — Other Ambulatory Visit: Payer: Self-pay

## 2018-04-10 MED ORDER — ATENOLOL 100 MG PO TABS: 100.0000 mg | ORAL_TABLET | Freq: Every day | ORAL | 3 refills | Status: DC

## 2018-04-10 MED ORDER — ALENDRONATE SODIUM 70 MG PO TABS: ORAL_TABLET | ORAL | 3 refills | Status: DC

## 2018-04-20 IMAGING — DX DG CHEST 2V
2 series · 2 of 2 positions shown · non-contrast
Comparison: 11/02/2013.

CLINICAL DATA: Cough.  Shortness of breath, and wheezing.

EXAM:
CHEST - 2 VIEW

[x chest ap]
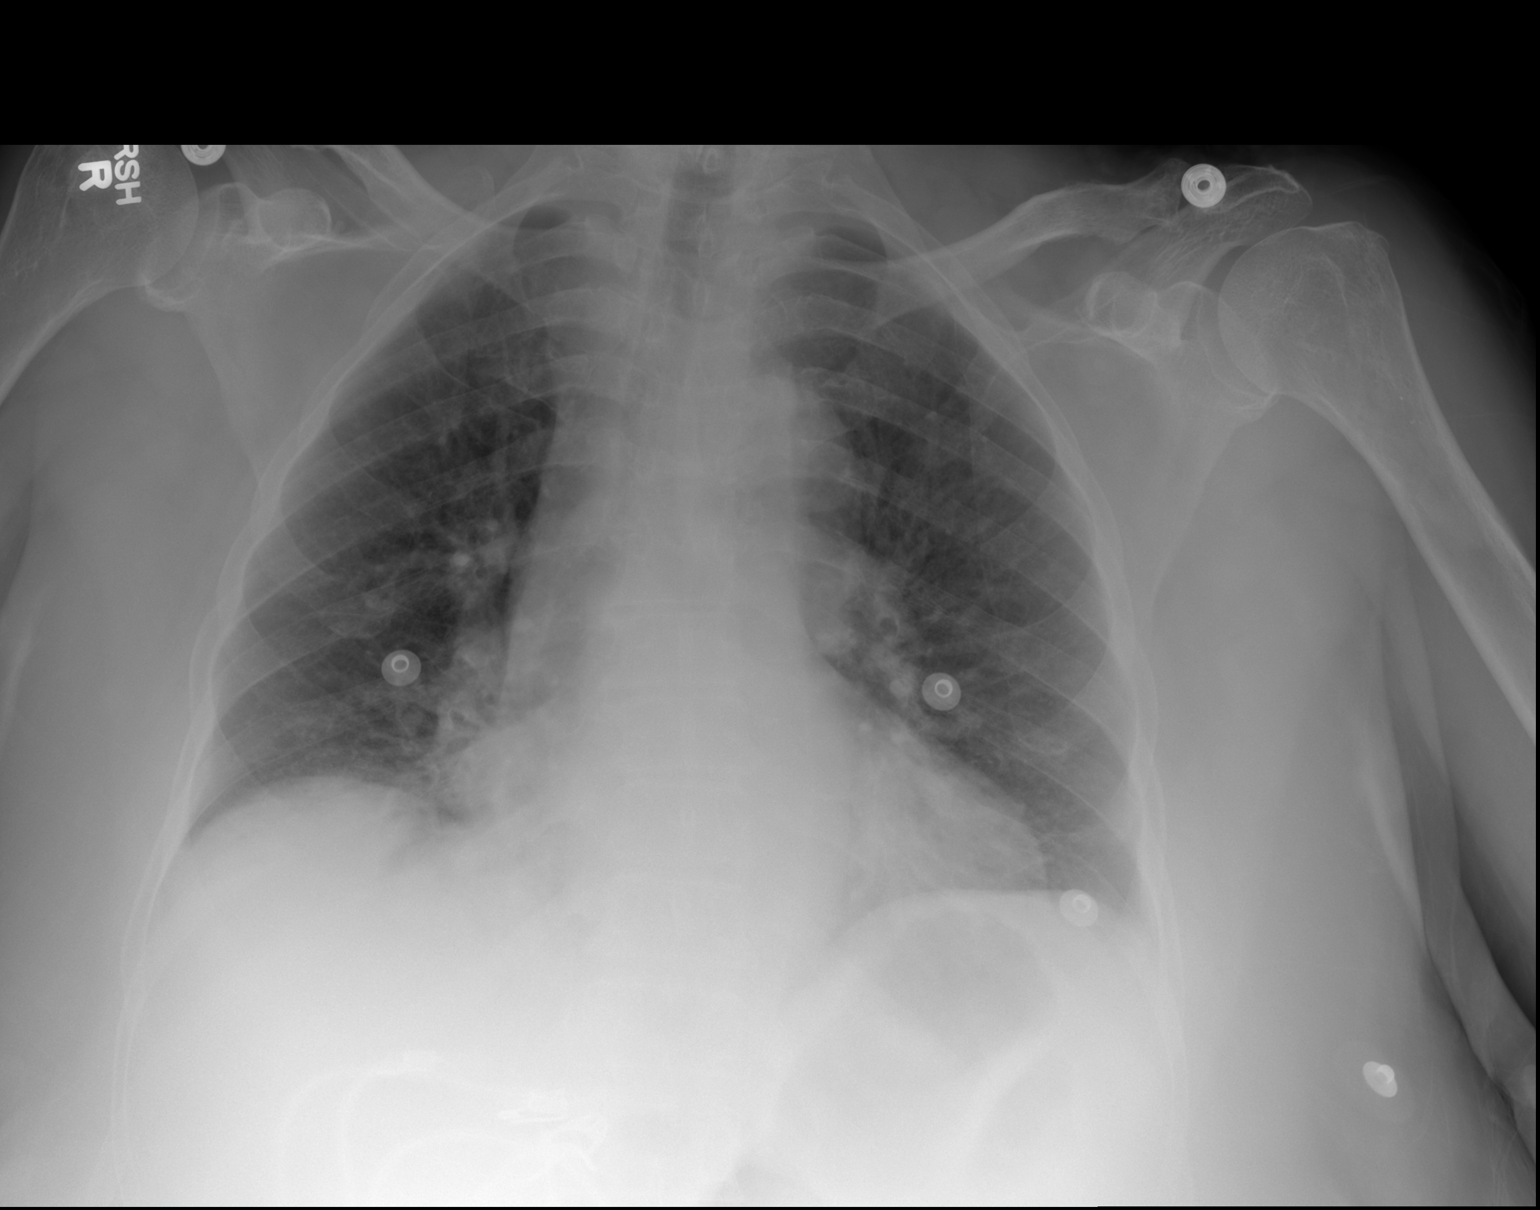

[w chest lat]
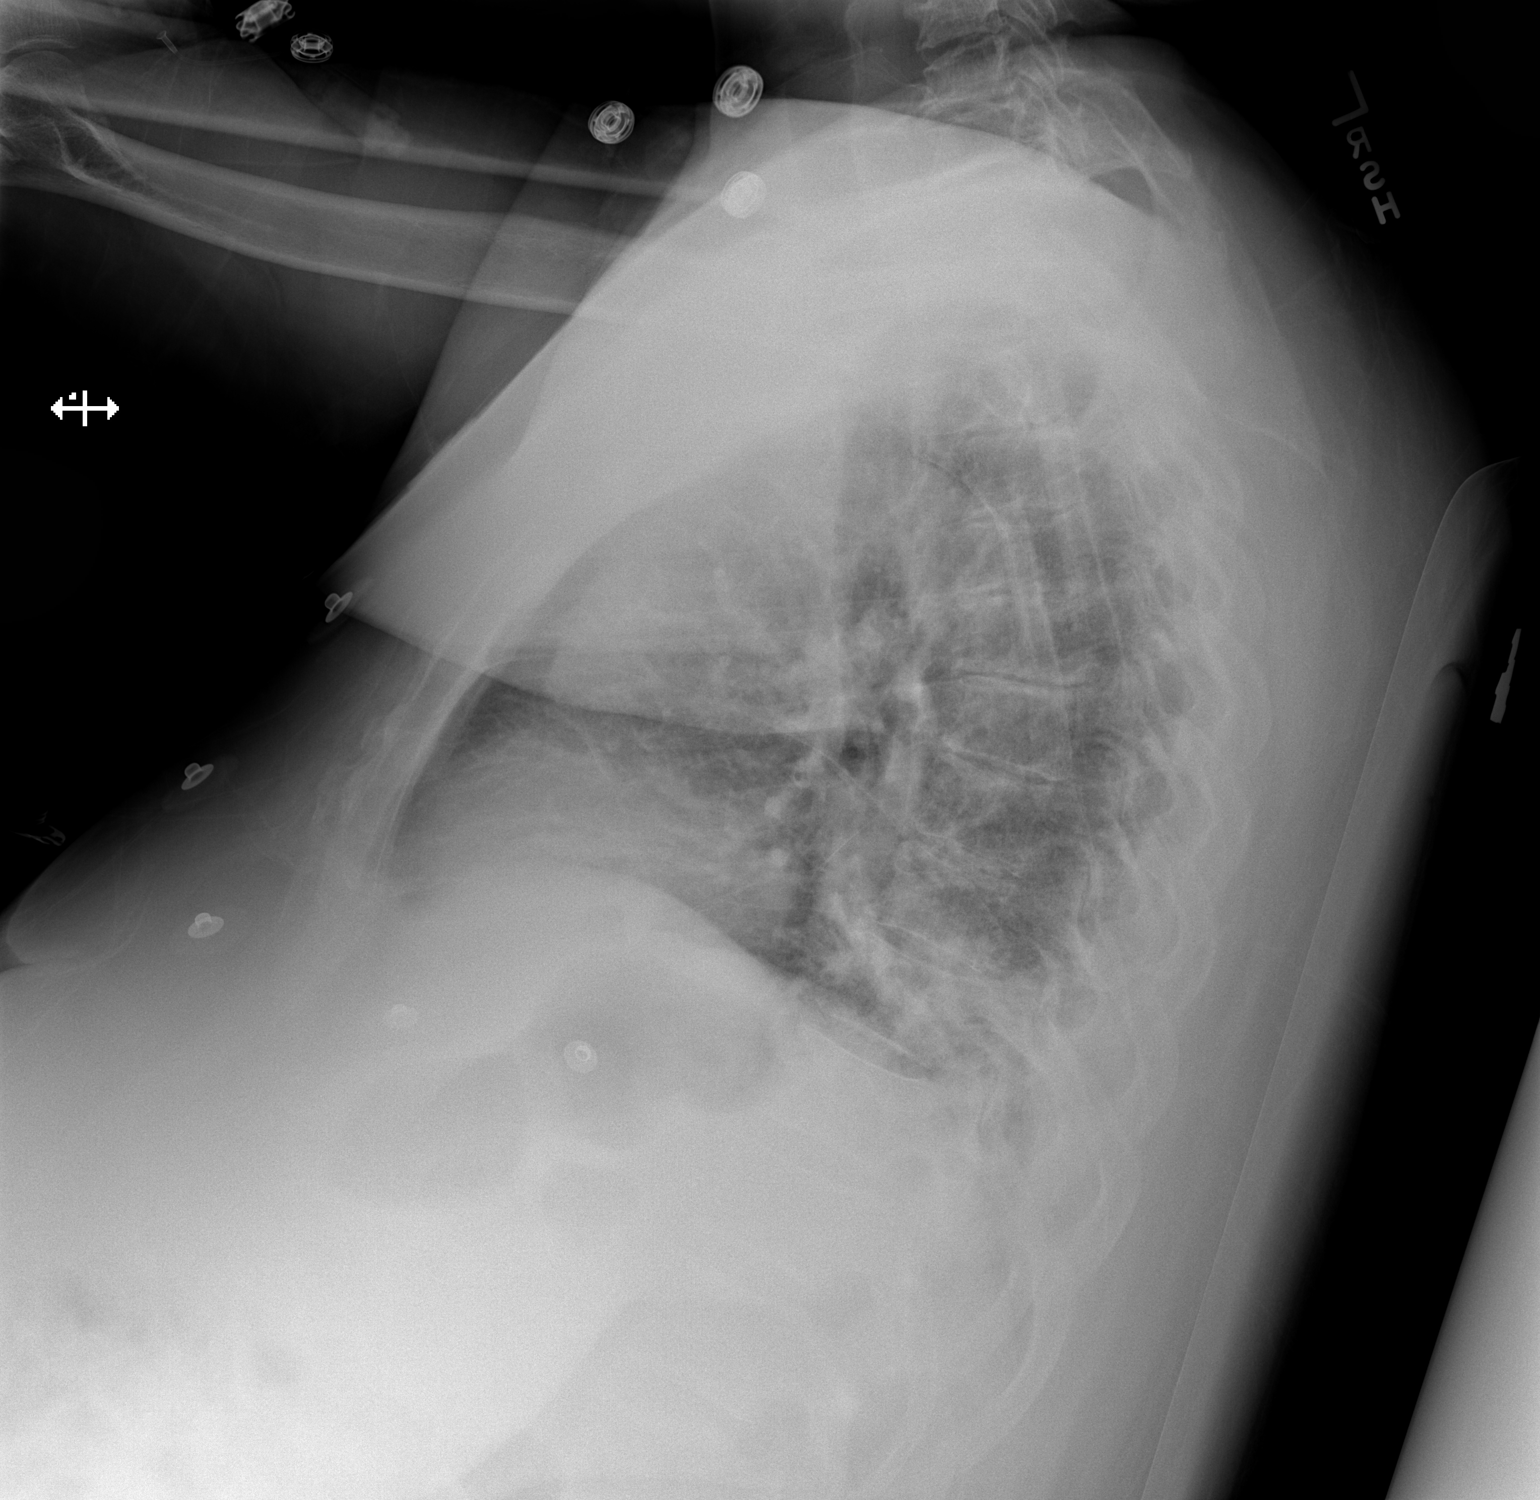

[2 of 2 positions shown; findings below may reference images not displayed]

FINDINGS: Mild cardiac enlargement is present. The lung volumes are low. Right
lower lobe airspace disease it is best seen on the lateral view
compatible with pneumonia. Mild pulmonary vascular congestion is
evident. A right pleural effusion is suspected. The visualized soft
tissues and bony thorax are unremarkable.
IMPRESSION: 1. Right lower lobe pneumonia.
2. Mild pulmonary vascular congestion probable right pleural
effusion.

## 2018-05-10 IMAGING — DX DG CHEST 2V
2 series · 2 of 2 positions shown · non-contrast
Comparison: 03/21/2016.  11/02/2013.

CLINICAL DATA: Follow-up pneumonia.

EXAM:
CHEST  2 VIEW

[chest pa]
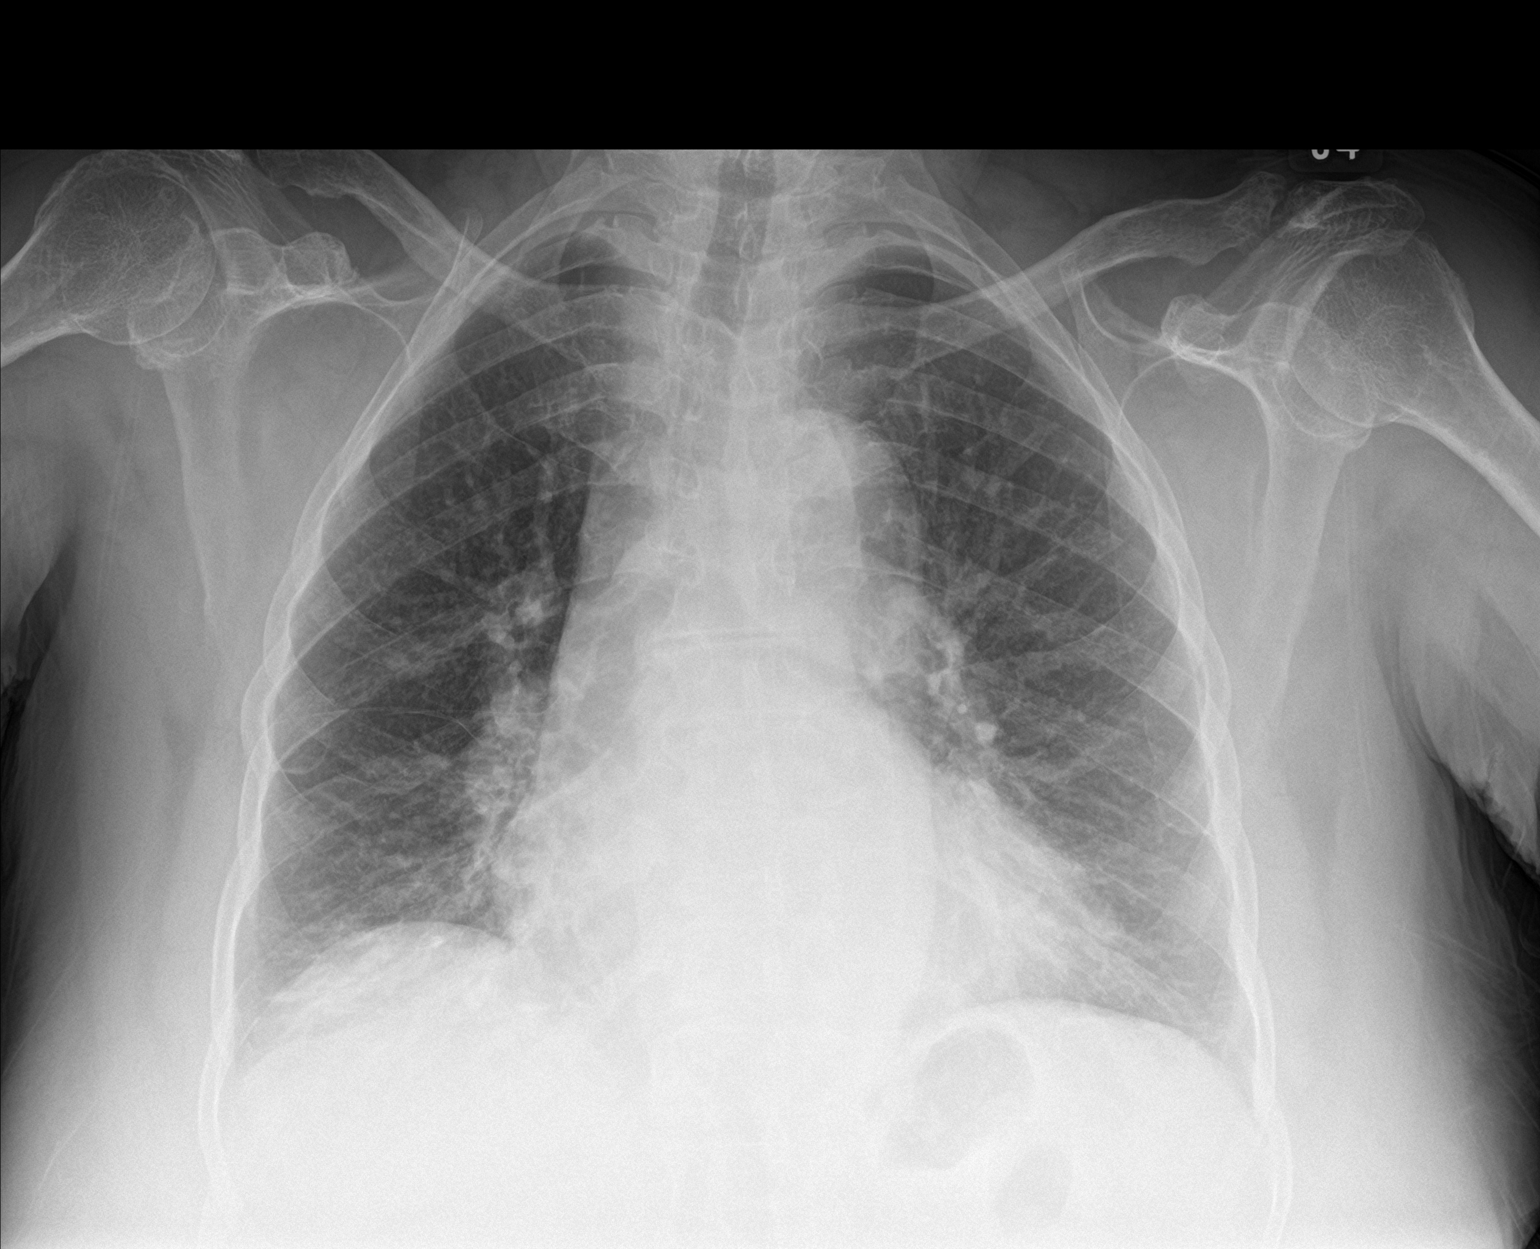

[chest lat]
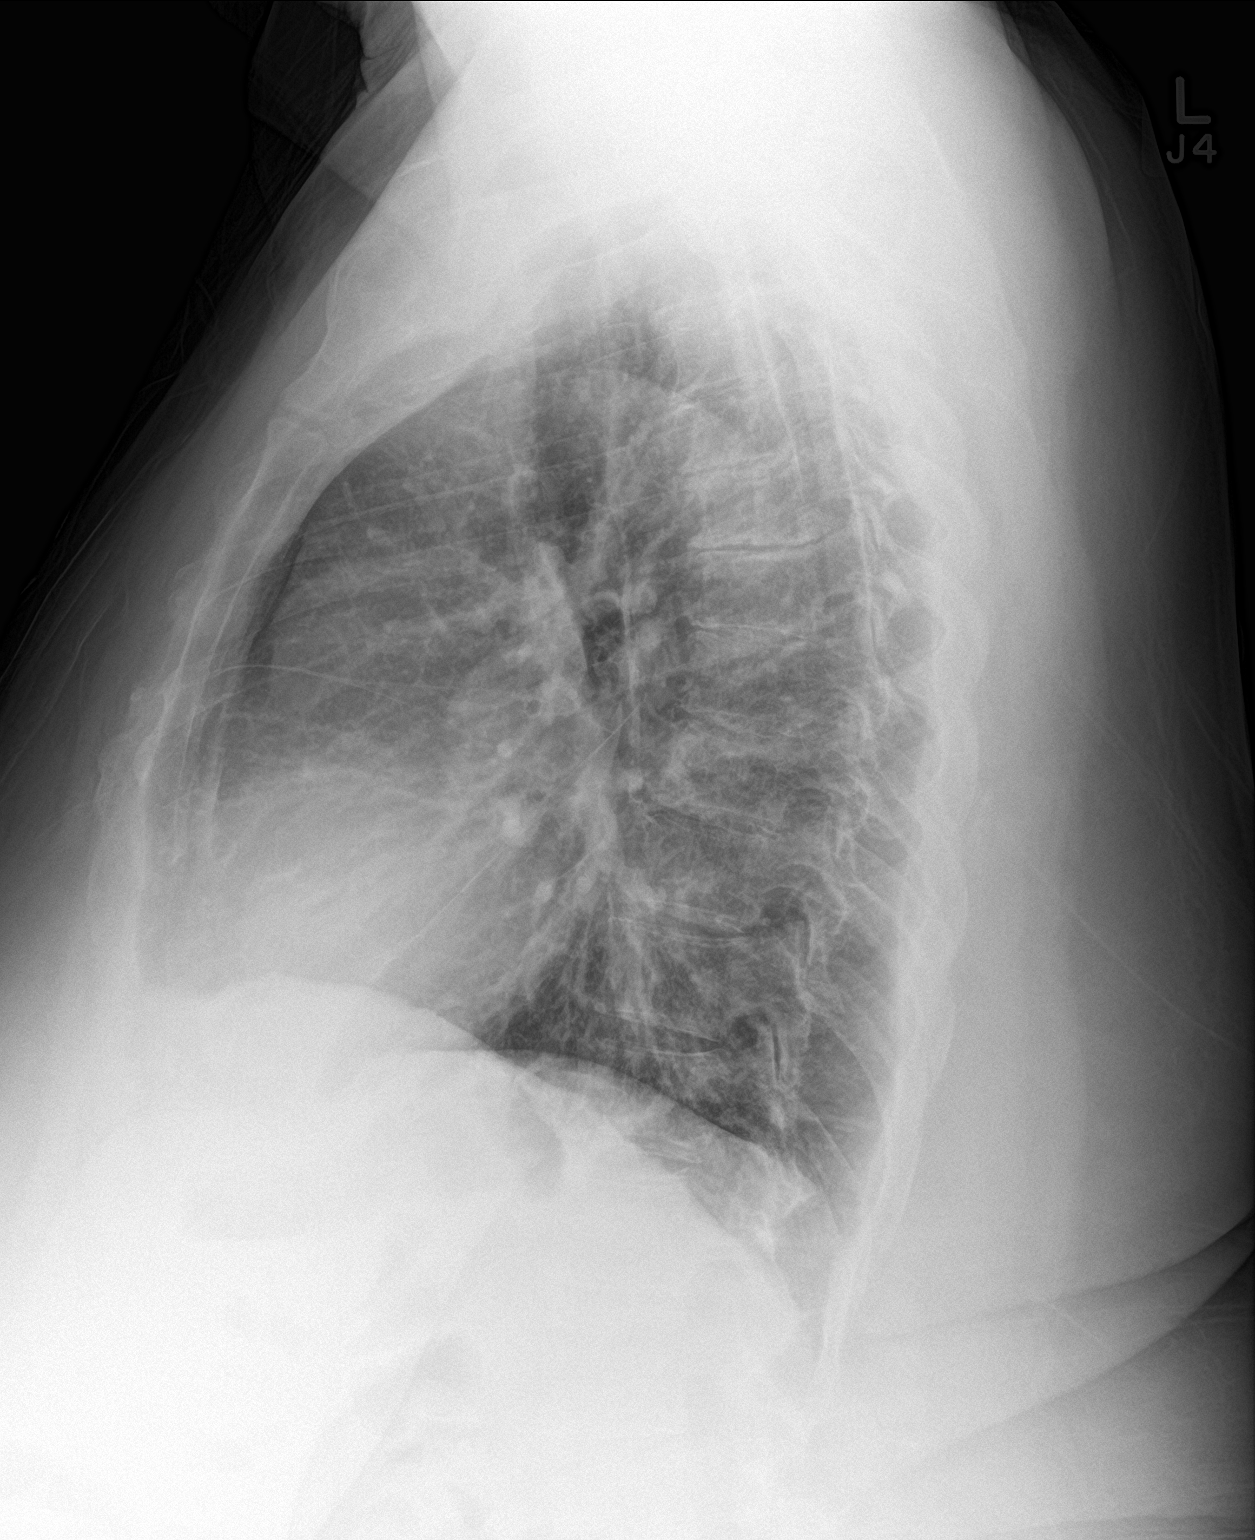

[2 of 2 positions shown; findings below may reference images not displayed]

FINDINGS: Mediastinum and hilar structures are unremarkable. Persistent mild
right lower lobe infiltrate. New right middle lobe infiltrate. No
pleural effusion or pneumothorax. Cardiomegaly with mild pulmonary
venous congestion. No prominent pleural effusion or pneumothorax P
IMPRESSION: 1. Persistent mild right lower lobe infiltrate and/or interstitial
edema. New right middle lobe infiltrate and/or interstitial edema.

2. Cardiomegaly with pulmonary venous congestion.

## 2018-06-08 ENCOUNTER — Ambulatory Visit: Payer: Medicare Other | Admitting: Internal Medicine

## 2018-06-18 ENCOUNTER — Ambulatory Visit (INDEPENDENT_AMBULATORY_CARE_PROVIDER_SITE_OTHER): Payer: Medicare Other | Admitting: Internal Medicine

## 2018-06-18 ENCOUNTER — Encounter: Payer: Self-pay | Admitting: Internal Medicine

## 2018-06-18 VITALS — BP 124/76 | HR 58 | Temp 98.6°F | Ht 63.0 in | Wt 232.0 lb

## 2018-06-18 DIAGNOSIS — I10 Essential (primary) hypertension: Secondary | ICD-10-CM

## 2018-06-18 DIAGNOSIS — E538 Deficiency of other specified B group vitamins: Secondary | ICD-10-CM | POA: Diagnosis not present

## 2018-06-18 DIAGNOSIS — G5603 Carpal tunnel syndrome, bilateral upper limbs: Secondary | ICD-10-CM | POA: Diagnosis not present

## 2018-06-18 MED ORDER — FUROSEMIDE 40 MG PO TABS: ORAL_TABLET | ORAL | 3 refills | Status: DC

## 2018-06-18 MED ORDER — POTASSIUM CHLORIDE ER 10 MEQ PO TBCR: EXTENDED_RELEASE_TABLET | ORAL | 3 refills | Status: DC

## 2018-06-18 MED ORDER — GABAPENTIN 100 MG PO CAPS: 100.0000 mg | ORAL_CAPSULE | Freq: Three times a day (TID) | ORAL | 1 refills | Status: DC

## 2018-06-18 MED ORDER — CYANOCOBALAMIN 1000 MCG/ML IJ SOLN
1000.0000 ug | Freq: Once | INTRAMUSCULAR | Status: AC
  Administered 2018-06-18: 1000 ug via INTRAMUSCULAR

## 2018-06-18 MED ORDER — LEVOTHYROXINE SODIUM 112 MCG PO TABS: 112.0000 ug | ORAL_TABLET | Freq: Every day | ORAL | 3 refills | Status: DC

## 2018-06-18 MED ORDER — ATENOLOL 100 MG PO TABS: 100.0000 mg | ORAL_TABLET | Freq: Every day | ORAL | 3 refills | Status: DC

## 2018-06-18 NOTE — Assessment & Plan Note (Signed)
stable overall by history and exam, recent data reviewed with pt, and pt to continue medical treatment as before,  to f/u any worsening symptoms or concerns  

## 2018-06-18 NOTE — Patient Instructions (Addendum)
OK to stop the B12 pills since they do not seem to work  You had the B12 shot today  Please make Nurse Visit appt for monthly B12 shots  OK to increase the thyroid medication to 112 mcg per day  Please continue all other medications as before, and refills have been done if requested.  Please have the pharmacy call with any other refills you may need.  Please continue your efforts at being more active, low cholesterol diet, and weight control.  You are otherwise up to date with prevention measures today.  Please keep your appointments with your specialists as you may have planned  Your form is given today

## 2018-06-18 NOTE — Assessment & Plan Note (Signed)
Ok for note for assist with course work

## 2018-06-18 NOTE — Assessment & Plan Note (Signed)
Has persistent low b12 despite good compliance with otc supplement, for b12 IM today, then monthly
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
## 2018-07-12 ENCOUNTER — Ambulatory Visit: Payer: Medicare Other | Admitting: Family Medicine

## 2018-07-12 ENCOUNTER — Encounter: Payer: Self-pay | Admitting: Family Medicine

## 2018-07-12 ENCOUNTER — Ambulatory Visit (INDEPENDENT_AMBULATORY_CARE_PROVIDER_SITE_OTHER)
Admission: RE | Admit: 2018-07-12 | Discharge: 2018-07-12 | Disposition: A | Discharge status: Home / Self Care | Payer: Medicare Other | Source: Ambulatory Visit | Attending: Family Medicine | Admitting: Family Medicine

## 2018-07-12 ENCOUNTER — Encounter

## 2018-07-12 VITALS — BP 134/82 | HR 60 | Ht 63.0 in | Wt 233.0 lb

## 2018-07-12 DIAGNOSIS — M545 Low back pain, unspecified: Secondary | ICD-10-CM

## 2018-07-12 DIAGNOSIS — M7061 Trochanteric bursitis, right hip: Secondary | ICD-10-CM | POA: Diagnosis not present

## 2018-07-12 NOTE — Assessment & Plan Note (Signed)
Patient given injection.  Tolerated the procedure well.  Discussed icing regimen and home exercise.  Discussed which activities to do which wants to avoid.  Patient is to increase activity slowly.  I do believe that there is likely still a little right-sided lumbar radiculopathy that is contributing.  Encourage patient to take the gabapentin on a more regular basis.  Will need to consider epidurals if no significant improvement.  Follow-up again in 4 weeks

## 2018-07-12 NOTE — Patient Instructions (Signed)
Good to see you  I am sorry you are hurting.  Ice 20 minutes 2 times daily. Usually after activity and before bed. pennsaid pinkie amount topically 2 times daily as needed.   Exercises 3 times a week.  Gabapentin 200mg  at night Xray of back downstairs See me again in 3-4 weeks

## 2018-07-17 ENCOUNTER — Ambulatory Visit: Payer: Medicare Other | Admitting: Family Medicine

## 2018-07-24 ENCOUNTER — Ambulatory Visit (INDEPENDENT_AMBULATORY_CARE_PROVIDER_SITE_OTHER): Payer: Medicare Other | Admitting: *Deleted

## 2018-07-24 DIAGNOSIS — Z23 Encounter for immunization: Secondary | ICD-10-CM

## 2018-07-24 DIAGNOSIS — E538 Deficiency of other specified B group vitamins: Secondary | ICD-10-CM

## 2018-07-24 MED ORDER — CYANOCOBALAMIN 1000 MCG/ML IJ SOLN
1000.0000 ug | Freq: Once | INTRAMUSCULAR | Status: AC
  Administered 2018-07-24: 1000 ug via INTRAMUSCULAR

## 2018-07-24 NOTE — Progress Notes (Signed)
Medical screening examination/treatment/procedure(s) were performed by non-physician practitioner and as supervising physician I was immediately available for consultation/collaboration. I agree with above. Rheta Hemmelgarn, MD   

## 2018-07-24 NOTE — Addendum Note (Signed)
Addended by: Merrilyn Puma on: 07/24/2018 08:09 AM   Modules accepted: Orders
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
## 2018-08-13 ENCOUNTER — Encounter: Payer: Self-pay | Admitting: Family Medicine

## 2018-08-13 ENCOUNTER — Ambulatory Visit: Payer: Medicare Other | Admitting: Family Medicine

## 2018-08-13 VITALS — BP 106/76 | HR 63 | Ht 63.0 in | Wt 229.0 lb

## 2018-08-13 DIAGNOSIS — M5416 Radiculopathy, lumbar region: Secondary | ICD-10-CM | POA: Diagnosis not present

## 2018-08-13 NOTE — Patient Instructions (Addendum)
God to see you  Sorry for being a little late today  MRI of the back ordered.  Call [phone] to schedule COntinue the medicinse  After we get MRi we will call you and discuss possible injections.

## 2018-08-13 NOTE — Assessment & Plan Note (Addendum)
Worsening symptoms overall.  MRI previously 4 years ago did show disc protrusion causing nerve root impingement.  Patient seems to be continuing to have the same pain.  We discussed icing regimen and home exercises.  We discussed which activities to doing which wants to avoid.  Patient is going to follow-up with me again after the MRI and will discuss the possibility of epidurals  Spent  25 minutes with patient face-to-face and had greater than 50% of counseling including as described above in assessment and plan.

## 2018-08-18 ENCOUNTER — Other Ambulatory Visit: Payer: Medicare Other

## 2018-08-19 ENCOUNTER — Ambulatory Visit
Admission: RE | Admit: 2018-08-19 | Discharge: 2018-08-19 | Disposition: A | Discharge status: Home / Self Care | Payer: Medicare Other | Source: Ambulatory Visit | Attending: Family Medicine | Admitting: Family Medicine

## 2018-08-19 DIAGNOSIS — M5416 Radiculopathy, lumbar region: Secondary | ICD-10-CM

## 2018-08-19 DIAGNOSIS — M48061 Spinal stenosis, lumbar region without neurogenic claudication: Secondary | ICD-10-CM | POA: Diagnosis not present

## 2018-08-27 ENCOUNTER — Ambulatory Visit (INDEPENDENT_AMBULATORY_CARE_PROVIDER_SITE_OTHER): Payer: Medicare Other

## 2018-08-27 DIAGNOSIS — E538 Deficiency of other specified B group vitamins: Secondary | ICD-10-CM | POA: Diagnosis not present

## 2018-08-27 MED ORDER — CYANOCOBALAMIN 1000 MCG/ML IJ SOLN
1000.0000 ug | Freq: Once | INTRAMUSCULAR | Status: AC
  Administered 2018-08-27: 1000 ug via INTRAMUSCULAR

## 2018-08-27 NOTE — Progress Notes (Signed)
Medical screening examination/treatment/procedure(s) were performed by non-physician practitioner and as supervising physician I was immediately available for consultation/collaboration. I agree with above. [name] John, MD   
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
## 2018-09-03 ENCOUNTER — Ambulatory Visit: Payer: Medicare Other | Admitting: Family Medicine

## 2018-09-03 ENCOUNTER — Encounter: Payer: Self-pay | Admitting: Family Medicine

## 2018-09-03 VITALS — BP 122/60 | HR 57 | Ht 63.0 in | Wt 229.0 lb

## 2018-09-03 DIAGNOSIS — M5416 Radiculopathy, lumbar region: Secondary | ICD-10-CM | POA: Diagnosis not present

## 2018-09-03 DIAGNOSIS — M47816 Spondylosis without myelopathy or radiculopathy, lumbar region: Secondary | ICD-10-CM

## 2018-09-03 NOTE — Assessment & Plan Note (Signed)
Patient did have more arthropathy of the facet joint.  Discussed icing regimen and home exercises.  Discussed after the potential facet injections the patient will come back to 3 weeks and we will start home exercises again.  Continue the gabapentin in the interim.  Patient is in agreement with the plan.  Spent  25 minutes with patient face-to-face and had greater than 50% of counseling including as described above in assessment and plan.

## 2018-09-03 NOTE — Patient Instructions (Signed)
Good to see you  I think the facet arthritis is likely causing the pain wyou describe Lets try the injections  Call [phone] and see how they go  Once you know when you have the injection then see me again in 3 weeks AFTER the injection  You will do great!

## 2018-09-14 ENCOUNTER — Ambulatory Visit
Admission: RE | Admit: 2018-09-14 | Discharge: 2018-09-14 | Disposition: A | Discharge status: Home / Self Care | Payer: Medicare Other | Source: Ambulatory Visit | Attending: Family Medicine | Admitting: Family Medicine

## 2018-09-14 DIAGNOSIS — M5416 Radiculopathy, lumbar region: Secondary | ICD-10-CM

## 2018-09-14 DIAGNOSIS — M545 Low back pain: Secondary | ICD-10-CM | POA: Diagnosis not present

## 2018-09-14 MED ORDER — METHYLPREDNISOLONE ACETATE 40 MG/ML INJ SUSP (RADIOLOG
120.0000 mg | Freq: Once | INTRAMUSCULAR | Status: AC
  Administered 2018-09-14: 120 mg via INTRA_ARTICULAR

## 2018-09-14 MED ORDER — IOPAMIDOL (ISOVUE-M 200) INJECTION 41%
1.0000 mL | Freq: Once | INTRAMUSCULAR | Status: AC
  Administered 2018-09-14: 1 mL via INTRA_ARTICULAR

## 2018-09-14 NOTE — Discharge Instructions (Signed)

## 2018-09-24 ENCOUNTER — Ambulatory Visit (INDEPENDENT_AMBULATORY_CARE_PROVIDER_SITE_OTHER): Payer: Medicare Other

## 2018-09-24 DIAGNOSIS — E538 Deficiency of other specified B group vitamins: Secondary | ICD-10-CM

## 2018-09-24 MED ORDER — CYANOCOBALAMIN 1000 MCG/ML IJ SOLN
1000.0000 ug | Freq: Once | INTRAMUSCULAR | Status: AC
  Administered 2018-09-24: 1000 ug via INTRAMUSCULAR

## 2018-09-24 NOTE — Progress Notes (Signed)
Medical screening examination/treatment/procedure(s) were performed by non-physician practitioner and as supervising physician I was immediately available for consultation/collaboration. I agree with above.  , MD   

## 2018-09-26 ENCOUNTER — Encounter: Payer: Self-pay | Admitting: Internal Medicine

## 2018-09-26 ENCOUNTER — Ambulatory Visit (INDEPENDENT_AMBULATORY_CARE_PROVIDER_SITE_OTHER): Payer: Medicare Other | Admitting: Internal Medicine

## 2018-09-26 ENCOUNTER — Other Ambulatory Visit (INDEPENDENT_AMBULATORY_CARE_PROVIDER_SITE_OTHER): Payer: Medicare Other

## 2018-09-26 VITALS — BP 134/86 | HR 62 | Temp 98.6°F | Ht 63.0 in | Wt 233.0 lb

## 2018-09-26 DIAGNOSIS — E785 Hyperlipidemia, unspecified: Secondary | ICD-10-CM

## 2018-09-26 DIAGNOSIS — N183 Chronic kidney disease, stage 3 unspecified: Secondary | ICD-10-CM

## 2018-09-26 DIAGNOSIS — E039 Hypothyroidism, unspecified: Secondary | ICD-10-CM

## 2018-09-26 DIAGNOSIS — E538 Deficiency of other specified B group vitamins: Secondary | ICD-10-CM

## 2018-09-26 DIAGNOSIS — R7302 Impaired glucose tolerance (oral): Secondary | ICD-10-CM | POA: Diagnosis not present

## 2018-09-26 DIAGNOSIS — I10 Essential (primary) hypertension: Secondary | ICD-10-CM | POA: Diagnosis not present

## 2018-09-26 LAB — CBC WITH DIFFERENTIAL/PLATELET
Basophils Absolute: 0 10*3/uL (ref 0.0–0.1)
Basophils Relative: 0.3 % (ref 0.0–3.0)
EOS PCT: 1.7 % (ref 0.0–5.0)
Eosinophils Absolute: 0.1 10*3/uL (ref 0.0–0.7)
HEMATOCRIT: 32.8 % — AB (ref 36.0–46.0)
Hemoglobin: 11 g/dL — ABNORMAL LOW (ref 12.0–15.0)
LYMPHS PCT: 28.4 % (ref 12.0–46.0)
Lymphs Abs: 1.5 10*3/uL (ref 0.7–4.0)
MCHC: 33.5 g/dL (ref 30.0–36.0)
MCV: 91.5 fl (ref 78.0–100.0)
MONOS PCT: 11.3 % (ref 3.0–12.0)
Monocytes Absolute: 0.6 10*3/uL (ref 0.1–1.0)
Neutro Abs: 3 10*3/uL (ref 1.4–7.7)
Neutrophils Relative %: 58.3 % (ref 43.0–77.0)
Platelets: 124 10*3/uL — ABNORMAL LOW (ref 150.0–400.0)
RBC: 3.58 Mil/uL — AB (ref 3.87–5.11)
RDW: 13.4 % (ref 11.5–15.5)
WBC: 5.1 10*3/uL (ref 4.0–10.5)

## 2018-09-26 LAB — BASIC METABOLIC PANEL
BUN: 23 mg/dL (ref 6–23)
CHLORIDE: 110 meq/L (ref 96–112)
CO2: 28 meq/L (ref 19–32)
Calcium: 9 mg/dL (ref 8.4–10.5)
Creatinine, Ser: 1.61 mg/dL — ABNORMAL HIGH (ref 0.40–1.20)
GFR: 39.9 mL/min — ABNORMAL LOW (ref >60.00)
GLUCOSE: 94 mg/dL (ref 70–99)
POTASSIUM: 4.3 meq/L (ref 3.5–5.1)
SODIUM: 144 meq/L (ref 135–145)

## 2018-09-26 LAB — HEPATIC FUNCTION PANEL
ALK PHOS: 52 U/L (ref 39–117)
ALT: 19 U/L (ref 0–35)
AST: 22 U/L (ref 0–37)
Albumin: 4 g/dL (ref 3.5–5.2)
Bilirubin, Direct: 0.1 mg/dL (ref 0.0–0.3)
TOTAL PROTEIN: 7.4 g/dL (ref 6.0–8.3)
Total Bilirubin: 0.4 mg/dL (ref 0.2–1.2)

## 2018-09-26 LAB — LIPID PANEL
Cholesterol: 167 mg/dL (ref 0–200)
HDL: 46.9 mg/dL (ref >39.00)
LDL Cholesterol: 100 mg/dL — ABNORMAL HIGH (ref 0–99)
NONHDL: 120.47
Total CHOL/HDL Ratio: 4
Triglycerides: 101 mg/dL (ref 0.0–149.0)
VLDL: 20.2 mg/dL (ref 0.0–40.0)

## 2018-09-26 LAB — TSH: TSH: 1.77 u[IU]/mL (ref 0.35–4.50)

## 2018-09-26 LAB — VITAMIN B12: Vitamin B-12: 1251 pg/mL — ABNORMAL HIGH (ref 211–911)

## 2018-09-26 LAB — T4, FREE: Free T4: 0.86 ng/dL (ref 0.60–1.60)

## 2018-09-26 LAB — HEMOGLOBIN A1C: HEMOGLOBIN A1C: 5.5 % (ref 4.6–6.5)

## 2018-09-26 MED ORDER — PRAVASTATIN SODIUM 20 MG PO TABS: 20.0000 mg | ORAL_TABLET | Freq: Every day | ORAL | 3 refills | Status: DC

## 2018-09-26 NOTE — Assessment & Plan Note (Signed)
stable overall by history and exam, recent data reviewed with pt, and pt to continue medical treatment as before,  to f/u any worsening symptoms or concerns  
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
## 2018-09-26 NOTE — Patient Instructions (Addendum)
Please take all new medication as prescribed - the pravastatin 20 mg per day  Please continue all other medications as before, and refills have been done if requested.  Please have the pharmacy call with any other refills you may need.  Please continue your efforts at being more active, low cholesterol diet, and weight control.  Please keep your appointments with your specialists as you may have planned  Please go to the LAB in the Basement (turn left off the elevator) for the tests to be done today  You will be contacted by phone if any changes need to be made immediately.  Otherwise, you will receive a letter about your results with an explanation, but please check with MyChart first.  Please return in 6 months, or sooner if needed, with Lab testing done 3-5 days before

## 2018-09-26 NOTE — Assessment & Plan Note (Addendum)
stable overall by history and exam, recent data reviewed with pt, and pt to add low dose pravastatin asd given goal LDL < 70,  to f/u any worsening symptoms or concerns

## 2018-10-08 ENCOUNTER — Telehealth: Payer: Self-pay

## 2018-10-08 DIAGNOSIS — Z01419 Encounter for gynecological examination (general) (routine) without abnormal findings: Secondary | ICD-10-CM

## 2018-10-08 NOTE — Addendum Note (Signed)
Addended by: Corwin LevinsJOHN, [name] W on: 10/08/2018 07:41 PM   Modules accepted: Orders

## 2018-10-08 NOTE — Telephone Encounter (Signed)
Ok this is done 

## 2018-10-08 NOTE — Telephone Encounter (Signed)
Copied from CRM [phone]. Topic: General - Other >> Oct 08, 2018  3:07 PM Stephannie LiSimmons, Janett L, NT wrote: Reason for CRM: The patient would like for Dr Jonny RuizJohn to give her a referral for a female gynecologist, in network ,she says she has not seen one since 1990, please call [phone]

## 2018-10-15 ENCOUNTER — Telehealth: Payer: Self-pay | Admitting: Internal Medicine

## 2018-10-15 NOTE — Telephone Encounter (Signed)
Per patient request, please reroute GYN referral to:   [facility]: Hal MoralesHaygood Vanessa P MD   [address] [place], [place], [place] [address]   Phone: [phone]

## 2018-10-22 NOTE — Telephone Encounter (Signed)
Left  msg on vm with Dr. Charleen KirksHaygoods scheduler to call back regarding referral on 12/3 & 12/9

## 2018-10-22 NOTE — Telephone Encounter (Signed)
Pt scheduled  
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
## 2018-10-25 ENCOUNTER — Encounter: Payer: Self-pay | Admitting: Obstetrics and Gynecology

## 2018-10-25 ENCOUNTER — Telehealth: Payer: Self-pay | Admitting: Certified Nurse Midwife

## 2018-10-25 ENCOUNTER — Encounter: Payer: Self-pay | Admitting: Family Medicine

## 2018-10-25 ENCOUNTER — Ambulatory Visit: Payer: Medicare Other | Admitting: Family Medicine

## 2018-10-25 VITALS — BP 130/80 | HR 60 | Ht 63.0 in | Wt 235.0 lb

## 2018-10-25 DIAGNOSIS — E538 Deficiency of other specified B group vitamins: Secondary | ICD-10-CM

## 2018-10-25 DIAGNOSIS — M5416 Radiculopathy, lumbar region: Secondary | ICD-10-CM

## 2018-10-25 MED ORDER — CYANOCOBALAMIN 1000 MCG/ML IJ SOLN
1000.0000 ug | Freq: Once | INTRAMUSCULAR | Status: AC
  Administered 2018-10-25: 1000 ug via INTRAMUSCULAR

## 2018-10-25 NOTE — Patient Instructions (Signed)
Good to see you  Ice is your friend Stay active I am happy you are better See me again in 6-8 weeks Happy holidays!

## 2018-10-25 NOTE — Assessment & Plan Note (Signed)
Patient does have a subacute left lumbar radiculopathy.  Facet arthropathy noted.  Has responded fairly well to facet and epidural injections.  Patient is feeling 75% better.  Continue conservative therapy.  No change medicines.  Follow-up with me again in 6 to 8 weeks

## 2018-10-25 NOTE — Telephone Encounter (Signed)
Called and left a message for patient to call back to schedule a new patient doctor referral appointment with our office to see any provider for: annual well-women exam.

## 2018-11-12 ENCOUNTER — Other Ambulatory Visit: Payer: Self-pay

## 2018-11-12 ENCOUNTER — Encounter: Payer: Self-pay | Admitting: Obstetrics and Gynecology

## 2018-11-12 ENCOUNTER — Ambulatory Visit (INDEPENDENT_AMBULATORY_CARE_PROVIDER_SITE_OTHER): Payer: Medicare Other | Admitting: Obstetrics and Gynecology

## 2018-11-12 VITALS — BP 124/78 | HR 60 | Resp 20 | Ht 62.0 in | Wt 232.2 lb

## 2018-11-12 DIAGNOSIS — N393 Stress incontinence (female) (male): Secondary | ICD-10-CM | POA: Diagnosis not present

## 2018-11-12 DIAGNOSIS — Z01419 Encounter for gynecological examination (general) (routine) without abnormal findings: Secondary | ICD-10-CM | POA: Diagnosis not present

## 2018-11-12 DIAGNOSIS — N760 Acute vaginitis: Secondary | ICD-10-CM

## 2018-11-12 DIAGNOSIS — B372 Candidiasis of skin and nail: Secondary | ICD-10-CM

## 2018-11-12 LAB — POCT URINALYSIS DIPSTICK
BILIRUBIN UA: NEGATIVE
Blood, UA: NEGATIVE
GLUCOSE UA: NEGATIVE
Ketones, UA: NEGATIVE
Nitrite, UA: NEGATIVE
Protein, UA: NEGATIVE
Urobilinogen, UA: NEGATIVE E.U./dL — AB
pH, UA: 6 (ref 5.0–8.0)

## 2018-11-12 MED ORDER — NYSTATIN 100000 UNIT/GM EX POWD: Freq: Two times a day (BID) | CUTANEOUS | 2 refills | Status: DC

## 2018-11-12 NOTE — Patient Instructions (Signed)
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
## 2018-11-13 ENCOUNTER — Ambulatory Visit (INDEPENDENT_AMBULATORY_CARE_PROVIDER_SITE_OTHER): Payer: Medicare Other | Admitting: Family Medicine

## 2018-11-13 ENCOUNTER — Encounter: Payer: Self-pay | Admitting: Family Medicine

## 2018-11-13 VITALS — BP 128/74 | HR 72 | Temp 98.8°F | Ht 63.0 in | Wt 232.0 lb

## 2018-11-13 DIAGNOSIS — J020 Streptococcal pharyngitis: Secondary | ICD-10-CM

## 2018-11-13 LAB — URINALYSIS, MICROSCOPIC ONLY

## 2018-11-13 LAB — URINE CULTURE

## 2018-11-13 LAB — VAGINITIS/VAGINOSIS, DNA PROBE
Candida Species: NEGATIVE
GARDNERELLA VAGINALIS: NEGATIVE
TRICHOMONAS VAG: NEGATIVE

## 2018-11-13 LAB — POCT RAPID STREP A (OFFICE): Rapid Strep A Screen: POSITIVE — AB

## 2018-11-13 MED ORDER — IPRATROPIUM BROMIDE 0.06 % NA SOLN: 2.0000 | Freq: Four times a day (QID) | NASAL | 0 refills | Status: DC

## 2018-11-13 MED ORDER — AMOXICILLIN 875 MG PO TABS: 875.0000 mg | ORAL_TABLET | Freq: Two times a day (BID) | ORAL | 0 refills | Status: DC

## 2018-11-13 MED ORDER — BENZONATATE 200 MG PO CAPS: 200.0000 mg | ORAL_CAPSULE | Freq: Two times a day (BID) | ORAL | 0 refills | Status: DC | PRN

## 2018-11-13 NOTE — Patient Instructions (Signed)
You have strep throat.   Start the atrovent and amoxicillin.  Start tessalon for your cough.  Please stay well hydrated.  You can take tylenol and/or motrin as needed for low grade fever and pain.  Please let me know if your symptoms worsen or fail to improve.  Take care, Dr Jimmey RalphParker

## 2018-11-13 NOTE — Progress Notes (Signed)
   Subjective:  [name] is a 77 y.o. female who presents today for same-day appointment with a chief complaint of sore throat.   HPI:  Sore Throat, Acute problem Started a few days. Worsened over that time. Associated with cough, sputum production.  Throat is been very painful.  She takes Tylenol regularly which seems to help with her throat pain.  No fevers or chills.  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 No other obvious alleviating or aggravating factors.   ROS: Per HPI  PMH: She reports that she has never smoked. She has never used smokeless tobacco. She reports that she does not drink alcohol or use drugs.  Objective:  Physical Exam: BP 128/74 (BP Location: Right Arm, Patient Position: Sitting, Cuff Size: Large)   Pulse 72   Temp 98.8 F (37.1 C) (Oral)   Ht 5\' 3"  (1.6 m)   Wt 232 lb (105.2 kg)   SpO2 96%   BMI 41.10 kg/m   Gen: NAD, resting comfortably HEENT: TMs clear.  OP erythematous with cobblestoning.  Nasal mucosa erythematous and boggy bilaterally. CV: RRR with no murmurs appreciated Pulm: NWOB, CTAB with no crackles, wheezes, or rhonchi  Results for orders placed or performed in visit on 11/13/18 (from the past 24 hour(s))  POCT rapid strep A     Status: Abnormal   Collection Time: 11/13/18 10:49 AM  Result Value Ref Range   Rapid Strep A Screen Positive (A) Negative    Assessment/Plan:  Strep pharyngitis Rapid strep positive.  Start course of amoxicillin.  Start Atrovent nasal spray for sinus congestion.  Also start Tessalon for cough.  Recommended good oral hydration.  She can continue taking Tylenol as needed for pain and fever.  Discussed reasons to return to care and seek emergent care.  Follow-up as needed.  Katina Degreealeb M. Jimmey RalphParker, MD 11/13/2018 11:00 AM

## 2018-12-05 ENCOUNTER — Telehealth: Payer: Self-pay

## 2018-12-05 DIAGNOSIS — N644 Mastodynia: Secondary | ICD-10-CM

## 2018-12-05 DIAGNOSIS — R102 Pelvic and perineal pain: Secondary | ICD-10-CM

## 2018-12-05 NOTE — Telephone Encounter (Signed)
Done erx 

## 2018-12-05 NOTE — Addendum Note (Signed)
Addended by: Corwin Levins on: 12/05/2018 12:58 PM   Modules accepted: Orders

## 2018-12-05 NOTE — Telephone Encounter (Signed)
Copied from CRM [id number]. Topic: Referral - Request for Referral >> Dec 05, 2018 12:12 PM Wyonia HoughJohnson, Chaz E wrote: Has patient seen PCP for this complaint? No  *If NO, is insurance requiring patient see PCP for this issue before PCP can refer them? Referral for which specialty: OBGYN Pref[facility] provider/[facility]  Reason for referral: Breast issues and pain in lower groin area.

## 2018-12-06 ENCOUNTER — Ambulatory Visit: Payer: Medicare Other | Admitting: Family Medicine

## 2018-12-06 DIAGNOSIS — H35362 Drusen (degenerative) of macula, left eye: Secondary | ICD-10-CM | POA: Diagnosis not present

## 2018-12-06 DIAGNOSIS — H40023 Open angle with borderline findings, high risk, bilateral: Secondary | ICD-10-CM | POA: Diagnosis not present

## 2018-12-06 DIAGNOSIS — E119 Type 2 diabetes mellitus without complications: Secondary | ICD-10-CM | POA: Diagnosis not present

## 2018-12-06 DIAGNOSIS — H353111 Nonexudative age-related macular degeneration, right eye, early dry stage: Secondary | ICD-10-CM | POA: Diagnosis not present

## 2018-12-06 LAB — HM DIABETES EYE EXAM
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
## 2018-12-14 ENCOUNTER — Encounter: Payer: Self-pay | Admitting: Family Medicine

## 2018-12-14 ENCOUNTER — Ambulatory Visit: Payer: Medicare Other | Admitting: Family Medicine

## 2018-12-14 VITALS — BP 122/74 | HR 75 | Ht 63.0 in | Wt 231.0 lb

## 2018-12-14 DIAGNOSIS — M47816 Spondylosis without myelopathy or radiculopathy, lumbar region: Secondary | ICD-10-CM

## 2018-12-14 DIAGNOSIS — E559 Vitamin D deficiency, unspecified: Secondary | ICD-10-CM | POA: Diagnosis not present

## 2018-12-14 DIAGNOSIS — G894 Chronic pain syndrome: Secondary | ICD-10-CM | POA: Diagnosis not present

## 2018-12-14 DIAGNOSIS — M5416 Radiculopathy, lumbar region: Secondary | ICD-10-CM

## 2018-12-14 DIAGNOSIS — E538 Deficiency of other specified B group vitamins: Secondary | ICD-10-CM | POA: Diagnosis not present

## 2018-12-14 MED ORDER — CYANOCOBALAMIN 1000 MCG/ML IJ SOLN
1000.0000 ug | Freq: Once | INTRAMUSCULAR | Status: AC
  Administered 2018-12-14: 1000 ug via INTRAMUSCULAR

## 2018-12-14 MED ORDER — VITAMIN D (ERGOCALCIFEROL) 1.25 MG (50000 UNIT) PO CAPS: 50000.0000 [IU] | ORAL_CAPSULE | ORAL | 0 refills | Status: DC

## 2018-12-14 NOTE — Patient Instructions (Addendum)
Good to see you  Take the gabapentin 2 times a day  Continue the fosamax for now but we will get you a new medicine called prolia.  Also start vitamin D once a week and sent into your pharmacy  We order another injection in your back.  Please call [phone] to set up  Then see me again in 3 weeks AFTER the injection and we will see how you are doing.  Wonderful to see you

## 2018-12-14 NOTE — Assessment & Plan Note (Signed)
Started once weekly vitamin D 

## 2018-12-14 NOTE — Assessment & Plan Note (Signed)
Moderate to severe arthropathy mostly of the facet joints.  I am concerned the patient is having worsening symptoms at this point.  Seems to be more on the left side.  Patient is going to give injection today.  Patient is continuing to follow-up with her other providers for other ailments.  Do not know if these are not potentially contributing to some of the left hip, groin pain as well.  Seems to be more secondary to the back in my opinion.  Patient is responded previously to an injection 3 months ago and hopefully will do the same on this contralateral side.  Patient is having difficulty remembering to take her Fosamax and will try to change her to Prolia which could be easier.  Started once weekly vitamin D as well with patient having chronic kidney disease could be causing some more of her aches and pains.  Follow-up with me in 3 weeks after the injection.  Spent  25 minutes with patient face-to-face and had greater than 50% of counseling including as described above in assessment and plan.

## 2019-02-04 DIAGNOSIS — N952 Postmenopausal atrophic vaginitis: Secondary | ICD-10-CM | POA: Diagnosis not present

## 2019-02-05 ENCOUNTER — Telehealth: Payer: Self-pay | Admitting: Internal Medicine

## 2019-02-05 MED ORDER — GABAPENTIN 100 MG PO CAPS: 100.0000 mg | ORAL_CAPSULE | Freq: Three times a day (TID) | ORAL | 1 refills | Status: DC

## 2019-02-05 NOTE — Telephone Encounter (Signed)
Copied from CRM [id number]. Topic: Quick Communication - Rx Refill/Question >> Feb 05, 2019  2:08 PM Percival Spanish wrote: Medication gabapentin (NEURONTIN) 100 MG capsule   Preferred Pharmacy  [facility] [place]    Agent: Please be advised that RX refills may take up to 3 business days. We ask that you follow-up with your pharmacy.

## 2019-02-11 ENCOUNTER — Telehealth: Payer: Self-pay

## 2019-02-11 MED ORDER — FEXOFENADINE HCL 180 MG PO TABS: 180.0000 mg | ORAL_TABLET | Freq: Every day | ORAL | 3 refills | Status: DC

## 2019-02-11 NOTE — Addendum Note (Signed)
Addended by: Corwin Levins on: 02/11/2019 04:58 PM   Modules accepted: Orders

## 2019-02-11 NOTE — Telephone Encounter (Signed)
Patient states dr Jonny Ruiz has advised that she use Zyrtec for allergies, but she states they are too expensive OTC---routing to dr Jonny Ruiz, is there something you can call in/prescribe for patient for allergies that will be cheaper----please advise, I will call patient back, thanks

## 2019-02-11 NOTE — Telephone Encounter (Signed)
Ok for [employer] prn - done erx

## 2019-02-12 ENCOUNTER — Ambulatory Visit: Payer: Medicare Other

## 2019-02-12 NOTE — Telephone Encounter (Signed)
Routing back to shirron, no I did not get this message before I left yesterday, can you please let her know, thanks

## 2019-02-12 NOTE — Telephone Encounter (Signed)
Called pot, LVM with details below

## 2019-03-20 ENCOUNTER — Telehealth: Payer: Self-pay

## 2019-03-20 NOTE — Telephone Encounter (Signed)
Routing to dr Jonny Ruiz, patient is scheduled for follow up visit with dr Jonny Ruiz on 03/27/19---are you ok if patient is seen in person for office visit---she is also needing b12 injection----if you prefer to have virtual visit with her on that day, I can arrange to give b12 injection in parking lot----please advise, thanks

## 2019-03-20 NOTE — Telephone Encounter (Signed)
Patient advised.

## 2019-03-20 NOTE — Telephone Encounter (Signed)
Should be ok for in person, as we are now seeing 1 patient in person per hour per doctor, thanks

## 2019-03-27 ENCOUNTER — Encounter: Payer: Self-pay | Admitting: Internal Medicine

## 2019-03-27 ENCOUNTER — Other Ambulatory Visit: Payer: Self-pay

## 2019-03-27 ENCOUNTER — Telehealth: Payer: Self-pay

## 2019-03-27 ENCOUNTER — Ambulatory Visit (INDEPENDENT_AMBULATORY_CARE_PROVIDER_SITE_OTHER): Payer: Medicare Other | Admitting: Internal Medicine

## 2019-03-27 ENCOUNTER — Other Ambulatory Visit: Payer: Self-pay | Admitting: Internal Medicine

## 2019-03-27 ENCOUNTER — Other Ambulatory Visit (INDEPENDENT_AMBULATORY_CARE_PROVIDER_SITE_OTHER): Payer: Medicare Other

## 2019-03-27 VITALS — BP 134/80 | HR 65 | Temp 98.0°F | Ht 63.0 in | Wt 239.0 lb

## 2019-03-27 DIAGNOSIS — E538 Deficiency of other specified B group vitamins: Secondary | ICD-10-CM | POA: Diagnosis not present

## 2019-03-27 DIAGNOSIS — J309 Allergic rhinitis, unspecified: Secondary | ICD-10-CM | POA: Diagnosis not present

## 2019-03-27 DIAGNOSIS — Z0001 Encounter for general adult medical examination with abnormal findings: Secondary | ICD-10-CM

## 2019-03-27 DIAGNOSIS — Z0000 Encounter for general adult medical examination without abnormal findings: Secondary | ICD-10-CM | POA: Diagnosis not present

## 2019-03-27 DIAGNOSIS — N183 Chronic kidney disease, stage 3 unspecified: Secondary | ICD-10-CM

## 2019-03-27 DIAGNOSIS — M6530 Trigger finger, unspecified finger: Secondary | ICD-10-CM | POA: Diagnosis not present

## 2019-03-27 DIAGNOSIS — H9193 Unspecified hearing loss, bilateral: Secondary | ICD-10-CM

## 2019-03-27 DIAGNOSIS — R7302 Impaired glucose tolerance (oral): Secondary | ICD-10-CM | POA: Diagnosis not present

## 2019-03-27 DIAGNOSIS — I10 Essential (primary) hypertension: Secondary | ICD-10-CM | POA: Diagnosis not present

## 2019-03-27 LAB — CBC WITH DIFFERENTIAL/PLATELET
Basophils Absolute: 0 10*3/uL (ref 0.0–0.1)
Basophils Relative: 0.2 % (ref 0.0–3.0)
Eosinophils Absolute: 0 10*3/uL (ref 0.0–0.7)
Eosinophils Relative: 1 % (ref 0.0–5.0)
HCT: 33.1 % — ABNORMAL LOW (ref 36.0–46.0)
Hemoglobin: 11.2 g/dL — ABNORMAL LOW (ref 12.0–15.0)
Lymphocytes Relative: 29.5 % (ref 12.0–46.0)
Lymphs Abs: 1.4 10*3/uL (ref 0.7–4.0)
MCHC: 33.7 g/dL (ref 30.0–36.0)
MCV: 87.8 fl (ref 78.0–100.0)
Monocytes Absolute: 0.6 10*3/uL (ref 0.1–1.0)
Monocytes Relative: 13 % — ABNORMAL HIGH (ref 3.0–12.0)
Neutro Abs: 2.7 10*3/uL (ref 1.4–7.7)
Neutrophils Relative %: 56.3 % (ref 43.0–77.0)
Platelets: 127 10*3/uL — ABNORMAL LOW (ref 150.0–400.0)
RBC: 3.76 Mil/uL — ABNORMAL LOW (ref 3.87–5.11)
RDW: 14.8 % (ref 11.5–15.5)
WBC: 4.9 10*3/uL (ref 4.0–10.5)

## 2019-03-27 LAB — HEMOGLOBIN A1C: Hgb A1c MFr Bld: 5.7 % (ref 4.6–6.5)

## 2019-03-27 LAB — HEPATIC FUNCTION PANEL
ALT: 16 U/L (ref 0–35)
AST: 24 U/L (ref 0–37)
Albumin: 3.9 g/dL (ref 3.5–5.2)
Alkaline Phosphatase: 57 U/L (ref 39–117)
Bilirubin, Direct: 0.1 mg/dL (ref 0.0–0.3)
Total Bilirubin: 0.4 mg/dL (ref 0.2–1.2)
Total Protein: 7.5 g/dL (ref 6.0–8.3)

## 2019-03-27 LAB — URINALYSIS, ROUTINE W REFLEX MICROSCOPIC
Bilirubin Urine: NEGATIVE
Ketones, ur: NEGATIVE
Nitrite: NEGATIVE
Specific Gravity, Urine: 1.02 (ref 1.000–1.030)
Total Protein, Urine: NEGATIVE
Urine Glucose: NEGATIVE
Urobilinogen, UA: 1 (ref 0.0–1.0)
pH: 6 (ref 5.0–8.0)

## 2019-03-27 LAB — LIPID PANEL
Cholesterol: 151 mg/dL (ref 0–200)
HDL: 45.9 mg/dL (ref >39.00)
LDL Cholesterol: 85 mg/dL (ref 0–99)
NonHDL: 104.77
Total CHOL/HDL Ratio: 3
Triglycerides: 100 mg/dL (ref 0.0–149.0)
VLDL: 20 mg/dL (ref 0.0–40.0)

## 2019-03-27 LAB — BASIC METABOLIC PANEL
BUN: 17 mg/dL (ref 6–23)
CO2: 29 mEq/L (ref 19–32)
Calcium: 9.1 mg/dL (ref 8.4–10.5)
Chloride: 106 mEq/L (ref 96–112)
Creatinine, Ser: 1.57 mg/dL — ABNORMAL HIGH (ref 0.40–1.20)
GFR: 38.6 mL/min — ABNORMAL LOW (ref >60.00)
Glucose, Bld: 85 mg/dL (ref 70–99)
Potassium: 4.5 mEq/L (ref 3.5–5.1)
Sodium: 142 mEq/L (ref 135–145)

## 2019-03-27 LAB — MICROALBUMIN / CREATININE URINE RATIO
Creatinine,U: 103.9 mg/dL
Microalb Creat Ratio: 0.8 mg/g (ref 0.0–30.0)
Microalb, Ur: 0.8 mg/dL (ref 0.0–1.9)

## 2019-03-27 LAB — TSH: TSH: 4.16 u[IU]/mL (ref 0.35–4.50)

## 2019-03-27 MED ORDER — CYANOCOBALAMIN 1000 MCG/ML IJ SOLN
1000.0000 ug | Freq: Once | INTRAMUSCULAR | Status: AC
  Administered 2019-03-27: 1000 ug via INTRAMUSCULAR

## 2019-03-27 MED ORDER — CEPHALEXIN 500 MG PO CAPS: 500.0000 mg | ORAL_CAPSULE | Freq: Three times a day (TID) | ORAL | 0 refills | Status: AC

## 2019-03-27 NOTE — Telephone Encounter (Signed)
Called pt, LVM.   CRM created.  

## 2019-03-27 NOTE — Assessment & Plan Note (Signed)
stable overall by history and exam, recent data reviewed with pt, and pt to continue medical treatment as before,  to f/u any worsening symptoms or concerns, for a1c with labs 

## 2019-03-27 NOTE — Assessment & Plan Note (Addendum)
With mild worsening left > right, declines hand surgury or other voltaren gel for now,  to f/u any worsening symptoms or concerns  In addition to the time spent performing CPE, I spent an additional 25 minutes face to face,in which greater than 50% of this time was spent in counseling and coordination of care for patient's acute illness as documented, including the differential dx, treatment, further evaluation and other management of bilateral trigger fingers, hyperglycemia, HTN, CKD, allergic rhinitis, bilateral hearing loss, and b12 deficieicny

## 2019-03-27 NOTE — Assessment & Plan Note (Signed)
Overall stable, no need for med tx change at this time, to restart allegra and consider OTC nasacort if not improved

## 2019-03-27 NOTE — Assessment & Plan Note (Signed)
stable overall by history and exam, recent data reviewed with pt, and pt to continue medical treatment as before,  to f/u any worsening symptoms or concerns, goal BP < 130/80

## 2019-03-27 NOTE — Assessment & Plan Note (Signed)
Appears by exam to be c/w sensorineural hearing loss, no wax impaction, allergies or other noted, pt urged to see about possible defective aids vs improved use somehow

## 2019-03-27 NOTE — Telephone Encounter (Signed)
-----   Message from Corwin Levins, MD sent at 03/27/2019 12:47 PM EDT ----- Left message on MyChart, pt to cont same tx except   The test results show that your current treatment is OK, except the urine testing is consistent with a probable early urinary tract infection.  We will send an antibiotic, and you should hear from the office as well.Lendell Caprice to please inform pt, I will do rx

## 2019-03-27 NOTE — Patient Instructions (Addendum)
You had the B12 shot today  Please continue all other medications as before, and refills have been done if requested.  Please have the pharmacy call with any other refills you may need.  Please continue your efforts at being more active, low cholesterol diet, and weight control.  You are otherwise up to date with prevention measures today.  Please keep your appointments with your specialists as you may have planned  Please go to the LAB in the Basement (turn left off the elevator) for the tests to be done today  You will be contacted by phone if any changes need to be made immediately.  Otherwise, you will receive a letter about your results with an explanation, but please check with MyChart first.  Please remember to sign up for MyChart if you have not done so, as this will be important to you in the future with finding out test results, communicating by private email, and scheduling acute appointments online when needed.  Please return in 6 months, or sooner if needed, with Lab testing done 3-5 days before  Please call if you would like a referral to the hand surgeon for the trigger fingers

## 2019-03-27 NOTE — Assessment & Plan Note (Signed)
stable overall by history and exam, recent data reviewed with pt, and pt to continue medical treatment as before,  to f/u any worsening symptoms or concerns, for renal labs today included

## 2019-03-27 NOTE — Assessment & Plan Note (Signed)

## 2019-03-27 NOTE — Assessment & Plan Note (Signed)
Also for B12 IM 1000 mg today, has missed several recently due to the pandemic
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
## 2019-04-04 DIAGNOSIS — N952 Postmenopausal atrophic vaginitis: Secondary | ICD-10-CM | POA: Diagnosis not present

## 2019-04-26 DIAGNOSIS — I129 Hypertensive chronic kidney disease with stage 1 through stage 4 chronic kidney disease, or unspecified chronic kidney disease: Secondary | ICD-10-CM | POA: Diagnosis not present

## 2019-04-26 DIAGNOSIS — N183 Chronic kidney disease, stage 3 (moderate): Secondary | ICD-10-CM | POA: Diagnosis not present

## 2019-04-26 DIAGNOSIS — E1122 Type 2 diabetes mellitus with diabetic chronic kidney disease: Secondary | ICD-10-CM | POA: Diagnosis not present

## 2019-04-29 DIAGNOSIS — N952 Postmenopausal atrophic vaginitis: Secondary | ICD-10-CM | POA: Diagnosis not present

## 2019-05-01 ENCOUNTER — Ambulatory Visit (INDEPENDENT_AMBULATORY_CARE_PROVIDER_SITE_OTHER): Payer: Medicare Other

## 2019-05-01 DIAGNOSIS — E538 Deficiency of other specified B group vitamins: Secondary | ICD-10-CM | POA: Diagnosis not present

## 2019-05-01 MED ORDER — CYANOCOBALAMIN 1000 MCG/ML IJ SOLN
1000.0000 ug | Freq: Once | INTRAMUSCULAR | Status: AC
  Administered 2019-05-01: 1000 ug via INTRAMUSCULAR

## 2019-05-01 NOTE — Progress Notes (Signed)
Medical screening examination/treatment/procedure(s) were performed by non-physician practitioner and as supervising physician I was immediately available for consultation/collaboration. I agree with above. [name] John, MD   

## 2019-05-31 ENCOUNTER — Ambulatory Visit (INDEPENDENT_AMBULATORY_CARE_PROVIDER_SITE_OTHER): Payer: Medicare Other | Admitting: *Deleted

## 2019-05-31 DIAGNOSIS — E538 Deficiency of other specified B group vitamins: Secondary | ICD-10-CM

## 2019-05-31 MED ORDER — CYANOCOBALAMIN 1000 MCG/ML IJ SOLN
1000.0000 ug | Freq: Once | INTRAMUSCULAR | Status: AC
  Administered 2019-05-31: 1000 ug via INTRAMUSCULAR

## 2019-06-10 NOTE — Progress Notes (Signed)
Medical screening examination/treatment/procedure(s) were performed by non-physician practitioner and as supervising physician I was immediately available for consultation/collaboration. I agree with above. Lejend Dalby, MD   

## 2019-06-17 ENCOUNTER — Telehealth: Payer: Self-pay | Admitting: Emergency Medicine

## 2019-06-17 NOTE — Telephone Encounter (Signed)
Is this okay to be in office, or should it be virtual?

## 2019-06-17 NOTE — Telephone Encounter (Signed)
Copied from [place] [id number]. Topic: Appointment Scheduling - Scheduling Inquiry for Clinic >> Jun 17, 2019  7:48 AM Rayann Heman wrote: Reason for CRM: pt called and stated that she would like to schedule an appointment for allergies. Nasal congestion. Please advise

## 2019-06-17 NOTE — Telephone Encounter (Signed)
[facility] for inperson if no fever or sob

## 2019-06-17 NOTE — Telephone Encounter (Signed)
appt scheduled

## 2019-06-18 ENCOUNTER — Other Ambulatory Visit: Payer: Self-pay

## 2019-06-18 ENCOUNTER — Ambulatory Visit (INDEPENDENT_AMBULATORY_CARE_PROVIDER_SITE_OTHER): Payer: Medicare Other | Admitting: Internal Medicine

## 2019-06-18 ENCOUNTER — Encounter: Payer: Self-pay | Admitting: Internal Medicine

## 2019-06-18 VITALS — BP 144/70 | HR 81 | Temp 98.2°F | Resp 16 | Ht 63.0 in | Wt 236.0 lb

## 2019-06-18 DIAGNOSIS — I10 Essential (primary) hypertension: Secondary | ICD-10-CM | POA: Diagnosis not present

## 2019-06-18 DIAGNOSIS — Z20822 Contact with and (suspected) exposure to covid-19: Secondary | ICD-10-CM | POA: Insufficient documentation

## 2019-06-18 DIAGNOSIS — J309 Allergic rhinitis, unspecified: Secondary | ICD-10-CM | POA: Diagnosis not present

## 2019-06-18 DIAGNOSIS — Z20828 Contact with and (suspected) exposure to other viral communicable diseases: Secondary | ICD-10-CM | POA: Diagnosis not present

## 2019-06-18 DIAGNOSIS — R6889 Other general symptoms and signs: Secondary | ICD-10-CM | POA: Diagnosis not present

## 2019-06-18 NOTE — Patient Instructions (Signed)
Ok to head to the [facility] for COVID testing  Please continue all other medications as before, and refills have been done if requested.  Please have the pharmacy call with any other refills you may need.  Please continue your efforts at being more active, low cholesterol diet, and weight control.  Please keep your appointments with your specialists as you may have planned  You are given the work note today
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
## 2019-06-18 NOTE — Assessment & Plan Note (Signed)
stable overall by history and exam, recent data reviewed with pt, and pt to continue medical treatment as before,  to f/u any worsening symptoms or concerns  

## 2019-06-18 NOTE — Assessment & Plan Note (Signed)
Mild to mod, for restart allegra, to f/u any worsening symptoms or concerns

## 2019-06-18 NOTE — Assessment & Plan Note (Signed)
For COVID testing,  to f/u any worsening symptoms or concerns

## 2019-06-19 LAB — NOVEL CORONAVIRUS, NAA: SARS-CoV-2, NAA: NOT DETECTED

## 2019-06-20 ENCOUNTER — Telehealth: Payer: Self-pay | Admitting: Internal Medicine

## 2019-06-20 ENCOUNTER — Telehealth: Payer: Self-pay

## 2019-06-20 ENCOUNTER — Encounter: Payer: Self-pay | Admitting: Internal Medicine

## 2019-06-20 NOTE — Telephone Encounter (Signed)
-----   Message from Biagio Borg, MD sent at 06/19/2019  8:17 PM EDT ----- Left message on MyChart, pt to cont same tx  [place] to please inform pt, COVID is neg

## 2019-06-20 NOTE — Telephone Encounter (Signed)
Relation to pt: self  Call back number: [phone]    Reason for call:  Patient requesting return back to work Dr. Radene Ou reflecting 06/24/2019, patient would like to pick up letter, please advise

## 2019-06-20 NOTE — Telephone Encounter (Signed)
Called pt, LVM.   CRM created.  

## 2019-06-20 NOTE — Telephone Encounter (Signed)
granddaughter called - she is aware of negative covid result, she saw it on my chart.  No cb needed

## 2019-06-20 NOTE — Telephone Encounter (Signed)
Letter sent, cont same tx

## 2019-06-21 ENCOUNTER — Telehealth: Payer: Self-pay | Admitting: Emergency Medicine

## 2019-06-21 MED ORDER — FEXOFENADINE HCL 180 MG PO TABS: 180.0000 mg | ORAL_TABLET | Freq: Every day | ORAL | 3 refills | Status: DC

## 2019-06-21 MED ORDER — TRIAMCINOLONE ACETONIDE 55 MCG/ACT NA AERO: 2.0000 | INHALATION_SPRAY | Freq: Every day | NASAL | 12 refills | Status: DC

## 2019-06-21 NOTE — Telephone Encounter (Signed)
Pt came in to pick up letter. Shes stated she is still coughing up yellow mucous and wants to know if she can get a prescription to clear this up so she can go back to work. Please advise thanks.

## 2019-06-21 NOTE — Telephone Encounter (Signed)
Ok to add nasacort asd and Human resources officer for the allergies, thanks

## 2019-06-21 NOTE — Telephone Encounter (Signed)
Patient called to say that she is not able to access My Chart and would like to pick up a copy of the letter sent by Dr Jenny Reichmann on 06/20/2019 asking for a call so that she can pick it up around lunch time. Please call patient at Ph# [phone]

## 2019-06-21 NOTE — Addendum Note (Signed)
Addended by: Biagio Borg on: 06/21/2019 12:44 PM   Modules accepted: Orders

## 2019-06-21 NOTE — Telephone Encounter (Signed)
Letter is ready for pick up. Called patient and informed.

## 2019-06-21 NOTE — Telephone Encounter (Signed)
Patient need this letter to take with her to work on Monday 06/24/2019

## 2019-06-28 ENCOUNTER — Ambulatory Visit (INDEPENDENT_AMBULATORY_CARE_PROVIDER_SITE_OTHER): Payer: Medicare Other

## 2019-06-28 ENCOUNTER — Other Ambulatory Visit: Payer: Self-pay

## 2019-06-28 DIAGNOSIS — E538 Deficiency of other specified B group vitamins: Secondary | ICD-10-CM

## 2019-06-28 MED ORDER — CYANOCOBALAMIN 1000 MCG/ML IJ SOLN
1000.0000 ug | Freq: Once | INTRAMUSCULAR | Status: AC
  Administered 2019-06-28: 09:00:00 1000 ug via INTRAMUSCULAR

## 2019-06-28 NOTE — Progress Notes (Signed)
Medical screening examination/treatment/procedure(s) were performed by non-physician practitioner and as supervising physician I was immediately available for consultation/collaboration. I agree with above. Yuji Walth, MD   

## 2019-07-29 ENCOUNTER — Ambulatory Visit (INDEPENDENT_AMBULATORY_CARE_PROVIDER_SITE_OTHER): Payer: Medicare Other

## 2019-07-29 DIAGNOSIS — Z23 Encounter for immunization: Secondary | ICD-10-CM

## 2019-07-29 DIAGNOSIS — E538 Deficiency of other specified B group vitamins: Secondary | ICD-10-CM | POA: Diagnosis not present

## 2019-07-29 MED ORDER — CYANOCOBALAMIN 1000 MCG/ML IJ SOLN
1000.0000 ug | Freq: Once | INTRAMUSCULAR | Status: AC
  Administered 2019-07-29: 1000 ug via INTRAMUSCULAR

## 2019-07-29 NOTE — Progress Notes (Signed)
Medical screening examination/treatment/procedure(s) were performed by non-physician practitioner and as supervising physician I was immediately available for consultation/collaboration. I agree with above. [name] John, MD   

## 2019-08-19 ENCOUNTER — Telehealth: Payer: Self-pay | Admitting: Internal Medicine

## 2019-08-19 MED ORDER — ATENOLOL 100 MG PO TABS: 100.0000 mg | ORAL_TABLET | Freq: Every day | ORAL | 3 refills | Status: DC

## 2019-08-19 MED ORDER — ASPIRIN 81 MG PO TBEC: 81.0000 mg | DELAYED_RELEASE_TABLET | Freq: Every day | ORAL | 5 refills | Status: AC

## 2019-08-19 MED ORDER — LEVOTHYROXINE SODIUM 112 MCG PO TABS: 112.0000 ug | ORAL_TABLET | Freq: Every day | ORAL | 3 refills | Status: DC

## 2019-08-19 MED ORDER — GABAPENTIN 100 MG PO CAPS: 100.0000 mg | ORAL_CAPSULE | Freq: Three times a day (TID) | ORAL | 1 refills | Status: DC

## 2019-08-19 MED ORDER — PRAVASTATIN SODIUM 20 MG PO TABS: 20.0000 mg | ORAL_TABLET | Freq: Every day | ORAL | 3 refills | Status: DC

## 2019-08-19 NOTE — Telephone Encounter (Signed)
Medication Refill - Medication: pravastatin (PRAVACHOL) 20 MG tablet   levothyroxine (SYNTHROID, LEVOTHROID) 112 MCG tablet   atenolol (TENORMIN) 100 MG tablet   aspirin 81 MG EC tablet  gabapentin (NEURONTIN) 100 MG capsule  Has the patient contacted their pharmacy? Yes.   (Agent: If no, request that the patient contact the pharmacy for the refill.) (Agent: If yes, when and what did the pharmacy advise?)not able to get response form office   Preferred Pharmacy (with phone number or street name):  [facility] [phone] - [place], [place] - [address] [place] [phone] (Phone) [phone] (Fax)     Agent: Please be advised that RX refills may take up to 3 business days. We ask that you follow-up with your pharmacy.

## 2019-08-28 ENCOUNTER — Ambulatory Visit: Payer: Medicare Other

## 2019-08-30 ENCOUNTER — Other Ambulatory Visit: Payer: Self-pay

## 2019-08-30 ENCOUNTER — Ambulatory Visit (INDEPENDENT_AMBULATORY_CARE_PROVIDER_SITE_OTHER): Payer: Medicare Other

## 2019-08-30 DIAGNOSIS — E538 Deficiency of other specified B group vitamins: Secondary | ICD-10-CM

## 2019-08-30 MED ORDER — CYANOCOBALAMIN 1000 MCG/ML IJ SOLN
1000.0000 ug | Freq: Once | INTRAMUSCULAR | Status: AC
  Administered 2019-08-30: 1000 ug via INTRAMUSCULAR

## 2019-08-30 NOTE — Progress Notes (Signed)
Medical screening examination/treatment/procedure(s) were performed by non-physician practitioner and as supervising physician I was immediately available for consultation/collaboration. I agree with above. [name] John, MD   

## 2019-09-27 ENCOUNTER — Other Ambulatory Visit: Payer: Self-pay

## 2019-09-27 ENCOUNTER — Encounter: Payer: Self-pay | Admitting: Internal Medicine

## 2019-09-27 ENCOUNTER — Ambulatory Visit (INDEPENDENT_AMBULATORY_CARE_PROVIDER_SITE_OTHER): Payer: Medicare Other | Admitting: Internal Medicine

## 2019-09-27 VITALS — BP 124/82 | HR 72 | Temp 98.0°F | Ht 63.0 in | Wt 234.0 lb

## 2019-09-27 DIAGNOSIS — E538 Deficiency of other specified B group vitamins: Secondary | ICD-10-CM | POA: Diagnosis not present

## 2019-09-27 DIAGNOSIS — E611 Iron deficiency: Secondary | ICD-10-CM | POA: Diagnosis not present

## 2019-09-27 DIAGNOSIS — I10 Essential (primary) hypertension: Secondary | ICD-10-CM

## 2019-09-27 DIAGNOSIS — R7302 Impaired glucose tolerance (oral): Secondary | ICD-10-CM

## 2019-09-27 DIAGNOSIS — E039 Hypothyroidism, unspecified: Secondary | ICD-10-CM

## 2019-09-27 DIAGNOSIS — E559 Vitamin D deficiency, unspecified: Secondary | ICD-10-CM

## 2019-09-27 MED ORDER — CYANOCOBALAMIN 1000 MCG/ML IJ SOLN
1000.0000 ug | Freq: Once | INTRAMUSCULAR | Status: AC
  Administered 2019-09-27: 1000 ug via INTRAMUSCULAR

## 2019-09-27 NOTE — Assessment & Plan Note (Signed)
For b12 1000 mg IM today 
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
## 2019-09-27 NOTE — Assessment & Plan Note (Signed)
stable overall by history and exam, recent data reviewed with pt, and pt to continue medical treatment as before,  to f/u any worsening symptoms or concerns  

## 2019-09-27 NOTE — Patient Instructions (Signed)
You had the b12 shot today  Please continue all other medications as before, and refills have been done if requested.  Please have the pharmacy call with any other refills you may need.  Please continue your efforts at being more active, low cholesterol diet, and weight control.  Please keep your appointments with your specialists as you may have planned  Please go to the LAB in the Basement (turn left off the elevator) for the tests to be done today  You will be contacted by phone if any changes need to be made immediately.  Otherwise, you will receive a letter about your results with an explanation, but please check with MyChart first.  Please remember to sign up for MyChart if you have not done so, as this will be important to you in the future with finding out test results, communicating by private email, and scheduling acute appointments online when needed.  Please return in 6 months, or sooner if needed, with Lab testing done 3-5 days before
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
## 2019-11-27 ENCOUNTER — Encounter: Payer: Self-pay | Admitting: Obstetrics and Gynecology

## 2019-11-27 ENCOUNTER — Telehealth: Payer: Self-pay

## 2019-11-27 ENCOUNTER — Ambulatory Visit: Payer: Medicare Other | Admitting: Obstetrics and Gynecology

## 2019-11-27 DIAGNOSIS — L989 Disorder of the skin and subcutaneous tissue, unspecified: Secondary | ICD-10-CM

## 2019-11-27 NOTE — Telephone Encounter (Signed)
Copied from CRM [id number]. Topic: Referral - Request for Referral >> Nov 27, 2019  1:56 PM [name] wrote: Has patient seen PCP for this complaint?  no *If NO, is insurance requiring patient see PCP for this issue before PCP can refer them? Referral for which specialty:  Dermatologist  Preferred provider/office: none  Reason for referral:She was told by the [facility] she should see a dermatologist due to having dermatitis or eczema in the ears ,   ,please call her at [phone]

## 2019-11-28 NOTE — Telephone Encounter (Signed)
Ok this referral is done 

## 2019-12-03 NOTE — Telephone Encounter (Signed)
Spoke to pt and she is aware referral is being worked on

## 2019-12-03 NOTE — Telephone Encounter (Addendum)
Pt called back in to follow up on referral to dermatology that was placed. Pt says that she has not received a call. Pt would like to discuss further.    CB: [phone]

## 2019-12-09 ENCOUNTER — Other Ambulatory Visit: Payer: Medicare Other

## 2019-12-10 ENCOUNTER — Encounter: Payer: Self-pay | Admitting: Internal Medicine

## 2019-12-10 DIAGNOSIS — H40013 Open angle with borderline findings, low risk, bilateral: Secondary | ICD-10-CM | POA: Diagnosis not present

## 2019-12-10 DIAGNOSIS — H353111 Nonexudative age-related macular degeneration, right eye, early dry stage: Secondary | ICD-10-CM | POA: Diagnosis not present

## 2019-12-10 DIAGNOSIS — H35362 Drusen (degenerative) of macula, left eye: Secondary | ICD-10-CM | POA: Diagnosis not present

## 2019-12-10 DIAGNOSIS — H524 Presbyopia: Secondary | ICD-10-CM | POA: Diagnosis not present

## 2019-12-10 DIAGNOSIS — E119 Type 2 diabetes mellitus without complications: Secondary | ICD-10-CM | POA: Diagnosis not present

## 2019-12-10 LAB — HM DIABETES EYE EXAM

## 2019-12-11 ENCOUNTER — Ambulatory Visit: Payer: Self-pay | Attending: Internal Medicine

## 2019-12-11 DIAGNOSIS — L218 Other seborrheic dermatitis: Secondary | ICD-10-CM | POA: Diagnosis not present

## 2019-12-23 ENCOUNTER — Ambulatory Visit: Payer: Medicare Other

## 2019-12-28 ENCOUNTER — Encounter: Payer: Self-pay | Admitting: Internal Medicine

## 2019-12-30 NOTE — Telephone Encounter (Signed)
Spoke with granddaughter, Appointment has been made for 2/19.

## 2019-12-30 NOTE — Telephone Encounter (Signed)
Needs OV, as this sounds like a cervical spine neuritic pain and a chest xray may not needed for these symptoms

## 2020-01-03 ENCOUNTER — Ambulatory Visit: Payer: Medicare Other | Admitting: Internal Medicine

## 2020-01-03 ENCOUNTER — Ambulatory Visit: Payer: Medicare Other

## 2020-01-09 ENCOUNTER — Ambulatory Visit: Payer: Medicare Other | Admitting: Internal Medicine

## 2020-01-22 ENCOUNTER — Ambulatory Visit (INDEPENDENT_AMBULATORY_CARE_PROVIDER_SITE_OTHER): Payer: Medicare Other | Admitting: Internal Medicine

## 2020-01-22 ENCOUNTER — Other Ambulatory Visit: Payer: Self-pay

## 2020-01-22 ENCOUNTER — Encounter: Payer: Self-pay | Admitting: Internal Medicine

## 2020-01-22 VITALS — BP 140/78 | HR 56 | Temp 98.2°F | Ht 63.0 in | Wt 231.0 lb

## 2020-01-22 DIAGNOSIS — G5603 Carpal tunnel syndrome, bilateral upper limbs: Secondary | ICD-10-CM | POA: Diagnosis not present

## 2020-01-22 DIAGNOSIS — Z0001 Encounter for general adult medical examination with abnormal findings: Secondary | ICD-10-CM

## 2020-01-22 DIAGNOSIS — E559 Vitamin D deficiency, unspecified: Secondary | ICD-10-CM | POA: Diagnosis not present

## 2020-01-22 DIAGNOSIS — Z0000 Encounter for general adult medical examination without abnormal findings: Secondary | ICD-10-CM

## 2020-01-22 DIAGNOSIS — E611 Iron deficiency: Secondary | ICD-10-CM | POA: Diagnosis not present

## 2020-01-22 DIAGNOSIS — E538 Deficiency of other specified B group vitamins: Secondary | ICD-10-CM | POA: Diagnosis not present

## 2020-01-22 DIAGNOSIS — R7302 Impaired glucose tolerance (oral): Secondary | ICD-10-CM | POA: Diagnosis not present

## 2020-01-22 DIAGNOSIS — M25512 Pain in left shoulder: Secondary | ICD-10-CM | POA: Diagnosis not present

## 2020-01-22 DIAGNOSIS — M5412 Radiculopathy, cervical region: Secondary | ICD-10-CM

## 2020-01-22 DIAGNOSIS — N183 Chronic kidney disease, stage 3 unspecified: Secondary | ICD-10-CM

## 2020-01-22 DIAGNOSIS — I10 Essential (primary) hypertension: Secondary | ICD-10-CM

## 2020-01-22 LAB — URINALYSIS, ROUTINE W REFLEX MICROSCOPIC
Bilirubin Urine: NEGATIVE
Hgb urine dipstick: NEGATIVE
Ketones, ur: NEGATIVE
Nitrite: NEGATIVE
Specific Gravity, Urine: 1.02 (ref 1.000–1.030)
Total Protein, Urine: NEGATIVE
Urine Glucose: NEGATIVE
Urobilinogen, UA: 0.2 (ref 0.0–1.0)
pH: 6 (ref 5.0–8.0)

## 2020-01-22 LAB — IBC PANEL
Iron: 76 ug/dL (ref 42–145)
Saturation Ratios: 26.7 % (ref 20.0–50.0)
Transferrin: 203 mg/dL — ABNORMAL LOW (ref 212.0–360.0)

## 2020-01-22 LAB — TSH: TSH: 2.49 u[IU]/mL (ref 0.35–4.50)

## 2020-01-22 LAB — CBC WITH DIFFERENTIAL/PLATELET
Basophils Absolute: 0 10*3/uL (ref 0.0–0.1)
Basophils Relative: 0.4 % (ref 0.0–3.0)
Eosinophils Absolute: 0.1 10*3/uL (ref 0.0–0.7)
Eosinophils Relative: 1.5 % (ref 0.0–5.0)
HCT: 33.8 % — ABNORMAL LOW (ref 36.0–46.0)
Hemoglobin: 11.3 g/dL — ABNORMAL LOW (ref 12.0–15.0)
Lymphocytes Relative: 26.9 % (ref 12.0–46.0)
Lymphs Abs: 1.4 10*3/uL (ref 0.7–4.0)
MCHC: 33.4 g/dL (ref 30.0–36.0)
MCV: 90.2 fl (ref 78.0–100.0)
Monocytes Absolute: 0.5 10*3/uL (ref 0.1–1.0)
Monocytes Relative: 10.2 % (ref 3.0–12.0)
Neutro Abs: 3.1 10*3/uL (ref 1.4–7.7)
Neutrophils Relative %: 61 % (ref 43.0–77.0)
Platelets: 139 10*3/uL — ABNORMAL LOW (ref 150.0–400.0)
RBC: 3.75 Mil/uL — ABNORMAL LOW (ref 3.87–5.11)
RDW: 13.2 % (ref 11.5–15.5)
WBC: 5.2 10*3/uL (ref 4.0–10.5)

## 2020-01-22 LAB — HEPATIC FUNCTION PANEL
ALT: 12 U/L (ref 0–35)
AST: 20 U/L (ref 0–37)
Albumin: 3.9 g/dL (ref 3.5–5.2)
Alkaline Phosphatase: 59 U/L (ref 39–117)
Bilirubin, Direct: 0.1 mg/dL (ref 0.0–0.3)
Total Bilirubin: 0.4 mg/dL (ref 0.2–1.2)
Total Protein: 7.6 g/dL (ref 6.0–8.3)

## 2020-01-22 LAB — LIPID PANEL
Cholesterol: 137 mg/dL (ref 0–200)
HDL: 41.5 mg/dL (ref >39.00)
LDL Cholesterol: 73 mg/dL (ref 0–99)
NonHDL: 95.98
Total CHOL/HDL Ratio: 3
Triglycerides: 117 mg/dL (ref 0.0–149.0)
VLDL: 23.4 mg/dL (ref 0.0–40.0)

## 2020-01-22 LAB — BASIC METABOLIC PANEL
BUN: 20 mg/dL (ref 6–23)
CO2: 31 mEq/L (ref 19–32)
Calcium: 10.1 mg/dL (ref 8.4–10.5)
Chloride: 105 mEq/L (ref 96–112)
Creatinine, Ser: 1.58 mg/dL — ABNORMAL HIGH (ref 0.40–1.20)
GFR: 38.24 mL/min — ABNORMAL LOW (ref >60.00)
Glucose, Bld: 93 mg/dL (ref 70–99)
Potassium: 4.6 mEq/L (ref 3.5–5.1)
Sodium: 139 mEq/L (ref 135–145)

## 2020-01-22 LAB — HEMOGLOBIN A1C: Hgb A1c MFr Bld: 5.5 % (ref 4.6–6.5)

## 2020-01-22 MED ORDER — CYANOCOBALAMIN 1000 MCG/ML IJ SOLN
1000.0000 ug | Freq: Once | INTRAMUSCULAR | Status: AC
  Administered 2020-01-22: 1000 ug via INTRAMUSCULAR
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
## 2020-01-22 NOTE — Patient Instructions (Signed)
Please continue all other medications as before, and refills have been done if requested.  Please have the pharmacy call with any other refills you may need.  Please continue your efforts at being more active, low cholesterol diet, and weight control.  You are otherwise up to date with prevention measures today.  Please keep your appointments with your specialists as you may have planned  You will be contacted regarding the referral for: Dr Katrinka Blazing for the left shoulder pain, and Neurosurgury for the carpal tunnel and neck nerve pain  You will be contacted regarding the referral for: MRI for the neck  Please go to the LAB at the blood drawing area for the tests to be done  You will be contacted by phone if any changes need to be made immediately.  Otherwise, you will receive a letter about your results with an explanation, but please check with MyChart first.  Please remember to sign up for MyChart if you have not done so, as this will be important to you in the future with finding out test results, communicating by private email, and scheduling acute appointments online when needed.  Please make an Appointment to return in 6 months, or sooner if needed

## 2020-01-23 LAB — VITAMIN D 25 HYDROXY (VIT D DEFICIENCY, FRACTURES): VITD: 51.75 ng/mL (ref 30.00–100.00)

## 2020-01-23 LAB — VITAMIN B12: Vitamin B-12: 607 pg/mL (ref 211–911)

## 2020-01-27 ENCOUNTER — Encounter: Payer: Self-pay | Admitting: Internal Medicine

## 2020-01-27 DIAGNOSIS — E611 Iron deficiency: Secondary | ICD-10-CM | POA: Insufficient documentation

## 2020-01-27 NOTE — Assessment & Plan Note (Addendum)
Also for NS referral after MRI c spine,  to f/u any worsening symptoms or concerns  I spent 42 minutes in preparing to see the patient by review of recent labs, imaging and procedures, obtaining and reviewing separately obtained history, communicating with the patient and family or caregiver, ordering medications, tests or procedures, and documenting clinical information in the EHR including the differential Dx, treatment, and any further evaluation and other management of left cervical radiculopathy, bilateral CTS, left shoulder pain, bilateral feet paresthesias, hyperblycemia, HTN, CKD, vit d and b12 deficiency

## 2020-01-27 NOTE — Assessment & Plan Note (Signed)
stable overall by history and exam, recent data reviewed with pt, and pt to continue medical treatment as before,  to f/u any worsening symptoms or concerns  

## 2020-01-27 NOTE — Assessment & Plan Note (Signed)
For lab f/u, consider further oral replacement

## 2020-01-27 NOTE — Assessment & Plan Note (Signed)

## 2020-01-27 NOTE — Assessment & Plan Note (Signed)
For replacement 

## 2020-01-27 NOTE — Assessment & Plan Note (Signed)
For NS referral

## 2020-01-27 NOTE — Assessment & Plan Note (Signed)
For lab f/u and replacement

## 2020-01-27 NOTE — Assessment & Plan Note (Signed)
Specific to shoulder not related to her known bilat CTS and now likely left cervical radiculopathy as well -for pain control, refer sports medicine

## 2020-02-04 ENCOUNTER — Encounter: Payer: Self-pay | Admitting: Certified Nurse Midwife

## 2020-02-21 ENCOUNTER — Other Ambulatory Visit: Payer: Medicare Other

## 2020-02-24 ENCOUNTER — Ambulatory Visit: Payer: Medicare Other

## 2020-02-28 ENCOUNTER — Other Ambulatory Visit: Payer: Self-pay

## 2020-02-28 ENCOUNTER — Ambulatory Visit (INDEPENDENT_AMBULATORY_CARE_PROVIDER_SITE_OTHER): Payer: Medicare Other | Admitting: *Deleted

## 2020-02-28 DIAGNOSIS — E538 Deficiency of other specified B group vitamins: Secondary | ICD-10-CM | POA: Diagnosis not present

## 2020-02-28 MED ORDER — CYANOCOBALAMIN 1000 MCG/ML IJ SOLN
1000.0000 ug | Freq: Once | INTRAMUSCULAR | Status: AC
  Administered 2020-02-28: 08:00:00 1000 ug via INTRAMUSCULAR

## 2020-02-28 NOTE — Progress Notes (Signed)
Medical treatment/procedure(s) were performed by non-physician practitioner and as supervising physician I was immediately available for consultation/collaboration. I agree with above. Jadin Kagel A Jkwon Treptow, MD  

## 2020-02-28 NOTE — Progress Notes (Signed)
Pls cosign for B12 inj since PCP is out of the office../lmb 

## 2020-03-21 ENCOUNTER — Ambulatory Visit
Admission: RE | Admit: 2020-03-21 | Discharge: 2020-03-21 | Disposition: A | Discharge status: Home / Self Care | Payer: Medicare Other | Source: Ambulatory Visit | Attending: Internal Medicine | Admitting: Internal Medicine

## 2020-03-21 DIAGNOSIS — M5412 Radiculopathy, cervical region: Secondary | ICD-10-CM

## 2020-03-30 ENCOUNTER — Other Ambulatory Visit: Payer: Self-pay

## 2020-03-30 ENCOUNTER — Ambulatory Visit (INDEPENDENT_AMBULATORY_CARE_PROVIDER_SITE_OTHER): Payer: Medicare Other | Admitting: *Deleted

## 2020-03-30 DIAGNOSIS — E538 Deficiency of other specified B group vitamins: Secondary | ICD-10-CM | POA: Diagnosis not present

## 2020-03-30 MED ORDER — CYANOCOBALAMIN 1000 MCG/ML IJ SOLN
1000.0000 ug | Freq: Once | INTRAMUSCULAR | Status: AC
  Administered 2020-03-30: 1000 ug via INTRAMUSCULAR

## 2020-03-30 NOTE — Progress Notes (Signed)
Pls cosign for B12 inj../lmb  

## 2020-03-31 ENCOUNTER — Ambulatory Visit: Payer: Medicare Other | Admitting: Internal Medicine

## 2020-05-02 ENCOUNTER — Other Ambulatory Visit: Payer: Self-pay

## 2020-05-02 ENCOUNTER — Ambulatory Visit
Admission: RE | Admit: 2020-05-02 | Discharge: 2020-05-02 | Disposition: A | Discharge status: Home / Self Care | Payer: Medicare Other | Source: Ambulatory Visit | Attending: Internal Medicine | Admitting: Internal Medicine

## 2020-05-02 DIAGNOSIS — M4802 Spinal stenosis, cervical region: Secondary | ICD-10-CM | POA: Diagnosis not present

## 2020-05-04 ENCOUNTER — Other Ambulatory Visit: Payer: Self-pay

## 2020-05-04 ENCOUNTER — Ambulatory Visit (INDEPENDENT_AMBULATORY_CARE_PROVIDER_SITE_OTHER): Payer: Medicare Other

## 2020-05-04 DIAGNOSIS — E538 Deficiency of other specified B group vitamins: Secondary | ICD-10-CM

## 2020-05-04 MED ORDER — CYANOCOBALAMIN 1000 MCG/ML IJ SOLN
1000.0000 ug | Freq: Once | INTRAMUSCULAR | Status: AC
  Administered 2020-05-04: 1000 ug via INTRAMUSCULAR

## 2020-05-04 NOTE — Progress Notes (Signed)
Pt here for scheduled cyanobalamin given in right deltoid. Pt tolerated well.  Pt aware to make monthly appt before leaving today.

## 2020-06-03 ENCOUNTER — Other Ambulatory Visit: Payer: Self-pay

## 2020-06-03 ENCOUNTER — Ambulatory Visit (INDEPENDENT_AMBULATORY_CARE_PROVIDER_SITE_OTHER): Payer: Medicare Other | Admitting: *Deleted

## 2020-06-03 ENCOUNTER — Telehealth: Payer: Self-pay | Admitting: Internal Medicine

## 2020-06-03 DIAGNOSIS — E538 Deficiency of other specified B group vitamins: Secondary | ICD-10-CM | POA: Diagnosis not present

## 2020-06-03 MED ORDER — CYANOCOBALAMIN 1000 MCG/ML IJ SOLN
1000.0000 ug | Freq: Once | INTRAMUSCULAR | Status: AC
  Administered 2020-06-03: 1000 ug via INTRAMUSCULAR

## 2020-06-03 NOTE — Telephone Encounter (Signed)
Patient has dropped off a renewal parking placard.  Form has been completed & Placed in providers box to sign.

## 2020-06-03 NOTE — Telephone Encounter (Signed)
Form has been signed, Copy sent to scan.   LVM to inform patient original mailed to her as request.

## 2020-06-03 NOTE — Progress Notes (Signed)
Pls cosign for B12 inj../lmb  

## 2020-06-15 ENCOUNTER — Encounter: Payer: Self-pay | Admitting: Family Medicine

## 2020-06-15 ENCOUNTER — Other Ambulatory Visit: Payer: Self-pay

## 2020-06-15 ENCOUNTER — Ambulatory Visit: Payer: Medicare Other | Admitting: Family Medicine

## 2020-06-15 ENCOUNTER — Telehealth: Payer: Self-pay | Admitting: Family Medicine

## 2020-06-15 VITALS — BP 130/80 | HR 95 | Ht 63.0 in | Wt 235.0 lb

## 2020-06-15 DIAGNOSIS — M47816 Spondylosis without myelopathy or radiculopathy, lumbar region: Secondary | ICD-10-CM | POA: Diagnosis not present

## 2020-06-15 DIAGNOSIS — M542 Cervicalgia: Secondary | ICD-10-CM

## 2020-06-15 DIAGNOSIS — M5412 Radiculopathy, cervical region: Secondary | ICD-10-CM

## 2020-06-15 DIAGNOSIS — R634 Abnormal weight loss: Secondary | ICD-10-CM | POA: Diagnosis not present

## 2020-06-15 DIAGNOSIS — R635 Abnormal weight gain: Secondary | ICD-10-CM | POA: Diagnosis not present

## 2020-06-15 MED ORDER — GABAPENTIN 100 MG PO CAPS: 200.0000 mg | ORAL_CAPSULE | Freq: Every day | ORAL | 0 refills | Status: DC

## 2020-06-15 NOTE — Assessment & Plan Note (Signed)
Severe on MRI with likely nerve impingement noted.  C7-T1 epidural ordered today.  Patient does have bilateral carpal tunnel and will need to consider as well.  Follow-up again in 3 to 4 weeks after the injection

## 2020-06-15 NOTE — Telephone Encounter (Signed)
Patient notified that script was called in.
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
## 2020-06-15 NOTE — Patient Instructions (Addendum)
Good to see you Referral to [facility] for epidural [phone] See me again in 3-4 weeks after epidural

## 2020-06-15 NOTE — Assessment & Plan Note (Signed)
Continue radiculopathy.  MRI does show a potential for nerve impingement.  Encouraged potential C7-T1 epidural that I think will be beneficial.  Follow-up again in 4 to 8 weeks

## 2020-06-15 NOTE — Assessment & Plan Note (Signed)
Patient is morbidly obese.  Discussed icing regimen at home exercise activities to do which wants to avoid.  Patient will be sent to [facility] most of her other chronic

## 2020-06-15 NOTE — Telephone Encounter (Signed)
Patient stopped on her way out today after her appointment and asked if Dr Katrinka Blazing would be able to refill her Gabapentin prescription?  Please advise.

## 2020-06-15 NOTE — Assessment & Plan Note (Signed)
Worsening symptoms again at this time.  We will refer patient again for facet injections that I think will be beneficial.  Patient is on multiple different medications would like to not do this on a regular basis.  Discussed home exercises and icing regimen.  Increase activity slowly.  Follow-up with me again in 4 weeks after the epidural.

## 2020-06-23 ENCOUNTER — Other Ambulatory Visit: Payer: Medicare Other

## 2020-07-03 ENCOUNTER — Other Ambulatory Visit: Payer: Medicare Other

## 2020-07-24 ENCOUNTER — Ambulatory Visit: Payer: Medicare Other | Admitting: Internal Medicine

## 2020-08-04 ENCOUNTER — Ambulatory Visit: Payer: Medicare Other | Admitting: Internal Medicine

## 2020-08-10 DIAGNOSIS — N183 Chronic kidney disease, stage 3 unspecified: Secondary | ICD-10-CM | POA: Diagnosis not present

## 2020-08-13 DIAGNOSIS — N183 Chronic kidney disease, stage 3 unspecified: Secondary | ICD-10-CM | POA: Diagnosis not present

## 2020-08-13 DIAGNOSIS — E1122 Type 2 diabetes mellitus with diabetic chronic kidney disease: Secondary | ICD-10-CM | POA: Diagnosis not present

## 2020-08-13 DIAGNOSIS — I129 Hypertensive chronic kidney disease with stage 1 through stage 4 chronic kidney disease, or unspecified chronic kidney disease: Secondary | ICD-10-CM | POA: Diagnosis not present

## 2020-08-19 ENCOUNTER — Ambulatory Visit (INDEPENDENT_AMBULATORY_CARE_PROVIDER_SITE_OTHER): Payer: Medicare Other | Admitting: Internal Medicine

## 2020-08-19 ENCOUNTER — Encounter: Payer: Self-pay | Admitting: Internal Medicine

## 2020-08-19 ENCOUNTER — Other Ambulatory Visit: Payer: Self-pay

## 2020-08-19 VITALS — BP 130/90 | HR 68 | Temp 98.6°F | Ht 63.0 in | Wt 233.0 lb

## 2020-08-19 DIAGNOSIS — Z23 Encounter for immunization: Secondary | ICD-10-CM

## 2020-08-19 DIAGNOSIS — E559 Vitamin D deficiency, unspecified: Secondary | ICD-10-CM

## 2020-08-19 DIAGNOSIS — E039 Hypothyroidism, unspecified: Secondary | ICD-10-CM | POA: Diagnosis not present

## 2020-08-19 DIAGNOSIS — E538 Deficiency of other specified B group vitamins: Secondary | ICD-10-CM

## 2020-08-19 DIAGNOSIS — E785 Hyperlipidemia, unspecified: Secondary | ICD-10-CM | POA: Diagnosis not present

## 2020-08-19 DIAGNOSIS — R7302 Impaired glucose tolerance (oral): Secondary | ICD-10-CM

## 2020-08-19 LAB — POCT GLYCOSYLATED HEMOGLOBIN (HGB A1C): Hemoglobin A1C: 5.4 % (ref 4.0–5.6)

## 2020-08-19 MED ORDER — CYANOCOBALAMIN 1000 MCG/ML IJ SOLN
1000.0000 ug | Freq: Once | INTRAMUSCULAR | Status: AC
  Administered 2020-08-19: 1000 ug via INTRAMUSCULAR

## 2020-08-19 NOTE — Patient Instructions (Addendum)
You had the flu shot today, and the B12 shot today  Your A1c for sugar was [facility] today  Please continue all other medications as before, and refills have been done if requested.  Please have the pharmacy call with any other refills you may need.  Please continue your efforts at being more active, low cholesterol diet, and weight control.  Please keep your appointments with your specialists as you may have planned  Please make an Appointment to return in 6 months, or sooner if needed, also with Lab Appointment for testing done 3-5 days before at the FIRST FLOOR Lab (so this is for TWO appointments - please see the scheduling desk as you leave)

## 2020-08-19 NOTE — Assessment & Plan Note (Addendum)
For b12 1000 mcgIM today , but can continue oral daily from now on  I spent 31 minutes in preparing to see the patient by review of recent labs, imaging and procedures, obtaining and reviewing separately obtained history, communicating with the patient and family or caregiver, ordering medications, tests or procedures, and documenting clinical information in the EHR including the differential Dx, treatment, and any further evaluation and other management of b12 and d deficiency, hld, hypothyroidism, hypergycemia
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
## 2020-08-23 ENCOUNTER — Encounter: Payer: Self-pay | Admitting: Internal Medicine

## 2020-08-23 NOTE — Assessment & Plan Note (Signed)
For contd oral replacement 

## 2020-08-23 NOTE — Assessment & Plan Note (Signed)
stable overall by history and exam, recent data reviewed with pt, and pt to continue medical treatment as before,  to f/u any worsening symptoms or concerns  

## 2020-08-25 ENCOUNTER — Other Ambulatory Visit: Payer: Self-pay | Admitting: Internal Medicine

## 2020-08-25 NOTE — Telephone Encounter (Signed)
Please refill as per office routine med refill policy (all routine meds refilled for 3 mo or monthly per pt preference up to one year from last visit, then month to month grace period for 3 mo, then further med refills will have to be denied)  

## 2020-09-21 ENCOUNTER — Ambulatory Visit: Payer: Medicare Other

## 2020-10-01 ENCOUNTER — Other Ambulatory Visit: Payer: Self-pay

## 2020-10-01 ENCOUNTER — Ambulatory Visit (INDEPENDENT_AMBULATORY_CARE_PROVIDER_SITE_OTHER): Payer: Medicare Other

## 2020-10-01 DIAGNOSIS — E538 Deficiency of other specified B group vitamins: Secondary | ICD-10-CM

## 2020-10-01 MED ORDER — CYANOCOBALAMIN 1000 MCG/ML IJ SOLN
1000.0000 ug | Freq: Once | INTRAMUSCULAR | Status: AC
  Administered 2020-10-01: 1000 ug via INTRAMUSCULAR

## 2020-10-01 NOTE — Progress Notes (Signed)
B12 given and tolerated well °

## 2020-11-02 ENCOUNTER — Other Ambulatory Visit: Payer: Self-pay | Admitting: Family

## 2020-11-02 ENCOUNTER — Other Ambulatory Visit: Payer: Self-pay

## 2020-11-02 ENCOUNTER — Ambulatory Visit: Payer: Medicare Other

## 2020-11-02 ENCOUNTER — Ambulatory Visit (INDEPENDENT_AMBULATORY_CARE_PROVIDER_SITE_OTHER): Payer: Medicare Other | Admitting: Family

## 2020-11-02 ENCOUNTER — Encounter: Payer: Self-pay | Admitting: Family

## 2020-11-02 VITALS — BP 144/72 | HR 63 | Temp 98.1°F | Ht 63.0 in | Wt 234.0 lb

## 2020-11-02 DIAGNOSIS — J45909 Unspecified asthma, uncomplicated: Secondary | ICD-10-CM

## 2020-11-02 MED ORDER — AZITHROMYCIN 250 MG PO TABS: ORAL_TABLET | ORAL | 0 refills | Status: DC

## 2020-11-02 MED ORDER — PREDNISONE 20 MG PO TABS: 20.0000 mg | ORAL_TABLET | Freq: Every day | ORAL | 0 refills | Status: DC
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
## 2020-12-10 DIAGNOSIS — E119 Type 2 diabetes mellitus without complications: Secondary | ICD-10-CM | POA: Diagnosis not present

## 2020-12-10 DIAGNOSIS — H40013 Open angle with borderline findings, low risk, bilateral: Secondary | ICD-10-CM | POA: Diagnosis not present

## 2020-12-10 DIAGNOSIS — H353131 Nonexudative age-related macular degeneration, bilateral, early dry stage: Secondary | ICD-10-CM | POA: Diagnosis not present

## 2020-12-10 DIAGNOSIS — H04123 Dry eye syndrome of bilateral lacrimal glands: Secondary | ICD-10-CM | POA: Diagnosis not present

## 2020-12-10 LAB — HM DIABETES EYE EXAM

## 2020-12-17 ENCOUNTER — Encounter: Payer: Self-pay | Admitting: Internal Medicine

## 2021-02-18 ENCOUNTER — Encounter: Payer: Self-pay | Admitting: Internal Medicine

## 2021-02-18 ENCOUNTER — Other Ambulatory Visit: Payer: Self-pay | Admitting: Internal Medicine

## 2021-02-18 ENCOUNTER — Other Ambulatory Visit: Payer: Self-pay

## 2021-02-18 ENCOUNTER — Ambulatory Visit (INDEPENDENT_AMBULATORY_CARE_PROVIDER_SITE_OTHER): Payer: Medicare Other | Admitting: Internal Medicine

## 2021-02-18 VITALS — BP 124/78 | HR 58 | Temp 98.5°F | Ht 63.0 in | Wt 233.0 lb

## 2021-02-18 DIAGNOSIS — R7302 Impaired glucose tolerance (oral): Secondary | ICD-10-CM

## 2021-02-18 DIAGNOSIS — I5032 Chronic diastolic (congestive) heart failure: Secondary | ICD-10-CM

## 2021-02-18 DIAGNOSIS — E538 Deficiency of other specified B group vitamins: Secondary | ICD-10-CM

## 2021-02-18 DIAGNOSIS — I10 Essential (primary) hypertension: Secondary | ICD-10-CM

## 2021-02-18 DIAGNOSIS — Z1159 Encounter for screening for other viral diseases: Secondary | ICD-10-CM

## 2021-02-18 DIAGNOSIS — D509 Iron deficiency anemia, unspecified: Secondary | ICD-10-CM

## 2021-02-18 DIAGNOSIS — E7800 Pure hypercholesterolemia, unspecified: Secondary | ICD-10-CM | POA: Diagnosis not present

## 2021-02-18 DIAGNOSIS — E559 Vitamin D deficiency, unspecified: Secondary | ICD-10-CM | POA: Diagnosis not present

## 2021-02-18 DIAGNOSIS — Z0001 Encounter for general adult medical examination with abnormal findings: Secondary | ICD-10-CM | POA: Diagnosis not present

## 2021-02-18 LAB — CBC WITH DIFFERENTIAL/PLATELET
Basophils Absolute: 0 10*3/uL (ref 0.0–0.1)
Basophils Relative: 0.3 % (ref 0.0–3.0)
Eosinophils Absolute: 0.1 10*3/uL (ref 0.0–0.7)
Eosinophils Relative: 1.4 % (ref 0.0–5.0)
HCT: 32.9 % — ABNORMAL LOW (ref 36.0–46.0)
Hemoglobin: 11.1 g/dL — ABNORMAL LOW (ref 12.0–15.0)
Lymphocytes Relative: 31.3 % (ref 12.0–46.0)
Lymphs Abs: 1.4 10*3/uL (ref 0.7–4.0)
MCHC: 33.8 g/dL (ref 30.0–36.0)
MCV: 89.7 fl (ref 78.0–100.0)
Monocytes Absolute: 0.5 10*3/uL (ref 0.1–1.0)
Monocytes Relative: 10.5 % (ref 3.0–12.0)
Neutro Abs: 2.5 10*3/uL (ref 1.4–7.7)
Neutrophils Relative %: 56.5 % (ref 43.0–77.0)
Platelets: 150 10*3/uL (ref 150.0–400.0)
RBC: 3.67 Mil/uL — ABNORMAL LOW (ref 3.87–5.11)
RDW: 13.5 % (ref 11.5–15.5)
WBC: 4.3 10*3/uL (ref 4.0–10.5)

## 2021-02-18 LAB — BASIC METABOLIC PANEL
BUN: 19 mg/dL (ref 6–23)
CO2: 29 mEq/L (ref 19–32)
Calcium: 9.5 mg/dL (ref 8.4–10.5)
Chloride: 108 mEq/L (ref 96–112)
Creatinine, Ser: 1.84 mg/dL — ABNORMAL HIGH (ref 0.40–1.20)
GFR: 25.79 mL/min — ABNORMAL LOW (ref >60.00)
Glucose, Bld: 89 mg/dL (ref 70–99)
Potassium: 4.6 mEq/L (ref 3.5–5.1)
Sodium: 142 mEq/L (ref 135–145)

## 2021-02-18 LAB — URINALYSIS, ROUTINE W REFLEX MICROSCOPIC
Bilirubin Urine: NEGATIVE
Ketones, ur: NEGATIVE
Nitrite: NEGATIVE
RBC / HPF: NONE SEEN (ref 0–?)
Specific Gravity, Urine: 1.01 (ref 1.000–1.030)
Total Protein, Urine: NEGATIVE
Urine Glucose: NEGATIVE
Urobilinogen, UA: 0.2 (ref 0.0–1.0)
pH: 6 (ref 5.0–8.0)

## 2021-02-18 LAB — MICROALBUMIN / CREATININE URINE RATIO
Creatinine,U: 121.9 mg/dL
Microalb Creat Ratio: 1.3 mg/g (ref 0.0–30.0)
Microalb, Ur: 1.6 mg/dL (ref 0.0–1.9)

## 2021-02-18 LAB — HEPATIC FUNCTION PANEL
ALT: 14 U/L (ref 0–35)
AST: 20 U/L (ref 0–37)
Albumin: 4 g/dL (ref 3.5–5.2)
Alkaline Phosphatase: 57 U/L (ref 39–117)
Bilirubin, Direct: 0.1 mg/dL (ref 0.0–0.3)
Total Bilirubin: 0.4 mg/dL (ref 0.2–1.2)
Total Protein: 7.1 g/dL (ref 6.0–8.3)

## 2021-02-18 LAB — LIPID PANEL
Cholesterol: 140 mg/dL (ref 0–200)
HDL: 43.2 mg/dL (ref >39.00)
LDL Cholesterol: 77 mg/dL (ref 0–99)
NonHDL: 96.79
Total CHOL/HDL Ratio: 3
Triglycerides: 97 mg/dL (ref 0.0–149.0)
VLDL: 19.4 mg/dL (ref 0.0–40.0)

## 2021-02-18 LAB — VITAMIN B12: Vitamin B-12: 635 pg/mL (ref 211–911)

## 2021-02-18 LAB — VITAMIN D 25 HYDROXY (VIT D DEFICIENCY, FRACTURES): VITD: 49.65 ng/mL (ref 30.00–100.00)

## 2021-02-18 LAB — TSH: TSH: 3.1 u[IU]/mL (ref 0.35–4.50)

## 2021-02-18 LAB — HEMOGLOBIN A1C: Hgb A1c MFr Bld: 5.9 % (ref 4.6–6.5)

## 2021-02-18 MED ORDER — CIPROFLOXACIN HCL 500 MG PO TABS: 500.0000 mg | ORAL_TABLET | Freq: Two times a day (BID) | ORAL | 0 refills | Status: AC

## 2021-02-18 NOTE — Patient Instructions (Signed)

## 2021-02-18 NOTE — Assessment & Plan Note (Signed)
Lab Results  Component Value Date   HGBA1C 5.9 02/18/2021   Stable, pt to continue current medical treatment  - diet  

## 2021-02-18 NOTE — Assessment & Plan Note (Signed)
BP Readings from Last 3 Encounters:  02/18/21 124/78  11/02/20 (!) 144/72  08/19/20 130/90   Stable, pt to continue medical treatment tenormin

## 2021-02-18 NOTE — Assessment & Plan Note (Signed)
Lab Results  Component Value Date   VITAMINB12 635 02/18/2021   Stable, cont oral replacement - b12 1000 mcg qd

## 2021-02-18 NOTE — Assessment & Plan Note (Signed)
.   Last vitamin D Lab Results  Component Value Date   VD25OH 49.65 02/18/2021   Stable, cont oral replacement

## 2021-02-18 NOTE — Assessment & Plan Note (Signed)
Lab Results  Component Value Date   IRON 76 01/22/2020   Stable, cont current med tx
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
## 2021-02-18 NOTE — Assessment & Plan Note (Signed)
Stable, cont current lasix

## 2021-02-18 NOTE — Assessment & Plan Note (Addendum)
Lab Results  Component Value Date   LDLCALC 77 02/18/2021   Mild uncontrolled, goal ldl < 70, pt to continue current chol diet, declines statin

## 2021-02-18 NOTE — Assessment & Plan Note (Signed)
Age and sex appropriate education and counseling updated with regular exercise and diet Referrals for preventative services - for hep c screen Immunizations addressed - none needed Smoking counseling  - none needed Evidence for depression or other mood disorder - none significant Most recent labs reviewed. I have personally reviewed and have noted: 1) the patient's medical and social history 2) The patient's current medications and supplements 3) The patient's height, weight, and BMI have been recorded in the chart  

## 2021-02-19 LAB — HEPATITIS C ANTIBODY
Hepatitis C Ab: NONREACTIVE
SIGNAL TO CUT-OFF: 0.01 (ref <1.00)

## 2021-04-07 ENCOUNTER — Other Ambulatory Visit: Payer: Self-pay | Admitting: Internal Medicine

## 2021-04-07 NOTE — Telephone Encounter (Signed)
Please refill as per office routine med refill policy (all routine meds refilled for 3 mo or monthly per pt preference up to one year from last visit, then month to month grace period for 3 mo, then further med refills will have to be denied)  

## 2021-06-28 ENCOUNTER — Emergency Department (HOSPITAL_COMMUNITY): Payer: Medicare Other

## 2021-06-28 ENCOUNTER — Emergency Department (HOSPITAL_COMMUNITY)
Admission: EM | Admit: 2021-06-28 | Discharge: 2021-06-28 | Disposition: A | Discharge status: Home / Self Care | Payer: Medicare Other | Attending: Physician Assistant | Admitting: Physician Assistant

## 2021-06-28 ENCOUNTER — Other Ambulatory Visit: Payer: Self-pay

## 2021-06-28 ENCOUNTER — Telehealth: Payer: Self-pay | Admitting: Internal Medicine

## 2021-06-28 DIAGNOSIS — R062 Wheezing: Secondary | ICD-10-CM | POA: Diagnosis not present

## 2021-06-28 DIAGNOSIS — Z7982 Long term (current) use of aspirin: Secondary | ICD-10-CM | POA: Insufficient documentation

## 2021-06-28 DIAGNOSIS — N183 Chronic kidney disease, stage 3 unspecified: Secondary | ICD-10-CM | POA: Diagnosis not present

## 2021-06-28 DIAGNOSIS — R079 Chest pain, unspecified: Secondary | ICD-10-CM | POA: Diagnosis not present

## 2021-06-28 DIAGNOSIS — E1122 Type 2 diabetes mellitus with diabetic chronic kidney disease: Secondary | ICD-10-CM | POA: Insufficient documentation

## 2021-06-28 DIAGNOSIS — R059 Cough, unspecified: Secondary | ICD-10-CM | POA: Diagnosis not present

## 2021-06-28 DIAGNOSIS — J189 Pneumonia, unspecified organism: Secondary | ICD-10-CM | POA: Diagnosis not present

## 2021-06-28 DIAGNOSIS — I5032 Chronic diastolic (congestive) heart failure: Secondary | ICD-10-CM | POA: Diagnosis not present

## 2021-06-28 DIAGNOSIS — Z20822 Contact with and (suspected) exposure to covid-19: Secondary | ICD-10-CM | POA: Diagnosis not present

## 2021-06-28 DIAGNOSIS — R0602 Shortness of breath: Secondary | ICD-10-CM | POA: Diagnosis not present

## 2021-06-28 DIAGNOSIS — Z79899 Other long term (current) drug therapy: Secondary | ICD-10-CM | POA: Insufficient documentation

## 2021-06-28 DIAGNOSIS — R519 Headache, unspecified: Secondary | ICD-10-CM | POA: Diagnosis not present

## 2021-06-28 DIAGNOSIS — E039 Hypothyroidism, unspecified: Secondary | ICD-10-CM | POA: Diagnosis not present

## 2021-06-28 DIAGNOSIS — J029 Acute pharyngitis, unspecified: Secondary | ICD-10-CM | POA: Diagnosis not present

## 2021-06-28 DIAGNOSIS — I130 Hypertensive heart and chronic kidney disease with heart failure and stage 1 through stage 4 chronic kidney disease, or unspecified chronic kidney disease: Secondary | ICD-10-CM | POA: Diagnosis not present

## 2021-06-28 DIAGNOSIS — J45909 Unspecified asthma, uncomplicated: Secondary | ICD-10-CM | POA: Diagnosis not present

## 2021-06-28 DIAGNOSIS — Z96653 Presence of artificial knee joint, bilateral: Secondary | ICD-10-CM | POA: Diagnosis not present

## 2021-06-28 DIAGNOSIS — R0789 Other chest pain: Secondary | ICD-10-CM | POA: Diagnosis not present

## 2021-06-28 LAB — RESP PANEL BY RT-PCR (FLU A&B, COVID) ARPGX2
Influenza A by PCR: NEGATIVE
Influenza B by PCR: NEGATIVE
SARS Coronavirus 2 by RT PCR: NEGATIVE

## 2021-06-28 LAB — BASIC METABOLIC PANEL
Anion gap: 7 (ref 5–15)
BUN: 15 mg/dL (ref 8–23)
CO2: 27 mmol/L (ref 22–32)
Calcium: 9.5 mg/dL (ref 8.9–10.3)
Chloride: 105 mmol/L (ref 98–111)
Creatinine, Ser: 1.71 mg/dL — ABNORMAL HIGH (ref 0.44–1.00)
GFR, Estimated: 30 mL/min — ABNORMAL LOW (ref >60)
Glucose, Bld: 120 mg/dL — ABNORMAL HIGH (ref 70–99)
Potassium: 4.3 mmol/L (ref 3.5–5.1)
Sodium: 139 mmol/L (ref 135–145)

## 2021-06-28 LAB — CBC
HCT: 33.9 % — ABNORMAL LOW (ref 36.0–46.0)
Hemoglobin: 11.5 g/dL — ABNORMAL LOW (ref 12.0–15.0)
MCH: 30.8 pg (ref 26.0–34.0)
MCHC: 33.9 g/dL (ref 30.0–36.0)
MCV: 90.9 fL (ref 80.0–100.0)
Platelets: 151 10*3/uL (ref 150–400)
RBC: 3.73 MIL/uL — ABNORMAL LOW (ref 3.87–5.11)
RDW: 13.1 % (ref 11.5–15.5)
WBC: 10.3 10*3/uL (ref 4.0–10.5)
nRBC: 0 % (ref 0.0–0.2)

## 2021-06-28 LAB — TROPONIN I (HIGH SENSITIVITY): Troponin I (High Sensitivity): 9 ng/L (ref <18)

## 2021-06-28 MED ORDER — ALBUTEROL SULFATE HFA 108 (90 BASE) MCG/ACT IN AERS
2.0000 | INHALATION_SPRAY | Freq: Once | RESPIRATORY_TRACT | Status: AC
  Administered 2021-06-28: 2 via RESPIRATORY_TRACT
  Filled 2021-06-28: qty 6.7

## 2021-06-28 MED ORDER — DOXYCYCLINE HYCLATE 100 MG PO TABS: 100.0000 mg | ORAL_TABLET | Freq: Once | ORAL | Status: DC

## 2021-06-28 MED ORDER — AZITHROMYCIN 250 MG PO TABS: 250.0000 mg | ORAL_TABLET | Freq: Every day | ORAL | 0 refills | Status: AC

## 2021-06-28 MED ORDER — AMOXICILLIN-POT CLAVULANATE 875-125 MG PO TABS
1.0000 | ORAL_TABLET | Freq: Once | ORAL | Status: AC
  Administered 2021-06-28: 1 via ORAL
  Filled 2021-06-28: qty 1

## 2021-06-28 MED ORDER — ACETAMINOPHEN 500 MG PO TABS
1000.0000 mg | ORAL_TABLET | Freq: Once | ORAL | Status: AC
  Administered 2021-06-28: 1000 mg via ORAL
  Filled 2021-06-28: qty 2

## 2021-06-28 MED ORDER — ACETAMINOPHEN 500 MG PO TABS: 1000.0000 mg | ORAL_TABLET | Freq: Four times a day (QID) | ORAL | Status: DC | PRN

## 2021-06-28 MED ORDER — AZITHROMYCIN 250 MG PO TABS
500.0000 mg | ORAL_TABLET | Freq: Once | ORAL | Status: AC
  Administered 2021-06-28: 500 mg via ORAL
  Filled 2021-06-28: qty 2

## 2021-06-28 MED ORDER — AMOXICILLIN-POT CLAVULANATE 875-125 MG PO TABS: 1.0000 | ORAL_TABLET | Freq: Two times a day (BID) | ORAL | 0 refills | Status: AC

## 2021-06-28 NOTE — ED Notes (Signed)
Pt ambulated without any issues. No drop in oxygenation.

## 2021-06-28 NOTE — Discharge Instructions (Addendum)
Please follow up with your PCP. Take Augmentin twice a day for five days. Take azithromycin daily for the next four days. Use your albuterol inhaler as needed.

## 2021-06-28 NOTE — ED Provider Notes (Signed)
[facility]  TIMIYAH ROMITO , a 80 y.o. female  was evaluated in triage.  Pt complains of chest tightness and cough.  Pt has a history of asthma   Review of Systems  Positive: congestion Negative: fever  Physical Exam  BP (!) 159/77 (BP Location: Right Arm)   Pulse 84   Temp 99.5 F (37.5 C) (Oral)   Resp 16   SpO2 98%  Gen:   Awake, no distress   Resp:  Normal effort  MSK:   Moves extremities without difficulty  Other:    Medical Decision Making  Medically screening exam initiated at 9:20 AM.  Appropriate orders placed.  Westley Gambles was informed that the remainder of the evaluation will be completed by another provider, this initial triage assessment does not replace that evaluation, and the importance of remaining in the ED until their evaluation is complete.     Elson Areas, PA-C 06/28/21 [id number]    Rozelle Logan, DO 06/29/21 [id number]

## 2021-06-28 NOTE — ED Triage Notes (Signed)
Pt reports chest congestion with productive cough, yellow sputum. Has headache, congestion and sore throat.
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
## 2021-08-23 DIAGNOSIS — N183 Chronic kidney disease, stage 3 unspecified: Secondary | ICD-10-CM | POA: Diagnosis not present

## 2021-08-24 ENCOUNTER — Other Ambulatory Visit: Payer: Self-pay

## 2021-08-24 ENCOUNTER — Encounter: Payer: Self-pay | Admitting: Internal Medicine

## 2021-08-24 ENCOUNTER — Ambulatory Visit (INDEPENDENT_AMBULATORY_CARE_PROVIDER_SITE_OTHER): Payer: Medicare Other | Admitting: Internal Medicine

## 2021-08-24 VITALS — BP 128/76 | HR 65 | Temp 98.3°F | Ht 63.0 in | Wt 232.0 lb

## 2021-08-24 DIAGNOSIS — D509 Iron deficiency anemia, unspecified: Secondary | ICD-10-CM

## 2021-08-24 DIAGNOSIS — E559 Vitamin D deficiency, unspecified: Secondary | ICD-10-CM

## 2021-08-24 DIAGNOSIS — Z23 Encounter for immunization: Secondary | ICD-10-CM

## 2021-08-24 DIAGNOSIS — R7302 Impaired glucose tolerance (oral): Secondary | ICD-10-CM | POA: Diagnosis not present

## 2021-08-24 DIAGNOSIS — N1831 Chronic kidney disease, stage 3a: Secondary | ICD-10-CM | POA: Diagnosis not present

## 2021-08-24 DIAGNOSIS — E7800 Pure hypercholesterolemia, unspecified: Secondary | ICD-10-CM
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
## 2021-08-24 NOTE — Patient Instructions (Signed)
Please continue all other medications as before, and refills have been done if requested.  Please have the pharmacy call with any other refills you may need.  Please continue your efforts at being more active, low cholesterol diet, and weight control  Please keep your appointments with your specialists as you may have planned - renal on Nov 4  Please make an Appointment to return in 6 months, or sooner if needed

## 2021-08-28 ENCOUNTER — Encounter: Payer: Self-pay | Admitting: Internal Medicine

## 2021-08-28 NOTE — Assessment & Plan Note (Signed)
Lab Results  Component Value Date   HGBA1C 5.9 02/18/2021   Stable, pt to continue current medical treatment  - diet  

## 2021-08-28 NOTE — Assessment & Plan Note (Signed)
Lab Results  Component Value Date   CREATININE 1.71 (H) 06/28/2021   Stable overall, cont to avoid nephrotoxins

## 2021-08-28 NOTE — Assessment & Plan Note (Signed)
Lab Results  Component Value Date   LDLCALC 77 02/18/2021   Mild uncontrolled, goal ldl < 70, pt to continue current statin pravachol 20 as declines change

## 2021-08-28 NOTE — Assessment & Plan Note (Signed)
Also for lab f/u, no overt bleeding

## 2021-09-17 DIAGNOSIS — N183 Chronic kidney disease, stage 3 unspecified: Secondary | ICD-10-CM | POA: Diagnosis not present

## 2021-09-17 DIAGNOSIS — I129 Hypertensive chronic kidney disease with stage 1 through stage 4 chronic kidney disease, or unspecified chronic kidney disease: Secondary | ICD-10-CM | POA: Diagnosis not present

## 2021-09-17 DIAGNOSIS — E1122 Type 2 diabetes mellitus with diabetic chronic kidney disease: Secondary | ICD-10-CM | POA: Diagnosis not present

## 2021-09-21 ENCOUNTER — Other Ambulatory Visit: Payer: Self-pay | Admitting: Internal Medicine

## 2021-09-21 NOTE — Telephone Encounter (Signed)
Please refill as per office routine med refill policy (all routine meds to be refilled for 3 mo or monthly (per pt preference) up to one year from last visit, then month to month grace period for 3 mo, then further med refills will have to be denied) ? ?

## 2021-12-10 DIAGNOSIS — H04123 Dry eye syndrome of bilateral lacrimal glands: Secondary | ICD-10-CM | POA: Diagnosis not present

## 2021-12-10 DIAGNOSIS — H353131 Nonexudative age-related macular degeneration, bilateral, early dry stage: Secondary | ICD-10-CM | POA: Diagnosis not present

## 2021-12-10 DIAGNOSIS — H40013 Open angle with borderline findings, low risk, bilateral: Secondary | ICD-10-CM | POA: Diagnosis not present

## 2021-12-10 DIAGNOSIS — E119 Type 2 diabetes mellitus without complications: Secondary | ICD-10-CM | POA: Diagnosis not present

## 2021-12-10 LAB — HM DIABETES EYE EXAM

## 2021-12-15 ENCOUNTER — Ambulatory Visit: Payer: Medicare Other | Admitting: *Deleted

## 2021-12-15 ENCOUNTER — Other Ambulatory Visit: Payer: Self-pay

## 2021-12-15 DIAGNOSIS — Z23 Encounter for immunization: Secondary | ICD-10-CM

## 2021-12-15 NOTE — Progress Notes (Signed)
Patient presents for vaccine injection today. Patient tolerated injection well and was observed without any concerns.  

## 2021-12-21 ENCOUNTER — Other Ambulatory Visit: Payer: Self-pay

## 2021-12-21 ENCOUNTER — Ambulatory Visit (INDEPENDENT_AMBULATORY_CARE_PROVIDER_SITE_OTHER): Payer: Medicare Other | Admitting: Internal Medicine

## 2021-12-21 ENCOUNTER — Encounter: Payer: Self-pay | Admitting: Internal Medicine

## 2021-12-21 VITALS — BP 128/72 | HR 56 | Temp 98.3°F | Ht 63.0 in | Wt 234.0 lb

## 2021-12-21 DIAGNOSIS — R7302 Impaired glucose tolerance (oral): Secondary | ICD-10-CM

## 2021-12-21 DIAGNOSIS — I10 Essential (primary) hypertension: Secondary | ICD-10-CM

## 2021-12-21 DIAGNOSIS — G894 Chronic pain syndrome: Secondary | ICD-10-CM | POA: Diagnosis not present

## 2021-12-21 DIAGNOSIS — R202 Paresthesia of skin: Secondary | ICD-10-CM | POA: Diagnosis not present

## 2021-12-21 MED ORDER — GABAPENTIN 100 MG PO CAPS: ORAL_CAPSULE | ORAL | 1 refills | Status: DC

## 2021-12-21 MED ORDER — TIZANIDINE HCL 2 MG PO TABS: 2.0000 mg | ORAL_TABLET | Freq: Four times a day (QID) | ORAL | 2 refills | Status: DC | PRN

## 2021-12-21 NOTE — Assessment & Plan Note (Signed)
Also for flexeril prn,  to f/u any worsening symptoms or concerns

## 2021-12-21 NOTE — Assessment & Plan Note (Signed)
Lab Results  Component Value Date   HGBA1C 5.9 02/18/2021   Stable, pt to continue current medical treatment  - diet  

## 2021-12-21 NOTE — Assessment & Plan Note (Signed)
BP Readings from Last 3 Encounters:  12/21/21 128/72  08/24/21 128/76  06/28/21 (!) 145/75   Stable, pt to continue medical treatment tenormin

## 2021-12-21 NOTE — Patient Instructions (Addendum)
Please take all new medication as prescribed  - the muscle relaxer as needed  Ok to increase the gabapentin to 100 mg twice per day then 200 mg at bedtime  Please continue all other medications as before, and refills have been done if requested.  Please have the pharmacy call with any other refills you may need.  Please continue your efforts at being more active, low cholesterol diet, and weight control.  You are otherwise up to date with prevention measures today.  Please keep your appointments with your specialists as you may have planned - Dr Tamala Julian for the cortisone injections  You will be contacted regarding the referral for: Neurology for the nerve testing  We'll see you back in April as planned

## 2021-12-21 NOTE — Assessment & Plan Note (Signed)
Etiology unclear but likely peripheral neuritic, just not clear how much is radicular vs more distal CTS and /or distal peripheral neuropathy; for gabapentin 100 twice per day and 200 at beditme, refer neurology for possible EMG/NCS
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
## 2021-12-31 ENCOUNTER — Telehealth: Payer: Self-pay

## 2021-12-31 NOTE — Telephone Encounter (Signed)
Patient notified via voicemail of provider's recommendations

## 2021-12-31 NOTE — Telephone Encounter (Signed)
Pt is stating that she coughing and SOB.  Pt decline appt for 2/20 @ 11:00. States that she is sick now. I called [facility].

## 2021-12-31 NOTE — Telephone Encounter (Signed)
Needs OV or consider ED or UC

## 2022-01-03 ENCOUNTER — Ambulatory Visit (INDEPENDENT_AMBULATORY_CARE_PROVIDER_SITE_OTHER): Payer: Medicare Other | Admitting: Internal Medicine

## 2022-01-03 ENCOUNTER — Encounter: Payer: Self-pay | Admitting: Internal Medicine

## 2022-01-03 ENCOUNTER — Other Ambulatory Visit: Payer: Self-pay

## 2022-01-03 VITALS — BP 150/80 | HR 58 | Temp 100.2°F | Ht 63.0 in | Wt 224.0 lb

## 2022-01-03 DIAGNOSIS — I10 Essential (primary) hypertension: Secondary | ICD-10-CM | POA: Diagnosis not present

## 2022-01-03 DIAGNOSIS — R051 Acute cough: Secondary | ICD-10-CM

## 2022-01-03 DIAGNOSIS — R7302 Impaired glucose tolerance (oral): Secondary | ICD-10-CM

## 2022-01-03 DIAGNOSIS — J4541 Moderate persistent asthma with (acute) exacerbation: Secondary | ICD-10-CM | POA: Diagnosis not present

## 2022-01-03 MED ORDER — ALBUTEROL SULFATE HFA 108 (90 BASE) MCG/ACT IN AERS: 2.0000 | INHALATION_SPRAY | Freq: Four times a day (QID) | RESPIRATORY_TRACT | 3 refills | Status: DC | PRN

## 2022-01-03 MED ORDER — METHYLPREDNISOLONE ACETATE 80 MG/ML IJ SUSP
80.0000 mg | Freq: Once | INTRAMUSCULAR | Status: AC
  Administered 2022-01-03: 80 mg via INTRAMUSCULAR

## 2022-01-03 MED ORDER — PREDNISONE 10 MG PO TABS: ORAL_TABLET | ORAL | 0 refills | Status: DC

## 2022-01-03 MED ORDER — HYDROCODONE BIT-HOMATROP MBR 5-1.5 MG/5ML PO SOLN: 5.0000 mL | Freq: Four times a day (QID) | ORAL | 0 refills | Status: AC | PRN

## 2022-01-03 MED ORDER — DOXYCYCLINE HYCLATE 100 MG PO TABS: 100.0000 mg | ORAL_TABLET | Freq: Two times a day (BID) | ORAL | 0 refills | Status: DC
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
## 2022-01-03 NOTE — Patient Instructions (Signed)
You had the steroid shot today  Please take all new medication as prescribed - the antibiotic, cough medicine, prednisone, and inhaler as needed  Please continue all other medications as before, and refills have been done if requested.  Please have the pharmacy call with any other refills you may need.  Please keep your appointments with your specialists as you may have planned  You are given the work note today

## 2022-01-09 ENCOUNTER — Encounter: Payer: Self-pay | Admitting: Internal Medicine

## 2022-01-09 NOTE — Assessment & Plan Note (Signed)
BP Readings from Last 3 Encounters:  01/03/22 (!) 150/80  12/21/21 128/72  08/24/21 128/76   Uncontrolled today, likely reactive, pt to continue medical treatment tenormin

## 2022-01-09 NOTE — Assessment & Plan Note (Signed)
Lab Results  Component Value Date   HGBA1C 5.9 02/18/2021   Stable, pt to continue current medical treatment  - diet  

## 2022-01-09 NOTE — Assessment & Plan Note (Signed)
Mild to mod, for depomedrol im 80, prednisone, inhaler prn, work note,  to f/u any worsening symptoms or concerns

## 2022-01-09 NOTE — Assessment & Plan Note (Signed)
Mild to mod, for antibx course, cough med prn, to f/u any worsening symptoms or concerns 

## 2022-02-01 ENCOUNTER — Telehealth: Payer: Self-pay | Admitting: Internal Medicine

## 2022-02-01 DIAGNOSIS — R202 Paresthesia of skin: Secondary | ICD-10-CM

## 2022-02-01 NOTE — Telephone Encounter (Signed)
Patient calling in ? ?Patient had referral submitted to [facility] for a NCV/EMG study but appt was cx ? ?Contacted [facility] & they advised appt was cx bc they no longer have a staff person to perform study & patient would need to have referral sent elsewhere ? ?Please send new referral to neurospecialist that can possibly see patient soon.. patient says she has been waiting quite a while for this appt ? ?Please fu [phone] ?

## 2022-02-01 NOTE — Telephone Encounter (Signed)
Ok will re do referral ?

## 2022-02-03 NOTE — Telephone Encounter (Signed)
Unable to reach patient or leave a message.

## 2022-02-07 ENCOUNTER — Ambulatory Visit: Payer: Medicare Other | Admitting: Neurology

## 2022-02-08 ENCOUNTER — Telehealth: Payer: Self-pay

## 2022-02-08 NOTE — Telephone Encounter (Signed)
Pt is requesting to see if she can have a referral to Neuro for a NCV/EMG study that she can be seen sooner than Aug. ? ?Please advise ?

## 2022-02-16 NOTE — Telephone Encounter (Signed)
Pt states she would prefer [facility]. ?

## 2022-02-21 NOTE — Telephone Encounter (Signed)
Pt states that the referral needs to be faxed to [phone] ?

## 2022-02-23 ENCOUNTER — Encounter: Payer: Self-pay | Admitting: Internal Medicine

## 2022-02-23 ENCOUNTER — Ambulatory Visit (INDEPENDENT_AMBULATORY_CARE_PROVIDER_SITE_OTHER): Payer: Medicare Other | Admitting: Internal Medicine

## 2022-02-23 VITALS — BP 128/74 | HR 73 | Temp 98.4°F | Ht 63.0 in | Wt 229.0 lb

## 2022-02-23 DIAGNOSIS — E7800 Pure hypercholesterolemia, unspecified: Secondary | ICD-10-CM | POA: Diagnosis not present

## 2022-02-23 DIAGNOSIS — Z0001 Encounter for general adult medical examination with abnormal findings: Secondary | ICD-10-CM | POA: Diagnosis not present

## 2022-02-23 DIAGNOSIS — R413 Other amnesia: Secondary | ICD-10-CM

## 2022-02-23 DIAGNOSIS — I10 Essential (primary) hypertension: Secondary | ICD-10-CM | POA: Diagnosis not present

## 2022-02-23 DIAGNOSIS — M5412 Radiculopathy, cervical region: Secondary | ICD-10-CM

## 2022-02-23 DIAGNOSIS — E559 Vitamin D deficiency, unspecified: Secondary | ICD-10-CM

## 2022-02-23 DIAGNOSIS — R7302 Impaired glucose tolerance (oral): Secondary | ICD-10-CM

## 2022-02-23 LAB — BASIC METABOLIC PANEL
BUN: 24 mg/dL — ABNORMAL HIGH (ref 6–23)
CO2: 29 mEq/L (ref 19–32)
Calcium: 9.8 mg/dL (ref 8.4–10.5)
Chloride: 106 mEq/L (ref 96–112)
Creatinine, Ser: 1.75 mg/dL — ABNORMAL HIGH (ref 0.40–1.20)
GFR: 27.19 mL/min — ABNORMAL LOW (ref >60.00)
Glucose, Bld: 101 mg/dL — ABNORMAL HIGH (ref 70–99)
Potassium: 4.7 mEq/L (ref 3.5–5.1)
Sodium: 142 mEq/L (ref 135–145)

## 2022-02-23 LAB — URINALYSIS, ROUTINE W REFLEX MICROSCOPIC
Bilirubin Urine: NEGATIVE
Ketones, ur: NEGATIVE
Nitrite: NEGATIVE
Specific Gravity, Urine: 1.015 (ref 1.000–1.030)
Total Protein, Urine: NEGATIVE
Urine Glucose: NEGATIVE
Urobilinogen, UA: 0.2 (ref 0.0–1.0)
pH: 6 (ref 5.0–8.0)

## 2022-02-23 LAB — CBC WITH DIFFERENTIAL/PLATELET
Basophils Absolute: 0 10*3/uL (ref 0.0–0.1)
Basophils Relative: 0.6 % (ref 0.0–3.0)
Eosinophils Absolute: 0.1 10*3/uL (ref 0.0–0.7)
Eosinophils Relative: 1 % (ref 0.0–5.0)
HCT: 31.3 % — ABNORMAL LOW (ref 36.0–46.0)
Hemoglobin: 10.4 g/dL — ABNORMAL LOW (ref 12.0–15.0)
Lymphocytes Relative: 22.5 % (ref 12.0–46.0)
Lymphs Abs: 1.3 10*3/uL (ref 0.7–4.0)
MCHC: 33.1 g/dL (ref 30.0–36.0)
MCV: 89 fl (ref 78.0–100.0)
Monocytes Absolute: 0.6 10*3/uL (ref 0.1–1.0)
Monocytes Relative: 9.7 % (ref 3.0–12.0)
Neutro Abs: 3.9 10*3/uL (ref 1.4–7.7)
Neutrophils Relative %: 66.2 % (ref 43.0–77.0)
Platelets: 163 10*3/uL (ref 150.0–400.0)
RBC: 3.52 Mil/uL — ABNORMAL LOW (ref 3.87–5.11)
RDW: 13.8 % (ref 11.5–15.5)
WBC: 5.8 10*3/uL (ref 4.0–10.5)

## 2022-02-23 LAB — LIPID PANEL
Cholesterol: 144 mg/dL (ref 0–200)
HDL: 45 mg/dL (ref >39.00)
LDL Cholesterol: 81 mg/dL (ref 0–99)
NonHDL: 98.62
Total CHOL/HDL Ratio: 3
Triglycerides: 89 mg/dL (ref 0.0–149.0)
VLDL: 17.8 mg/dL (ref 0.0–40.0)

## 2022-02-23 LAB — VITAMIN D 25 HYDROXY (VIT D DEFICIENCY, FRACTURES): VITD: 53.29 ng/mL (ref 30.00–100.00)

## 2022-02-23 LAB — TSH: TSH: 2.44 u[IU]/mL (ref 0.35–5.50)

## 2022-02-23 LAB — HEPATIC FUNCTION PANEL
ALT: 16 U/L (ref 0–35)
AST: 24 U/L (ref 0–37)
Albumin: 4 g/dL (ref 3.5–5.2)
Alkaline Phosphatase: 61 U/L (ref 39–117)
Bilirubin, Direct: 0.1 mg/dL (ref 0.0–0.3)
Total Bilirubin: 0.3 mg/dL (ref 0.2–1.2)
Total Protein: 7.4 g/dL (ref 6.0–8.3)

## 2022-02-23 LAB — VITAMIN B12: Vitamin B-12: 544 pg/mL (ref 211–911)

## 2022-02-23 LAB — HEMOGLOBIN A1C: Hgb A1c MFr Bld: 6.4 % (ref 4.6–6.5)

## 2022-02-23 NOTE — Assessment & Plan Note (Signed)
Last vitamin D ?Lab Results  ?Component Value Date  ? VD25OH 53.29 02/23/2022  ? ?Stable, cont oral replacement ? ?

## 2022-02-23 NOTE — Patient Instructions (Signed)
Please continue all other medications as before, and refills have been done if requested. ? ?Please have the pharmacy call with any other refills you may need. ? ?Please continue your efforts at being more active, low cholesterol diet, and weight control. ? ?You are otherwise up to date with prevention measures today. ? ?Please keep your appointments with your specialists as you may have planned  - Neurology next month ? ?You will be contacted regarding the referral for: MRI for the brain, and cervical spine (neck) ? ?Please go to the LAB at the blood drawing area for the tests to be done ? ?You will be contacted by phone if any changes need to be made immediately.  Otherwise, you will receive a letter about your results with an explanation, but please check with MyChart first. ? ?Please remember to sign up for MyChart if you have not done so, as this will be important to you in the future with finding out test results, communicating by private email, and scheduling acute appointments online when needed. ? ?Please make an Appointment to return in 6 months, or sooner if needed ?

## 2022-02-23 NOTE — Assessment & Plan Note (Signed)
Worsening last 6 mo, also for MRI brain, b12 level, and refer neurology ?

## 2022-02-23 NOTE — Assessment & Plan Note (Signed)
Worsening, need updated MRI, and refer neurology ?

## 2022-02-23 NOTE — Assessment & Plan Note (Signed)
BP Readings from Last 3 Encounters:  ?02/23/22 128/74  ?01/03/22 (!) 150/80  ?12/21/21 128/72  ? ?Stable, pt to continue medical treatment tenormin ? ?

## 2022-02-23 NOTE — Assessment & Plan Note (Signed)

## 2022-02-23 NOTE — Progress Notes (Signed)
Patient ID: [name], female   DOB: 1941-03-23, 81 y.o.   MRN: [id number] ? ?
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
## 2022-02-23 NOTE — Assessment & Plan Note (Signed)
Lab Results  ?Component Value Date  ? HGBA1C 6.4 02/23/2022  ? ?Stable, pt to continue current medical treatment  - diet ? ?

## 2022-02-23 NOTE — Assessment & Plan Note (Signed)
Lab Results  ?Component Value Date  ? LDLCALC 81 02/23/2022  ? ?Stable, pt to continue current statin pravachol 20 as declines change today ? ?

## 2022-03-04 ENCOUNTER — Telehealth: Payer: Self-pay | Admitting: Internal Medicine

## 2022-03-04 NOTE — Telephone Encounter (Signed)
Left message for patient to call back to schedule Medicare Annual Wellness Visit   Last AWV  04/15/11  Please schedule at anytime with [facility]-Nurse Health Advisor if patient calls the office back.      Any questions, please call me at [phone] 

## 2022-03-06 ENCOUNTER — Ambulatory Visit
Admission: RE | Admit: 2022-03-06 | Discharge: 2022-03-06 | Disposition: A | Discharge status: Home / Self Care | Payer: Medicare Other | Source: Ambulatory Visit | Attending: Internal Medicine | Admitting: Internal Medicine

## 2022-03-06 DIAGNOSIS — R413 Other amnesia: Secondary | ICD-10-CM | POA: Diagnosis not present

## 2022-03-06 DIAGNOSIS — M5412 Radiculopathy, cervical region: Secondary | ICD-10-CM

## 2022-03-06 DIAGNOSIS — M542 Cervicalgia: Secondary | ICD-10-CM | POA: Diagnosis not present

## 2022-06-08 DIAGNOSIS — R200 Anesthesia of skin: Secondary | ICD-10-CM | POA: Diagnosis not present

## 2022-06-08 DIAGNOSIS — G5602 Carpal tunnel syndrome, left upper limb: Secondary | ICD-10-CM | POA: Diagnosis not present

## 2022-06-08 DIAGNOSIS — R202 Paresthesia of skin: Secondary | ICD-10-CM | POA: Diagnosis not present

## 2022-06-21 ENCOUNTER — Ambulatory Visit: Payer: Medicare Other | Admitting: Internal Medicine

## 2022-06-27 ENCOUNTER — Ambulatory Visit (INDEPENDENT_AMBULATORY_CARE_PROVIDER_SITE_OTHER): Payer: Medicare Other | Admitting: Internal Medicine

## 2022-06-27 VITALS — BP 132/78 | HR 53 | Temp 98.3°F | Ht 63.0 in | Wt 221.0 lb

## 2022-06-27 DIAGNOSIS — M542 Cervicalgia: Secondary | ICD-10-CM | POA: Diagnosis not present

## 2022-06-27 DIAGNOSIS — G894 Chronic pain syndrome: Secondary | ICD-10-CM | POA: Diagnosis not present

## 2022-06-27 DIAGNOSIS — G629 Polyneuropathy, unspecified: Secondary | ICD-10-CM

## 2022-06-27 DIAGNOSIS — M25511 Pain in right shoulder: Secondary | ICD-10-CM

## 2022-06-27 DIAGNOSIS — R7302 Impaired glucose tolerance (oral): Secondary | ICD-10-CM | POA: Diagnosis not present

## 2022-06-27 DIAGNOSIS — G5602 Carpal tunnel syndrome, left upper limb: Secondary | ICD-10-CM | POA: Diagnosis not present

## 2022-06-27 DIAGNOSIS — I10 Essential (primary) hypertension: Secondary | ICD-10-CM

## 2022-06-27 MED ORDER — TRAMADOL HCL 50 MG PO TABS: 50.0000 mg | ORAL_TABLET | Freq: Four times a day (QID) | ORAL | 0 refills | Status: DC | PRN
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
## 2022-06-27 NOTE — Patient Instructions (Addendum)
Please take all new medication as prescribed  - the tramadol for pain (and call for refill If needed)  Please continue all other medications as before, and refills have been done if requested.  Please have the pharmacy call with any other refills you may need.  Please continue your efforts at being more active, low cholesterol diet, and weight control.  You are otherwise up to date with prevention measures today.  Please keep your appointments with your specialists as you may have planned  You will be contacted regarding the referral for: Hand surgury for the left carpal tunnell, Sports Medicine for the neck and right shoulder, and Neurology for the polyneuropathy  Please make an Appointment to return in 6 months, or sooner if needed

## 2022-07-01 ENCOUNTER — Encounter: Payer: Self-pay | Admitting: Internal Medicine

## 2022-07-01 NOTE — Assessment & Plan Note (Signed)
Also for tramadol asd prn, refer sport medicine, and neurology

## 2022-07-01 NOTE — Assessment & Plan Note (Signed)
BP Readings from Last 3 Encounters:  06/27/22 132/78  02/23/22 128/74  01/03/22 (!) 150/80   Stable, pt to continue medical treatment tenormin 100 gm qd

## 2022-07-01 NOTE — Assessment & Plan Note (Signed)
Now moderate persistent, refer hand surgury

## 2022-07-01 NOTE — Assessment & Plan Note (Signed)
Lab Results  Component Value Date   HGBA1C 6.4 02/23/2022   Stable, pt to continue current medical treatment  - diet, wt control, excercise

## 2022-07-22 ENCOUNTER — Ambulatory Visit: Payer: Medicare Other | Admitting: Family Medicine

## 2022-08-25 ENCOUNTER — Ambulatory Visit: Payer: Medicare Other | Admitting: Internal Medicine
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
## 2022-09-05 ENCOUNTER — Encounter: Payer: Self-pay | Admitting: Family Medicine

## 2022-09-05 ENCOUNTER — Ambulatory Visit: Payer: Medicare Other | Admitting: Family Medicine

## 2022-09-05 ENCOUNTER — Ambulatory Visit: Payer: Self-pay

## 2022-09-05 VITALS — BP 128/88 | HR 55 | Ht 63.0 in | Wt 215.0 lb

## 2022-09-05 DIAGNOSIS — E538 Deficiency of other specified B group vitamins: Secondary | ICD-10-CM | POA: Diagnosis not present

## 2022-09-05 DIAGNOSIS — G5602 Carpal tunnel syndrome, left upper limb: Secondary | ICD-10-CM

## 2022-09-05 DIAGNOSIS — M25511 Pain in right shoulder: Secondary | ICD-10-CM

## 2022-09-05 DIAGNOSIS — M2550 Pain in unspecified joint: Secondary | ICD-10-CM

## 2022-09-05 LAB — COMPREHENSIVE METABOLIC PANEL
ALT: 22 U/L (ref 0–35)
AST: 30 U/L (ref 0–37)
Albumin: 4.1 g/dL (ref 3.5–5.2)
Alkaline Phosphatase: 63 U/L (ref 39–117)
BUN: 20 mg/dL (ref 6–23)
CO2: 28 mEq/L (ref 19–32)
Calcium: 9.7 mg/dL (ref 8.4–10.5)
Chloride: 107 mEq/L (ref 96–112)
Creatinine, Ser: 1.6 mg/dL — ABNORMAL HIGH (ref 0.40–1.20)
GFR: 30.17 mL/min — ABNORMAL LOW (ref >60.00)
Glucose, Bld: 101 mg/dL — ABNORMAL HIGH (ref 70–99)
Potassium: 4.4 mEq/L (ref 3.5–5.1)
Sodium: 142 mEq/L (ref 135–145)
Total Bilirubin: 0.3 mg/dL (ref 0.2–1.2)
Total Protein: 7.8 g/dL (ref 6.0–8.3)

## 2022-09-05 LAB — IBC PANEL
Iron: 55 ug/dL (ref 42–145)
Saturation Ratios: 17.9 % — ABNORMAL LOW (ref 20.0–50.0)
TIBC: 308 ug/dL (ref 250.0–450.0)
Transferrin: 220 mg/dL (ref 212.0–360.0)

## 2022-09-05 LAB — CBC WITH DIFFERENTIAL/PLATELET
Basophils Absolute: 0 10*3/uL (ref 0.0–0.1)
Basophils Relative: 0.2 % (ref 0.0–3.0)
Eosinophils Absolute: 0.1 10*3/uL (ref 0.0–0.7)
Eosinophils Relative: 1.1 % (ref 0.0–5.0)
HCT: 34.5 % — ABNORMAL LOW (ref 36.0–46.0)
Hemoglobin: 11.2 g/dL — ABNORMAL LOW (ref 12.0–15.0)
Lymphocytes Relative: 22.1 % (ref 12.0–46.0)
Lymphs Abs: 1.2 10*3/uL (ref 0.7–4.0)
MCHC: 32.5 g/dL (ref 30.0–36.0)
MCV: 91.4 fl (ref 78.0–100.0)
Monocytes Absolute: 0.7 10*3/uL (ref 0.1–1.0)
Monocytes Relative: 11.8 % (ref 3.0–12.0)
Neutro Abs: 3.7 10*3/uL (ref 1.4–7.7)
Neutrophils Relative %: 64.8 % (ref 43.0–77.0)
Platelets: 176 10*3/uL (ref 150.0–400.0)
RBC: 3.77 Mil/uL — ABNORMAL LOW (ref 3.87–5.11)
RDW: 13.3 % (ref 11.5–15.5)
WBC: 5.6 10*3/uL (ref 4.0–10.5)

## 2022-09-05 LAB — FERRITIN: Ferritin: 312.8 ng/mL — ABNORMAL HIGH (ref 10.0–291.0)

## 2022-09-05 LAB — B12 AND FOLATE PANEL
Folate: >23.8 ng/mL (ref >5.9)
Vitamin B-12: >1500 pg/mL — ABNORMAL HIGH (ref 211–911)

## 2022-09-05 MED ORDER — CYANOCOBALAMIN 1000 MCG/ML IJ SOLN
1000.0000 ug | Freq: Once | INTRAMUSCULAR | Status: AC
  Administered 2022-09-05: 1000 ug via INTRAMUSCULAR

## 2022-09-05 NOTE — Assessment & Plan Note (Signed)
Patient given injection and tolerated the procedure well, discussed icing regimen and home exercises, discussed which activities to do and which ones to avoid.  Increase activity slowly.  Follow-up again in 6 to 8 weeks.  Brace given for this problem that is a chronic problem with worsening symptoms.

## 2022-09-05 NOTE — Assessment & Plan Note (Signed)
6 months ago patient was starting to have lower B12 and is having more of a peripheral neuropathy based on nerve conduction study that was further evaluated.  We will give her a B12 injection today and see how patient responds.  We will get laboratory work-up as well to make sure everything else is seems to be doing relatively well.  Follow-up with me again in 6 to 8 weeks

## 2022-09-05 NOTE — Patient Instructions (Signed)
Labs today No more than 5lbs with left arm Exercises 3x a week See me in 2 months

## 2022-09-11 ENCOUNTER — Other Ambulatory Visit: Payer: Self-pay | Admitting: Internal Medicine

## 2022-09-11 NOTE — Telephone Encounter (Signed)
Please refill as per office routine med refill policy (all routine meds to be refilled for 3 mo or monthly (per pt preference) up to one year from last visit, then month to month grace period for 3 mo, then further med refills will have to be denied) ? ?

## 2022-09-12 NOTE — Telephone Encounter (Signed)
Please refill as per office routine med refill policy (all routine meds to be refilled for 3 mo or monthly (per pt preference) up to one year from last visit, then month to month grace period for 3 mo, then further med refills will have to be denied) ? ?
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
## 2022-11-10 ENCOUNTER — Ambulatory Visit: Payer: Medicare Other | Admitting: Family Medicine

## 2022-11-10 ENCOUNTER — Encounter: Payer: Self-pay | Admitting: Family Medicine

## 2022-11-10 ENCOUNTER — Ambulatory Visit: Payer: Self-pay

## 2022-11-10 VITALS — BP 114/78 | HR 56 | Ht 63.0 in | Wt 209.0 lb

## 2022-11-10 DIAGNOSIS — M19019 Primary osteoarthritis, unspecified shoulder: Secondary | ICD-10-CM

## 2022-11-10 DIAGNOSIS — G5602 Carpal tunnel syndrome, left upper limb: Secondary | ICD-10-CM | POA: Diagnosis not present

## 2022-11-10 DIAGNOSIS — E538 Deficiency of other specified B group vitamins: Secondary | ICD-10-CM | POA: Diagnosis not present

## 2022-11-10 DIAGNOSIS — M25532 Pain in left wrist: Secondary | ICD-10-CM | POA: Diagnosis not present

## 2022-11-10 MED ORDER — CYANOCOBALAMIN 1000 MCG/ML IJ SOLN
1000.0000 ug | Freq: Once | INTRAMUSCULAR | Status: AC
  Administered 2022-11-10: 1000 ug via INTRAMUSCULAR

## 2022-11-10 NOTE — Patient Instructions (Addendum)
Referral to Dr. Amanda Pea Note: severe carpal tunnel referred to ortho

## 2022-11-10 NOTE — Assessment & Plan Note (Signed)
Patient did have short-term improvement noted previously.  We discussed with patient about icing regimen and home exercises.  Patient has failed all conservative therapy including the review injection and will be referred to orthopedic surgery to discuss surgical intervention of the carpal tunnel.  Patient does not have it on the contralateral side as well that may need

## 2022-11-10 NOTE — Addendum Note (Signed)
Addended by: Evon Slack on: 11/10/2022 08:54 AM   Modules accepted: Orders

## 2022-11-10 NOTE — Assessment & Plan Note (Signed)
Discussed with patient again about possible injections.  Patient will consider doing it monthly again with primary care if you would like to start again.

## 2022-11-22 ENCOUNTER — Telehealth: Payer: Self-pay | Admitting: Internal Medicine

## 2022-11-22 NOTE — Telephone Encounter (Signed)
Left message for patient to call back to schedule Medicare Annual Wellness Visit   Last AWV  04/15/11  Please schedule at anytime with LB Jansen if patient calls the office back.      Any questions, please call me at [phone]

## 2022-12-12 DIAGNOSIS — H353131 Nonexudative age-related macular degeneration, bilateral, early dry stage: Secondary | ICD-10-CM | POA: Diagnosis not present

## 2022-12-12 DIAGNOSIS — H40013 Open angle with borderline findings, low risk, bilateral: Secondary | ICD-10-CM | POA: Diagnosis not present

## 2022-12-12 DIAGNOSIS — H04123 Dry eye syndrome of bilateral lacrimal glands: Secondary | ICD-10-CM | POA: Diagnosis not present

## 2022-12-12 DIAGNOSIS — H524 Presbyopia: Secondary | ICD-10-CM | POA: Diagnosis not present

## 2022-12-12 DIAGNOSIS — E119 Type 2 diabetes mellitus without complications: Secondary | ICD-10-CM | POA: Diagnosis not present

## 2022-12-12 LAB — HM DIABETES EYE EXAM

## 2022-12-15 ENCOUNTER — Ambulatory Visit: Payer: Medicare Other

## 2022-12-16 DIAGNOSIS — E1122 Type 2 diabetes mellitus with diabetic chronic kidney disease: Secondary | ICD-10-CM | POA: Diagnosis not present

## 2022-12-16 DIAGNOSIS — I129 Hypertensive chronic kidney disease with stage 1 through stage 4 chronic kidney disease, or unspecified chronic kidney disease: Secondary | ICD-10-CM | POA: Diagnosis not present

## 2022-12-16 DIAGNOSIS — N183 Chronic kidney disease, stage 3 unspecified: Secondary | ICD-10-CM | POA: Diagnosis not present

## 2022-12-28 ENCOUNTER — Ambulatory Visit (INDEPENDENT_AMBULATORY_CARE_PROVIDER_SITE_OTHER): Payer: Medicare Other | Admitting: Internal Medicine

## 2022-12-28 VITALS — BP 122/78 | HR 57 | Temp 98.1°F | Ht 63.0 in | Wt 207.0 lb

## 2022-12-28 DIAGNOSIS — N1831 Chronic kidney disease, stage 3a: Secondary | ICD-10-CM | POA: Diagnosis not present

## 2022-12-28 DIAGNOSIS — R7302 Impaired glucose tolerance (oral): Secondary | ICD-10-CM

## 2022-12-28 DIAGNOSIS — Z0001 Encounter for general adult medical examination with abnormal findings: Secondary | ICD-10-CM

## 2022-12-28 DIAGNOSIS — E7800 Pure hypercholesterolemia, unspecified: Secondary | ICD-10-CM | POA: Diagnosis not present

## 2022-12-28 DIAGNOSIS — I5032 Chronic diastolic (congestive) heart failure: Secondary | ICD-10-CM | POA: Diagnosis not present

## 2022-12-28 DIAGNOSIS — I10 Essential (primary) hypertension: Secondary | ICD-10-CM

## 2022-12-28 DIAGNOSIS — Z23 Encounter for immunization: Secondary | ICD-10-CM | POA: Diagnosis not present

## 2022-12-28 DIAGNOSIS — G894 Chronic pain syndrome: Secondary | ICD-10-CM | POA: Diagnosis not present

## 2022-12-28 DIAGNOSIS — G5602 Carpal tunnel syndrome, left upper limb: Secondary | ICD-10-CM | POA: Insufficient documentation

## 2022-12-28 DIAGNOSIS — E538 Deficiency of other specified B group vitamins: Secondary | ICD-10-CM | POA: Diagnosis not present

## 2022-12-28 DIAGNOSIS — E559 Vitamin D deficiency, unspecified: Secondary | ICD-10-CM | POA: Diagnosis not present

## 2022-12-28 DIAGNOSIS — G5603 Carpal tunnel syndrome, bilateral upper limbs: Secondary | ICD-10-CM

## 2022-12-28 LAB — BASIC METABOLIC PANEL
BUN: 22 mg/dL (ref 6–23)
CO2: 30 mEq/L (ref 19–32)
Calcium: 9.8 mg/dL (ref 8.4–10.5)
Chloride: 105 mEq/L (ref 96–112)
Creatinine, Ser: 1.85 mg/dL — ABNORMAL HIGH (ref 0.40–1.20)
GFR: 25.29 mL/min — ABNORMAL LOW (ref >60.00)
Glucose, Bld: 93 mg/dL (ref 70–99)
Potassium: 4.7 mEq/L (ref 3.5–5.1)
Sodium: 141 mEq/L (ref 135–145)

## 2022-12-28 LAB — URINALYSIS, ROUTINE W REFLEX MICROSCOPIC
Bilirubin Urine: NEGATIVE
Hgb urine dipstick: NEGATIVE
Ketones, ur: NEGATIVE
Nitrite: NEGATIVE
Specific Gravity, Urine: 1.02 (ref 1.000–1.030)
Total Protein, Urine: NEGATIVE
Urine Glucose: NEGATIVE
Urobilinogen, UA: 0.2 (ref 0.0–1.0)
pH: 6 (ref 5.0–8.0)

## 2022-12-28 LAB — TSH: TSH: 1.58 u[IU]/mL (ref 0.35–5.50)

## 2022-12-28 LAB — CBC WITH DIFFERENTIAL/PLATELET
Basophils Absolute: 0 10*3/uL (ref 0.0–0.1)
Basophils Relative: 0.4 % (ref 0.0–3.0)
Eosinophils Absolute: 0.1 10*3/uL (ref 0.0–0.7)
Eosinophils Relative: 0.9 % (ref 0.0–5.0)
HCT: 32.9 % — ABNORMAL LOW (ref 36.0–46.0)
Hemoglobin: 10.9 g/dL — ABNORMAL LOW (ref 12.0–15.0)
Lymphocytes Relative: 26.1 % (ref 12.0–46.0)
Lymphs Abs: 1.8 10*3/uL (ref 0.7–4.0)
MCHC: 33.2 g/dL (ref 30.0–36.0)
MCV: 90 fl (ref 78.0–100.0)
Monocytes Absolute: 0.8 10*3/uL (ref 0.1–1.0)
Monocytes Relative: 11.2 % (ref 3.0–12.0)
Neutro Abs: 4.1 10*3/uL (ref 1.4–7.7)
Neutrophils Relative %: 61.4 % (ref 43.0–77.0)
Platelets: 180 10*3/uL (ref 150.0–400.0)
RBC: 3.66 Mil/uL — ABNORMAL LOW (ref 3.87–5.11)
RDW: 13.8 % (ref 11.5–15.5)
WBC: 6.7 10*3/uL (ref 4.0–10.5)

## 2022-12-28 LAB — VITAMIN B12: Vitamin B-12: 404 pg/mL (ref 211–911)

## 2022-12-28 LAB — MICROALBUMIN / CREATININE URINE RATIO
Creatinine,U: 116.7 mg/dL
Microalb Creat Ratio: 0.6 mg/g (ref 0.0–30.0)
Microalb, Ur: 0.7 mg/dL (ref 0.0–1.9)

## 2022-12-28 LAB — HEPATIC FUNCTION PANEL
ALT: 17 U/L (ref 0–35)
AST: 23 U/L (ref 0–37)
Albumin: 3.9 g/dL (ref 3.5–5.2)
Alkaline Phosphatase: 58 U/L (ref 39–117)
Bilirubin, Direct: 0.1 mg/dL (ref 0.0–0.3)
Total Bilirubin: 0.4 mg/dL (ref 0.2–1.2)
Total Protein: 7.6 g/dL (ref 6.0–8.3)

## 2022-12-28 LAB — LIPID PANEL
Cholesterol: 147 mg/dL (ref 0–200)
HDL: 44.3 mg/dL (ref >39.00)
LDL Cholesterol: 84 mg/dL (ref 0–99)
NonHDL: 102.46
Total CHOL/HDL Ratio: 3
Triglycerides: 93 mg/dL (ref 0.0–149.0)
VLDL: 18.6 mg/dL (ref 0.0–40.0)

## 2022-12-28 LAB — HEMOGLOBIN A1C: Hgb A1c MFr Bld: 6 % (ref 4.6–6.5)

## 2022-12-28 LAB — VITAMIN D 25 HYDROXY (VIT D DEFICIENCY, FRACTURES): VITD: 47.23 ng/mL (ref 30.00–100.00)

## 2022-12-28 MED ORDER — GABAPENTIN 100 MG PO CAPS: ORAL_CAPSULE | ORAL | 1 refills | Status: DC

## 2022-12-28 MED ORDER — TRAMADOL HCL 50 MG PO TABS: 50.0000 mg | ORAL_TABLET | Freq: Four times a day (QID) | ORAL | 2 refills | Status: DC | PRN

## 2022-12-28 NOTE — Assessment & Plan Note (Signed)
To [facility] surg[facility] as planned

## 2022-12-28 NOTE — Patient Instructions (Signed)
You had the flu shot today  You are given the work note today  Please have your Shingrix (shingles) shots done at your local pharmacy.  Please continue all other medications as before, and refills have been done if requested for the tramadol and gabapentin  Please have the pharmacy call with any other refills you may need.  Please continue your efforts at being more active, low cholesterol diet, and weight control.  You are otherwise up to date with prevention measures today.  Please keep your appointments with your specialists as you may have planned - Dr Amedeo Plenty on feb 29  Please go to the LAB at the blood drawing area for the tests to be done  You will be contacted by phone if any changes need to be made immediately.  Otherwise, you will receive a letter about your results with an explanation, but please check with MyChart first.  Please remember to sign up for MyChart if you have not done so, as this will be important to you in the future with finding out test results, communicating by private email, and scheduling acute appointments online when needed.  Please make an Appointment to return in 6 months, or sooner if needed
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
## 2022-12-31 ENCOUNTER — Encounter: Payer: Self-pay | Admitting: Internal Medicine

## 2022-12-31 NOTE — Assessment & Plan Note (Signed)
Lab Results  Component Value Date   CREATININE 1.85 (H) 12/28/2022   Stable overall, cont to avoid nephrotoxins

## 2022-12-31 NOTE — Assessment & Plan Note (Signed)
Volume stable, continue currrent med tx

## 2022-12-31 NOTE — Assessment & Plan Note (Signed)
Lab Results  Component Value Date   LDLCALC 84 12/28/2022   Uncontrolled, goal ldl < 70, pt to continue current pravachol 20 mg qd as declines change today, for lower chol diet

## 2022-12-31 NOTE — Assessment & Plan Note (Signed)
Lab Results  Component Value Date   VITAMINB12 404 12/28/2022   Stable, cont oral replacement - b12 1000 mcg qd

## 2022-12-31 NOTE — Assessment & Plan Note (Signed)
Age and sex appropriate education and counseling updated with regular exercise and diet Referrals for preventative services - none needed Immunizations addressed - declines covid booster, for shingrix at pharmacy Smoking counseling  - none needed Evidence for depression or other mood disorder - none significant Most recent labs reviewed. I have personally reviewed and have noted: 1) the patient's medical and social history 2) The patient's current medications and supplements 3) The patient's height, weight, and BMI have been recorded in the chart

## 2022-12-31 NOTE — Assessment & Plan Note (Signed)
Last vitamin D Lab Results  Component Value Date   VD25OH 47.23 12/28/2022   Stable, cont oral replacement

## 2022-12-31 NOTE — Assessment & Plan Note (Addendum)
Uncontrolled as she is forced to work to make ends meet but shoulder and arm pains worsening - for tramadol prn, also trial gabapentin 100 tid, pt to f/u with sports medicine, and also work note given

## 2022-12-31 NOTE — Assessment & Plan Note (Signed)
BP Readings from Last 3 Encounters:  12/28/22 122/78  11/10/22 114/78  09/05/22 128/88   Stable, pt to continue medical treatment tenormin 100 mg qd

## 2022-12-31 NOTE — Assessment & Plan Note (Signed)
Lab Results  Component Value Date   HGBA1C 6.0 12/28/2022   Stable, pt to continue current medical treatment  - diet, wt control

## 2023-01-04 ENCOUNTER — Encounter: Payer: Self-pay | Admitting: Internal Medicine

## 2023-01-12 DIAGNOSIS — G5603 Carpal tunnel syndrome, bilateral upper limbs: Secondary | ICD-10-CM | POA: Diagnosis not present

## 2023-01-12 DIAGNOSIS — M13831 Other specified arthritis, right wrist: Secondary | ICD-10-CM | POA: Diagnosis not present

## 2023-01-12 DIAGNOSIS — M13841 Other specified arthritis, right hand: Secondary | ICD-10-CM | POA: Diagnosis not present

## 2023-01-12 DIAGNOSIS — M25532 Pain in left wrist: Secondary | ICD-10-CM | POA: Diagnosis not present

## 2023-01-12 DIAGNOSIS — M25531 Pain in right wrist: Secondary | ICD-10-CM | POA: Diagnosis not present

## 2023-01-12 DIAGNOSIS — G5602 Carpal tunnel syndrome, left upper limb: Secondary | ICD-10-CM | POA: Diagnosis not present

## 2023-01-12 DIAGNOSIS — E1142 Type 2 diabetes mellitus with diabetic polyneuropathy: Secondary | ICD-10-CM | POA: Insufficient documentation

## 2023-02-22 DIAGNOSIS — E1140 Type 2 diabetes mellitus with diabetic neuropathy, unspecified: Secondary | ICD-10-CM | POA: Diagnosis not present

## 2023-02-22 DIAGNOSIS — M13841 Other specified arthritis, right hand: Secondary | ICD-10-CM | POA: Diagnosis not present

## 2023-02-22 DIAGNOSIS — M13831 Other specified arthritis, right wrist: Secondary | ICD-10-CM | POA: Diagnosis not present

## 2023-02-22 DIAGNOSIS — G5603 Carpal tunnel syndrome, bilateral upper limbs: Secondary | ICD-10-CM | POA: Diagnosis not present

## 2023-02-28 ENCOUNTER — Ambulatory Visit: Payer: Medicare Other | Admitting: Physician Assistant

## 2023-02-28 ENCOUNTER — Encounter: Payer: Self-pay | Admitting: Physician Assistant

## 2023-02-28 VITALS — BP 146/68 | HR 59 | Resp 18 | Ht 63.0 in | Wt 202.0 lb

## 2023-02-28 DIAGNOSIS — Z111 Encounter for screening for respiratory tuberculosis: Secondary | ICD-10-CM

## 2023-02-28 DIAGNOSIS — I10 Essential (primary) hypertension: Secondary | ICD-10-CM

## 2023-02-28 NOTE — Patient Instructions (Signed)
48 to 72 hours for TB reading

## 2023-02-28 NOTE — Progress Notes (Signed)
Pt is not fasting. Pt has taken medications today. TB test done in left forearm.
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
## 2023-03-02 LAB — TB SKIN TEST
Induration: 0 mm
TB Skin Test: NEGATIVE

## 2023-06-14 ENCOUNTER — Encounter (INDEPENDENT_AMBULATORY_CARE_PROVIDER_SITE_OTHER): Payer: Self-pay

## 2023-06-28 ENCOUNTER — Ambulatory Visit: Payer: Medicare Other | Admitting: Internal Medicine

## 2023-06-28 ENCOUNTER — Other Ambulatory Visit: Payer: Self-pay | Admitting: Internal Medicine

## 2023-06-28 VITALS — BP 132/72 | HR 60 | Temp 98.8°F | Ht 63.0 in | Wt 198.4 lb

## 2023-06-28 DIAGNOSIS — G609 Hereditary and idiopathic neuropathy, unspecified: Secondary | ICD-10-CM | POA: Diagnosis not present

## 2023-06-28 DIAGNOSIS — L609 Nail disorder, unspecified: Secondary | ICD-10-CM

## 2023-06-28 DIAGNOSIS — Z01419 Encounter for gynecological examination (general) (routine) without abnormal findings: Secondary | ICD-10-CM

## 2023-06-28 DIAGNOSIS — E7800 Pure hypercholesterolemia, unspecified: Secondary | ICD-10-CM

## 2023-06-28 DIAGNOSIS — I10 Essential (primary) hypertension: Secondary | ICD-10-CM | POA: Diagnosis not present

## 2023-06-28 DIAGNOSIS — E559 Vitamin D deficiency, unspecified: Secondary | ICD-10-CM

## 2023-06-28 DIAGNOSIS — R7302 Impaired glucose tolerance (oral): Secondary | ICD-10-CM | POA: Diagnosis not present

## 2023-06-28 DIAGNOSIS — E039 Hypothyroidism, unspecified: Secondary | ICD-10-CM

## 2023-06-28 DIAGNOSIS — G5602 Carpal tunnel syndrome, left upper limb: Secondary | ICD-10-CM | POA: Diagnosis not present

## 2023-06-28 DIAGNOSIS — R21 Rash and other nonspecific skin eruption: Secondary | ICD-10-CM

## 2023-06-28 LAB — LIPID PANEL
Cholesterol: 172 mg/dL (ref 0–200)
HDL: 42.9 mg/dL (ref >39.00)
LDL Cholesterol: 111 mg/dL — ABNORMAL HIGH (ref 0–99)
NonHDL: 129.39
Total CHOL/HDL Ratio: 4
Triglycerides: 93 mg/dL (ref 0.0–149.0)
VLDL: 18.6 mg/dL (ref 0.0–40.0)

## 2023-06-28 LAB — BASIC METABOLIC PANEL
BUN: 17 mg/dL (ref 6–23)
CO2: 28 mEq/L (ref 19–32)
Calcium: 9.8 mg/dL (ref 8.4–10.5)
Chloride: 105 mEq/L (ref 96–112)
Creatinine, Ser: 1.88 mg/dL — ABNORMAL HIGH (ref 0.40–1.20)
GFR: 24.72 mL/min — ABNORMAL LOW (ref >60.00)
Glucose, Bld: 91 mg/dL (ref 70–99)
Potassium: 4 mEq/L (ref 3.5–5.1)
Sodium: 138 mEq/L (ref 135–145)

## 2023-06-28 LAB — VITAMIN D 25 HYDROXY (VIT D DEFICIENCY, FRACTURES): VITD: 53.1 ng/mL (ref 30.00–100.00)

## 2023-06-28 LAB — HEPATIC FUNCTION PANEL
ALT: 15 U/L (ref 0–35)
AST: 24 U/L (ref 0–37)
Albumin: 4 g/dL (ref 3.5–5.2)
Alkaline Phosphatase: 55 U/L (ref 39–117)
Bilirubin, Direct: 0.1 mg/dL (ref 0.0–0.3)
Total Bilirubin: 0.4 mg/dL (ref 0.2–1.2)
Total Protein: 7.5 g/dL (ref 6.0–8.3)

## 2023-06-28 LAB — HEMOGLOBIN A1C: Hgb A1c MFr Bld: 5.9 % (ref 4.6–6.5)

## 2023-06-28 MED ORDER — GABAPENTIN 100 MG PO CAPS: ORAL_CAPSULE | ORAL | 1 refills | Status: DC

## 2023-06-28 MED ORDER — PRAVASTATIN SODIUM 40 MG PO TABS: 40.0000 mg | ORAL_TABLET | Freq: Every day | ORAL | 3 refills | Status: DC

## 2023-06-28 NOTE — Patient Instructions (Signed)
Please be sure to follow up with your hand surgeon regarding the left and right carpal tunnel  Ok to increase the gabapentin 100 mg to taking 2 in the am, 2 in the afternoon, and 3 at night  Please continue all other medications as before, and refills have been done if requested.  Please have the pharmacy call with any other refills you may need.  Please continue your efforts at being more active, low cholesterol diet, and weight control.  You are otherwise up to date with prevention measures today.  Please keep your appointments with your specialists as you may have planned  You will be contacted regarding the referral for: GYN, podiatry, and dermatology  Please go to the LAB at the blood drawing area for the tests to be done  You will be contacted by phone if any changes need to be made immediately.  Otherwise, you will receive a letter about your results with an explanation, but please check with MyChart first.  Please make an Appointment to return in 6 months, or sooner if needed
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
## 2023-07-01 ENCOUNTER — Encounter: Payer: Self-pay | Admitting: Internal Medicine

## 2023-07-01 NOTE — Assessment & Plan Note (Signed)
Lab Results  Component Value Date   LDLCALC 111 (H) 06/28/2023   Uncontrolled,, pt to continue current statin pravachol 40 every day as delcins change today, for lower chol diet

## 2023-07-01 NOTE — Assessment & Plan Note (Signed)
With persistent pain - pt highly encouraged to fu hand surgury

## 2023-07-01 NOTE — Assessment & Plan Note (Signed)
Worsening subjective- for increased gabapentin to 200 mg bid and 300 mg qhs

## 2023-07-01 NOTE — Assessment & Plan Note (Signed)
Lab Results  Component Value Date   TSH 1.58 12/28/2022   Stable, pt to continue levothyroxine 112 ncg qd

## 2023-07-01 NOTE — Assessment & Plan Note (Signed)
Last vitamin D Lab Results  Component Value Date   VD25OH 53.10 06/28/2023   Stable, cont oral replacement

## 2023-07-01 NOTE — Assessment & Plan Note (Signed)
Lab Results  Component Value Date   HGBA1C 5.9 06/28/2023   Stable, pt to continue current medical treatment  - diet, wt control

## 2023-07-01 NOTE — Assessment & Plan Note (Signed)
BP Readings from Last 3 Encounters:  06/28/23 132/72  02/28/23 (!) 146/68  12/28/22 122/78   Stable, pt to continue medical treatment tenormin 100 qd

## 2023-08-30 DIAGNOSIS — M13831 Other specified arthritis, right wrist: Secondary | ICD-10-CM | POA: Diagnosis not present

## 2023-08-30 DIAGNOSIS — E1140 Type 2 diabetes mellitus with diabetic neuropathy, unspecified: Secondary | ICD-10-CM | POA: Diagnosis not present

## 2023-08-30 DIAGNOSIS — G5603 Carpal tunnel syndrome, bilateral upper limbs: Secondary | ICD-10-CM | POA: Diagnosis not present

## 2023-08-30 DIAGNOSIS — M13841 Other specified arthritis, right hand: Secondary | ICD-10-CM | POA: Diagnosis not present

## 2023-09-12 DIAGNOSIS — E119 Type 2 diabetes mellitus without complications: Secondary | ICD-10-CM | POA: Diagnosis not present

## 2023-09-12 DIAGNOSIS — N183 Chronic kidney disease, stage 3 unspecified: Secondary | ICD-10-CM | POA: Diagnosis not present

## 2023-09-12 DIAGNOSIS — G629 Polyneuropathy, unspecified: Secondary | ICD-10-CM | POA: Diagnosis not present

## 2023-09-12 DIAGNOSIS — E039 Hypothyroidism, unspecified: Secondary | ICD-10-CM | POA: Diagnosis not present

## 2023-09-12 DIAGNOSIS — I10 Essential (primary) hypertension: Secondary | ICD-10-CM | POA: Diagnosis not present

## 2023-09-12 DIAGNOSIS — L989 Disorder of the skin and subcutaneous tissue, unspecified: Secondary | ICD-10-CM | POA: Diagnosis not present

## 2023-09-27 DIAGNOSIS — Z1231 Encounter for screening mammogram for malignant neoplasm of breast: Secondary | ICD-10-CM | POA: Diagnosis not present

## 2023-09-29 ENCOUNTER — Telehealth: Payer: Self-pay | Admitting: Internal Medicine

## 2023-09-29 ENCOUNTER — Other Ambulatory Visit: Payer: Self-pay

## 2023-09-29 MED ORDER — LEVOTHYROXINE SODIUM 112 MCG PO TABS: 112.0000 ug | ORAL_TABLET | Freq: Every day | ORAL | 3 refills | Status: DC

## 2023-09-29 MED ORDER — ATENOLOL 100 MG PO TABS: 100.0000 mg | ORAL_TABLET | Freq: Every day | ORAL | 3 refills | Status: DC

## 2023-09-29 NOTE — Telephone Encounter (Signed)
Refill sent.

## 2023-09-29 NOTE — Telephone Encounter (Signed)
Prescription Request  09/29/2023  LOV: 06/28/2023  What is the name of the medication or equipment? levothyroxine (SYNTHROID) 112 MCG tablet   Have you contacted your pharmacy to request a refill? Yes   Which pharmacy would you like this sent to?   [facility] [phone] - [place], [place] - [address]    Patient notified that their request is being sent to the clinical staff for review and that they should receive a response within 2 business days.   Please advise at Mobile [phone]

## 2023-10-06 ENCOUNTER — Emergency Department (HOSPITAL_COMMUNITY): Payer: Medicare Other

## 2023-10-06 ENCOUNTER — Inpatient Hospital Stay (HOSPITAL_COMMUNITY)
Admission: EM | Admit: 2023-10-06 | Discharge: 2023-10-09 | DRG: 193 | Disposition: A | Discharge status: Home / Self Care | Payer: Medicare Other | Attending: Internal Medicine | Admitting: Internal Medicine

## 2023-10-06 ENCOUNTER — Encounter (HOSPITAL_COMMUNITY): Payer: Self-pay

## 2023-10-06 ENCOUNTER — Other Ambulatory Visit: Payer: Self-pay

## 2023-10-06 DIAGNOSIS — D6959 Other secondary thrombocytopenia: Secondary | ICD-10-CM | POA: Diagnosis not present

## 2023-10-06 DIAGNOSIS — I5032 Chronic diastolic (congestive) heart failure: Secondary | ICD-10-CM | POA: Diagnosis present

## 2023-10-06 DIAGNOSIS — Z961 Presence of intraocular lens: Secondary | ICD-10-CM | POA: Diagnosis present

## 2023-10-06 DIAGNOSIS — N179 Acute kidney failure, unspecified: Secondary | ICD-10-CM | POA: Diagnosis not present

## 2023-10-06 DIAGNOSIS — E7800 Pure hypercholesterolemia, unspecified: Secondary | ICD-10-CM | POA: Diagnosis not present

## 2023-10-06 DIAGNOSIS — D631 Anemia in chronic kidney disease: Secondary | ICD-10-CM | POA: Diagnosis not present

## 2023-10-06 DIAGNOSIS — J9601 Acute respiratory failure with hypoxia: Secondary | ICD-10-CM | POA: Diagnosis not present

## 2023-10-06 DIAGNOSIS — E739 Lactose intolerance, unspecified: Secondary | ICD-10-CM | POA: Diagnosis present

## 2023-10-06 DIAGNOSIS — I5189 Other ill-defined heart diseases: Secondary | ICD-10-CM | POA: Diagnosis not present

## 2023-10-06 DIAGNOSIS — R0602 Shortness of breath: Secondary | ICD-10-CM | POA: Diagnosis not present

## 2023-10-06 DIAGNOSIS — Z833 Family history of diabetes mellitus: Secondary | ICD-10-CM

## 2023-10-06 DIAGNOSIS — I509 Heart failure, unspecified: Secondary | ICD-10-CM | POA: Diagnosis not present

## 2023-10-06 DIAGNOSIS — E785 Hyperlipidemia, unspecified: Secondary | ICD-10-CM | POA: Diagnosis present

## 2023-10-06 DIAGNOSIS — R918 Other nonspecific abnormal finding of lung field: Secondary | ICD-10-CM | POA: Diagnosis not present

## 2023-10-06 DIAGNOSIS — Z6834 Body mass index (BMI) 34.0-34.9, adult: Secondary | ICD-10-CM | POA: Diagnosis not present

## 2023-10-06 DIAGNOSIS — E669 Obesity, unspecified: Secondary | ICD-10-CM | POA: Diagnosis present

## 2023-10-06 DIAGNOSIS — I130 Hypertensive heart and chronic kidney disease with heart failure and stage 1 through stage 4 chronic kidney disease, or unspecified chronic kidney disease: Secondary | ICD-10-CM | POA: Diagnosis present

## 2023-10-06 DIAGNOSIS — R519 Headache, unspecified: Secondary | ICD-10-CM | POA: Diagnosis not present

## 2023-10-06 DIAGNOSIS — Z888 Allergy status to other drugs, medicaments and biological substances status: Secondary | ICD-10-CM

## 2023-10-06 DIAGNOSIS — N184 Chronic kidney disease, stage 4 (severe): Secondary | ICD-10-CM | POA: Diagnosis not present

## 2023-10-06 DIAGNOSIS — D638 Anemia in other chronic diseases classified elsewhere: Secondary | ICD-10-CM | POA: Diagnosis not present

## 2023-10-06 DIAGNOSIS — Z7982 Long term (current) use of aspirin: Secondary | ICD-10-CM

## 2023-10-06 DIAGNOSIS — M810 Age-related osteoporosis without current pathological fracture: Secondary | ICD-10-CM | POA: Diagnosis not present

## 2023-10-06 DIAGNOSIS — D573 Sickle-cell trait: Secondary | ICD-10-CM | POA: Diagnosis present

## 2023-10-06 DIAGNOSIS — E039 Hypothyroidism, unspecified: Secondary | ICD-10-CM | POA: Diagnosis present

## 2023-10-06 DIAGNOSIS — Z96653 Presence of artificial knee joint, bilateral: Secondary | ICD-10-CM | POA: Diagnosis present

## 2023-10-06 DIAGNOSIS — Z90710 Acquired absence of both cervix and uterus: Secondary | ICD-10-CM

## 2023-10-06 DIAGNOSIS — H9190 Unspecified hearing loss, unspecified ear: Secondary | ICD-10-CM | POA: Diagnosis not present

## 2023-10-06 DIAGNOSIS — E1140 Type 2 diabetes mellitus with diabetic neuropathy, unspecified: Secondary | ICD-10-CM | POA: Diagnosis not present

## 2023-10-06 DIAGNOSIS — E1122 Type 2 diabetes mellitus with diabetic chronic kidney disease: Secondary | ICD-10-CM | POA: Diagnosis present

## 2023-10-06 DIAGNOSIS — J45909 Unspecified asthma, uncomplicated: Secondary | ICD-10-CM | POA: Diagnosis not present

## 2023-10-06 DIAGNOSIS — Z8042 Family history of malignant neoplasm of prostate: Secondary | ICD-10-CM

## 2023-10-06 DIAGNOSIS — Z1152 Encounter for screening for COVID-19: Secondary | ICD-10-CM | POA: Diagnosis not present

## 2023-10-06 DIAGNOSIS — I10 Essential (primary) hypertension: Secondary | ICD-10-CM | POA: Diagnosis not present

## 2023-10-06 DIAGNOSIS — E875 Hyperkalemia: Secondary | ICD-10-CM | POA: Diagnosis not present

## 2023-10-06 DIAGNOSIS — N1832 Chronic kidney disease, stage 3b: Secondary | ICD-10-CM | POA: Diagnosis not present

## 2023-10-06 DIAGNOSIS — J189 Pneumonia, unspecified organism: Secondary | ICD-10-CM | POA: Diagnosis present

## 2023-10-06 DIAGNOSIS — N189 Chronic kidney disease, unspecified: Secondary | ICD-10-CM | POA: Diagnosis present

## 2023-10-06 DIAGNOSIS — A419 Sepsis, unspecified organism: Secondary | ICD-10-CM | POA: Diagnosis not present

## 2023-10-06 DIAGNOSIS — Z79890 Hormone replacement therapy (postmenopausal): Secondary | ICD-10-CM

## 2023-10-06 DIAGNOSIS — J209 Acute bronchitis, unspecified: Secondary | ICD-10-CM | POA: Diagnosis present

## 2023-10-06 LAB — COMPREHENSIVE METABOLIC PANEL
ALT: 13 U/L (ref 0–44)
AST: 20 U/L (ref 15–41)
Albumin: 3.2 g/dL — ABNORMAL LOW (ref 3.5–5.0)
Alkaline Phosphatase: 51 U/L (ref 38–126)
Anion gap: 8 (ref 5–15)
BUN: 17 mg/dL (ref 8–23)
CO2: 26 mmol/L (ref 22–32)
Calcium: 9 mg/dL (ref 8.9–10.3)
Chloride: 104 mmol/L (ref 98–111)
Creatinine, Ser: 1.77 mg/dL — ABNORMAL HIGH (ref 0.44–1.00)
GFR, Estimated: 28 mL/min — ABNORMAL LOW (ref >60)
Glucose, Bld: 129 mg/dL — ABNORMAL HIGH (ref 70–99)
Potassium: 4.5 mmol/L (ref 3.5–5.1)
Sodium: 138 mmol/L (ref 135–145)
Total Bilirubin: 0.5 mg/dL (ref <1.2)
Total Protein: 7.7 g/dL (ref 6.5–8.1)

## 2023-10-06 LAB — BRAIN NATRIURETIC PEPTIDE: B Natriuretic Peptide: 69.1 pg/mL (ref 0.0–100.0)

## 2023-10-06 LAB — I-STAT CG4 LACTIC ACID, ED: Lactic Acid, Venous: 1.3 mmol/L (ref 0.5–1.9)

## 2023-10-06 LAB — CBC
HCT: 32.4 % — ABNORMAL LOW (ref 36.0–46.0)
Hemoglobin: 10.6 g/dL — ABNORMAL LOW (ref 12.0–15.0)
MCH: 29.4 pg (ref 26.0–34.0)
MCHC: 32.7 g/dL (ref 30.0–36.0)
MCV: 89.8 fL (ref 80.0–100.0)
Platelets: 147 10*3/uL — ABNORMAL LOW (ref 150–400)
RBC: 3.61 MIL/uL — ABNORMAL LOW (ref 3.87–5.11)
RDW: 13.7 % (ref 11.5–15.5)
WBC: 8.7 10*3/uL (ref 4.0–10.5)
nRBC: 0 % (ref 0.0–0.2)

## 2023-10-06 LAB — RESP PANEL BY RT-PCR (RSV, FLU A&B, COVID)  RVPGX2
Influenza A by PCR: NEGATIVE
Influenza B by PCR: NEGATIVE
Resp Syncytial Virus by PCR: NEGATIVE
SARS Coronavirus 2 by RT PCR: NEGATIVE

## 2023-10-06 LAB — SARS CORONAVIRUS 2 BY RT PCR: SARS Coronavirus 2 by RT PCR: NEGATIVE

## 2023-10-06 LAB — PROCALCITONIN: Procalcitonin: 0.27 ng/mL

## 2023-10-06 MED ORDER — SODIUM CHLORIDE 0.9 % IV BOLUS
500.0000 mL | Freq: Once | INTRAVENOUS | Status: AC
  Administered 2023-10-06: 500 mL via INTRAVENOUS

## 2023-10-06 MED ORDER — FOLIC ACID 1 MG PO TABS
0.5000 mg | ORAL_TABLET | Freq: Every day | ORAL | Status: DC
  Administered 2023-10-06 – 2023-10-09 (×4): 0.5 mg via ORAL
  Filled 2023-10-06 (×4): qty 1

## 2023-10-06 MED ORDER — ACETAMINOPHEN 325 MG PO TABS
650.0000 mg | ORAL_TABLET | Freq: Four times a day (QID) | ORAL | Status: DC | PRN
  Administered 2023-10-07 – 2023-10-08 (×2): 650 mg via ORAL
  Filled 2023-10-06 (×2): qty 2

## 2023-10-06 MED ORDER — OYSTER SHELL CALCIUM/D3 500-5 MG-MCG PO TABS
1.0000 | ORAL_TABLET | Freq: Two times a day (BID) | ORAL | Status: DC
  Administered 2023-10-07 – 2023-10-09 (×5): 1 via ORAL
  Filled 2023-10-06 (×6): qty 1

## 2023-10-06 MED ORDER — GUAIFENESIN ER 600 MG PO TB12
600.0000 mg | ORAL_TABLET | Freq: Two times a day (BID) | ORAL | Status: DC
  Administered 2023-10-06 – 2023-10-09 (×7): 600 mg via ORAL
  Filled 2023-10-06 (×7): qty 1

## 2023-10-06 MED ORDER — PRAVASTATIN SODIUM 40 MG PO TABS
40.0000 mg | ORAL_TABLET | Freq: Every day | ORAL | Status: DC
  Administered 2023-10-06 – 2023-10-09 (×4): 40 mg via ORAL
  Filled 2023-10-06 (×4): qty 1

## 2023-10-06 MED ORDER — BUTALBITAL-APAP-CAFFEINE 50-325-40 MG PO TABS
1.0000 | ORAL_TABLET | Freq: Four times a day (QID) | ORAL | Status: DC | PRN
  Administered 2023-10-07: 1 via ORAL
  Filled 2023-10-06: qty 1

## 2023-10-06 MED ORDER — ACETAMINOPHEN 650 MG RE SUPP: 650.0000 mg | Freq: Four times a day (QID) | RECTAL | Status: DC | PRN

## 2023-10-06 MED ORDER — DIPHENHYDRAMINE HCL 50 MG/ML IJ SOLN
12.5000 mg | Freq: Once | INTRAMUSCULAR | Status: AC
Start: 2023-10-06 — End: 2023-10-06
  Administered 2023-10-06: 12.5 mg via INTRAVENOUS
  Filled 2023-10-06: qty 1

## 2023-10-06 MED ORDER — SODIUM CHLORIDE 0.9 % IV SOLN
1.0000 g | Freq: Once | INTRAVENOUS | Status: AC
  Administered 2023-10-06: 1 g via INTRAVENOUS
  Filled 2023-10-06: qty 10

## 2023-10-06 MED ORDER — FENTANYL CITRATE PF 50 MCG/ML IJ SOSY
25.0000 ug | PREFILLED_SYRINGE | Freq: Once | INTRAMUSCULAR | Status: AC
  Administered 2023-10-06: 25 ug via INTRAVENOUS
  Filled 2023-10-06: qty 1

## 2023-10-06 MED ORDER — ASPIRIN 81 MG PO TBEC
81.0000 mg | DELAYED_RELEASE_TABLET | Freq: Every day | ORAL | Status: DC
  Administered 2023-10-07 – 2023-10-09 (×3): 81 mg via ORAL
  Filled 2023-10-06 (×3): qty 1

## 2023-10-06 MED ORDER — SODIUM CHLORIDE 0.9 % IV SOLN: 500.0000 mg | INTRAVENOUS | Status: DC

## 2023-10-06 MED ORDER — METOCLOPRAMIDE HCL 5 MG/ML IJ SOLN
10.0000 mg | Freq: Once | INTRAMUSCULAR | Status: AC
  Administered 2023-10-06: 10 mg via INTRAVENOUS
  Filled 2023-10-06: qty 2

## 2023-10-06 MED ORDER — GABAPENTIN 100 MG PO CAPS
200.0000 mg | ORAL_CAPSULE | ORAL | Status: DC
  Administered 2023-10-07 – 2023-10-09 (×5): 200 mg via ORAL
  Filled 2023-10-06 (×5): qty 2

## 2023-10-06 MED ORDER — VITAMIN C 500 MG PO TABS
250.0000 mg | ORAL_TABLET | Freq: Every day | ORAL | Status: DC
  Administered 2023-10-06 – 2023-10-09 (×4): 250 mg via ORAL
  Filled 2023-10-06 (×4): qty 1

## 2023-10-06 MED ORDER — ONDANSETRON HCL 4 MG PO TABS: 4.0000 mg | ORAL_TABLET | Freq: Four times a day (QID) | ORAL | Status: DC | PRN

## 2023-10-06 MED ORDER — SODIUM CHLORIDE 0.9 % IV SOLN
2.0000 g | INTRAVENOUS | Status: DC
  Administered 2023-10-07 – 2023-10-09 (×3): 2 g via INTRAVENOUS
  Filled 2023-10-06 (×3): qty 20

## 2023-10-06 MED ORDER — SODIUM CHLORIDE 0.9 % IV SOLN
500.0000 mg | Freq: Once | INTRAVENOUS | Status: AC
  Administered 2023-10-06: 500 mg via INTRAVENOUS
  Filled 2023-10-06: qty 5

## 2023-10-06 MED ORDER — ONDANSETRON HCL 4 MG/2ML IJ SOLN: 4.0000 mg | Freq: Four times a day (QID) | INTRAMUSCULAR | Status: DC | PRN

## 2023-10-06 MED ORDER — SODIUM CHLORIDE 0.9% FLUSH
3.0000 mL | Freq: Two times a day (BID) | INTRAVENOUS | Status: DC
  Administered 2023-10-06 – 2023-10-09 (×6): 3 mL via INTRAVENOUS

## 2023-10-06 MED ORDER — LORATADINE 10 MG PO TABS
10.0000 mg | ORAL_TABLET | Freq: Every day | ORAL | Status: DC
  Administered 2023-10-06 – 2023-10-09 (×4): 10 mg via ORAL
  Filled 2023-10-06 (×4): qty 1

## 2023-10-06 MED ORDER — GABAPENTIN 100 MG PO CAPS: 200.0000 mg | ORAL_CAPSULE | ORAL | Status: DC

## 2023-10-06 MED ORDER — LEVOTHYROXINE SODIUM 112 MCG PO TABS
112.0000 ug | ORAL_TABLET | Freq: Every day | ORAL | Status: DC
  Administered 2023-10-07 – 2023-10-09 (×3): 112 ug via ORAL
  Filled 2023-10-06 (×3): qty 1

## 2023-10-06 MED ORDER — ALBUTEROL SULFATE (2.5 MG/3ML) 0.083% IN NEBU: 2.5000 mg | INHALATION_SOLUTION | Freq: Four times a day (QID) | RESPIRATORY_TRACT | Status: DC | PRN

## 2023-10-06 MED ORDER — ACETAMINOPHEN 500 MG PO TABS
1000.0000 mg | ORAL_TABLET | Freq: Once | ORAL | Status: AC
  Administered 2023-10-06: 1000 mg via ORAL
  Filled 2023-10-06: qty 2

## 2023-10-06 MED ORDER — GABAPENTIN 300 MG PO CAPS
300.0000 mg | ORAL_CAPSULE | Freq: Every day | ORAL | Status: DC
  Administered 2023-10-06 – 2023-10-08 (×3): 300 mg via ORAL
  Filled 2023-10-06 (×3): qty 1

## 2023-10-06 MED ORDER — MORPHINE SULFATE (PF) 4 MG/ML IV SOLN
4.0000 mg | Freq: Once | INTRAVENOUS | Status: AC
  Administered 2023-10-06: 4 mg via INTRAVENOUS
  Filled 2023-10-06: qty 1

## 2023-10-06 MED ORDER — HEPARIN SODIUM (PORCINE) 5000 UNIT/ML IJ SOLN
5000.0000 [IU] | Freq: Three times a day (TID) | INTRAMUSCULAR | Status: DC
  Administered 2023-10-06 – 2023-10-09 (×9): 5000 [IU] via SUBCUTANEOUS
  Filled 2023-10-06 (×7): qty 1
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
## 2023-10-06 NOTE — Plan of Care (Signed)

## 2023-10-06 NOTE — ED Triage Notes (Signed)
Pt reports being sick for a few days. Pt reports headache. Pt has been coughing and when she coughs she has stress incontinence.  Pt denies nausea and vomiting.

## 2023-10-06 NOTE — ED Provider Notes (Signed)
[place] EMERGENCY DEPARTMENT AT [facility] Provider Note   CSN: [id number] Arrival date & time: 10/06/23  6606     History  Chief Complaint  Patient presents with   Cough    [name] is a 82 y.o. female.  Pt is a 82 yo female with pmhx significant for hypothyroidism, hld, anemia, hearing loss, pud, neuropathy, dm2, migraines, ckd, chf, and sleep apnea.  Pt said she has been sick for 3-4 days.  She's had a productive cough and a headache.  She denies fevers or vomiting.  She did take tylenol pta.  Pt's grandson lives with her and is here with her today.       Home Medications Prior to Admission medications   Medication Sig Start Date End Date Taking? Authorizing Provider  acetaminophen (TYLENOL) 500 MG tablet Take 1,000 mg by mouth every 6 (six) hours as needed.    [provider]  Ascorbic Acid (VITAMIN C) 100 MG tablet Take 100 mg by mouth daily.    [provider]  aspirin 81 MG EC tablet Take 1 tablet (81 mg total) by mouth daily. 08/19/19   Corwin Levins, MD  atenolol (TENORMIN) 100 MG tablet Take 1 tablet (100 mg total) by mouth daily. 09/29/23   Corwin Levins, MD  calcium-vitamin D (OSCAL WITH D) 500-200 MG-UNIT per tablet Take 1 tablet by mouth 2 (two) times daily.    [provider]  cholecalciferol (VITAMIN D) 1000 units tablet Take 2,000 Units by mouth daily.     [provider]  [facility] Liver Oil CAPS Take 1 capsule by mouth daily.    [provider]  cyanocobalamin (,VITAMIN B-12,) 1000 MCG/ML injection Inject 1,000 mcg into the muscle once.    [provider]  fexofenadine (ALLEGRA) 180 MG tablet Take 1 tablet (180 mg total) by mouth daily. 06/21/19 06/20/20  Corwin Levins, MD  folic acid (FOLVITE) 0.5 MG tablet Take 0.5 mg by mouth daily.    [provider]  furosemide (LASIX) 40 MG tablet 1 tab by mouth per day as needed for swelling 06/18/18   Corwin Levins, MD  gabapentin (NEURONTIN) 100 MG  capsule 2 tab by mouth in the am, 2 tab about 3 pm, and 3 tab in the PM 06/28/23   Corwin Levins, MD  levothyroxine (SYNTHROID) 112 MCG tablet Take 1 tablet (112 mcg total) by mouth daily before breakfast. 09/29/23   Corwin Levins, MD  Multiple Vitamin (MULTIVITAMIN) tablet Take 1 tablet by mouth daily.    [provider]  potassium chloride (K-DUR) 10 MEQ tablet 1 tab by mouth daily with the lasix only as needed 06/18/18   Corwin Levins, MD  pravastatin (PRAVACHOL) 40 MG tablet Take 1 tablet (40 mg total) by mouth daily. 06/28/23   Corwin Levins, MD  Prenatal Vit-Fe Fumarate-FA (MULTIVITAMIN-PRENATAL) 27-0.8 MG TABS tablet Take 1 tablet by mouth daily at 12 noon.    [provider]  pyridOXINE (VITAMIN B-6) 100 MG tablet Take 100 mg by mouth daily.    [provider]  traMADol (ULTRAM) 50 MG tablet Take 1 tablet (50 mg total) by mouth every 6 (six) hours as needed. 12/28/22   Corwin Levins, MD  vitamin E 45 MG (100 UNITS) capsule Take by mouth daily.    [provider]      Allergies    Naproxen, Flexeril [cyclobenzaprine], and Lactose intolerance (gi)    Review of  Systems   Review of Systems  Respiratory:  Positive for cough.   Neurological:  Positive for headaches.  All other systems reviewed and are negative.   Physical Exam Updated Vital Signs BP (!) 103/52 (BP Location: Left Arm)   Pulse 72   Temp 98.6 F (37 C) (Oral)   Resp (!) 26   Ht 5\' 3"  (1.6 m)   Wt 89.4 kg   SpO2 96%   BMI 34.90 kg/m  Physical Exam Vitals and nursing note reviewed.  Constitutional:      Appearance: Normal appearance.  HENT:     Head: Normocephalic and atraumatic.     Right Ear: External ear normal.     Left Ear: External ear normal.     Nose: Nose normal.     Mouth/Throat:     Mouth: Mucous membranes are moist.     Pharynx: Oropharynx is clear.  Eyes:     Extraocular Movements: Extraocular movements intact.     Conjunctiva/sclera: Conjunctivae normal.      Pupils: Pupils are equal, round, and reactive to light.  Cardiovascular:     Rate and Rhythm: Normal rate and regular rhythm.     Pulses: Normal pulses.     Heart sounds: Normal heart sounds.  Pulmonary:     Breath sounds: Rhonchi present.  Abdominal:     General: Abdomen is flat. Bowel sounds are normal.     Palpations: Abdomen is soft.  Musculoskeletal:        General: Normal range of motion.     Cervical back: Normal range of motion and neck supple.  Skin:    General: Skin is warm.     Capillary Refill: Capillary refill takes less than 2 seconds.  Neurological:     General: No focal deficit present.     Mental Status: She is alert and oriented to person, place, and time.  Psychiatric:        Mood and Affect: Mood normal.        Behavior: Behavior normal.        Thought Content: Thought content normal.        Judgment: Judgment normal.     ED Results / Procedures / Treatments   Labs (all labs ordered are listed, but only abnormal results are displayed) Labs Reviewed  CBC - Abnormal; Notable for the following components:      Result Value   RBC 3.61 (*)    Hemoglobin 10.6 (*)    HCT 32.4 (*)    Platelets 147 (*)    All other components within normal limits  COMPREHENSIVE METABOLIC PANEL - Abnormal; Notable for the following components:   Glucose, Bld 129 (*)    Creatinine, Ser 1.77 (*)    Albumin 3.2 (*)    GFR, Estimated 28 (*)    All other components within normal limits  SARS CORONAVIRUS 2 BY RT PCR  RESP PANEL BY RT-PCR (RSV, FLU A&B, COVID)  RVPGX2  CULTURE, BLOOD (ROUTINE X 2)  CULTURE, BLOOD (ROUTINE X 2)  I-STAT CG4 LACTIC ACID, ED    EKG EKG Interpretation Date/Time:  Friday October 06 2023 07:06:10 EST Ventricular Rate:  75 PR Interval:  150 QRS Duration:  76 QT Interval:  404 QTC Calculation: 451 R Axis:   -29  Text Interpretation: Normal sinus rhythm Anterior infarct , age undetermined Abnormal ECG When compared with ECG of 28-Jun-2021 09:27,  PREVIOUS ECG IS PRESENT No significant change since last tracing Confirmed by Jacalyn Lefevre [phone])  on 10/06/2023 7:11:22 AM  Radiology CT Head Wo Contrast  Result Date: 10/06/2023 CLINICAL DATA:  Headache, new onset (Age >= 51y). EXAM: CT HEAD WITHOUT CONTRAST TECHNIQUE: Contiguous axial images were obtained from the base of the skull through the vertex without intravenous contrast. RADIATION DOSE REDUCTION: This exam was performed according to the departmental dose-optimization program which includes automated exposure control, adjustment of the mA and/or kV according to patient size and/or use of iterative reconstruction technique. COMPARISON:  Head MRI 03/06/2022 FINDINGS: Brain: There is no evidence of an acute infarct, intracranial hemorrhage, mass, midline shift, or extra-axial fluid collection. A punctate hyperdense focus involving cortex in the right parieto-occipital region corresponds to an area of prominent susceptibility on the prior MRI and likely reflects calcification. The ventricles are normal in size. Vascular: Mild atherosclerotic calcification involving the carotid siphons. No hyperdense vessel. Skull: No acute fracture or suspicious osseous lesion. Sinuses/Orbits: Paranasal sinuses and mastoid air cells are clear. Bilateral cataract extraction. Other: None. IMPRESSION: No evidence of acute intracranial abnormality. Electronically Signed   By: Sebastian Ache M.D.   On: 10/06/2023 11:56   DG Chest 1 View  Result Date: 10/06/2023 CLINICAL DATA:  Shortness of breath. History of asthma and congestive heart failure. EXAM: CHEST  1 VIEW COMPARISON:  06/28/2021 FINDINGS: Heart size appears normal. No pleural effusion. Central airway thickening with peribronchial cuffing noted in bilateral hilar regions. Asymmetric retrocardiac opacification noted in the left base. Right lung clear. IMPRESSION: 1. Central airway thickening with peribronchial cuffing compatible with bronchitis. 2. Asymmetric  retrocardiac opacification in the left base may reflect atelectasis or pneumonia. Electronically Signed   By: Signa Kell M.D.   On: 10/06/2023 07:24    Procedures Procedures    Medications Ordered in ED Medications  sodium chloride 0.9 % bolus 500 mL (has no administration in time range)  cefTRIAXone (ROCEPHIN) 1 g in sodium chloride 0.9 % 100 mL IVPB (has no administration in time range)  sodium chloride 0.9 % bolus 500 mL (0 mLs Intravenous Stopped 10/06/23 0921)  cefTRIAXone (ROCEPHIN) 1 g in sodium chloride 0.9 % 100 mL IVPB (0 g Intravenous Stopped 10/06/23 0855)  azithromycin (ZITHROMAX) 500 mg in sodium chloride 0.9 % 250 mL IVPB (0 mg Intravenous Stopped 10/06/23 1026)  metoCLOPramide (REGLAN) injection 10 mg (10 mg Intravenous Given 10/06/23 0821)  diphenhydrAMINE (BENADRYL) injection 12.5 mg (12.5 mg Intravenous Given 10/06/23 0819)  fentaNYL (SUBLIMAZE) injection 25 mcg (25 mcg Intravenous Given 10/06/23 1191)  acetaminophen (TYLENOL) tablet 1,000 mg (1,000 mg Oral Given 10/06/23 1159)  morphine (PF) 4 MG/ML injection 4 mg (4 mg Intravenous Given 10/06/23 1157)    ED Course/ Medical Decision Making/ A&P                                 Medical Decision Making Amount and/or Complexity of Data Reviewed Labs: ordered. Radiology: ordered.  Risk OTC drugs. Prescription drug management. Decision regarding hospitalization.   This patient presents to the ED for concern of cough, this involves an extensive number of treatment options, and is a complaint that carries with it a high risk of complications and morbidity.  The differential diagnosis includes pna, covid, bronchitis   Co morbidities that complicate the patient evaluation   hypothyroidism, hld, anemia, hearing loss, pud, neuropathy, dm2, migraines, ckd, chf, and sleep apnea   Additional history obtained:  Additional history obtained from epic chart review External records from outside source  obtained and  reviewed including grandson   Lab Tests:  I Ordered, and personally interpreted labs.  The pertinent results include:  cbc with hgb 10.6 (stable), covid/flu/rsv neg, cmp nl other than cr elevated at 1.77 (stable), lactic nl at 1.3   Imaging Studies ordered:  I ordered imaging studies including cxr and CT I independently visualized and interpreted imaging which showed  CXR:  Central airway thickening with peribronchial cuffing compatible  with bronchitis.  2. Asymmetric retrocardiac opacification in the left base may  reflect atelectasis or pneumonia.  CT head: No evidence of acute intracranial abnormality.  I agree with the radiologist interpretation   Cardiac Monitoring:  The patient was maintained on a cardiac monitor.  I personally viewed and interpreted the cardiac monitored which showed an underlying rhythm of: nsr   Medicines ordered and prescription drug management:  I ordered medication including reglan, benadryl, ivfs, fentanyl  for sx  Reevaluation of the patient after these medicines showed that the patient improved I have reviewed the patients home medicines and have made adjustments as needed   Test Considered:  ct   Critical Interventions:  abx   Consultations Obtained:  I requested consultation with the hospitalist (Dr. Katrinka Blazing),  and discussed lab and imaging findings as well as pertinent plan - he will admit   Problem List / ED Course:  CAP:  rocephin and zithromax given in the ED.  Pt's O2 sat dropped to 88% with ambulation.  Pt also became very sob.  She is put back in bed and put on 2L.  Pt will need admission for IV abx.     Reevaluation:  After the interventions noted above, I reevaluated the patient and found that they have :improved   Social Determinants of Health:  Lives at home   Dispostion:  After consideration of the diagnostic results and the patients response to treatment, I feel that the patent would benefit from  admission.          Final Clinical Impression(s) / ED Diagnoses Final diagnoses:  Community acquired pneumonia, unspecified laterality    Rx / DC Orders ED Discharge Orders     None         Jacalyn Lefevre, MD 10/06/23 1430

## 2023-10-06 NOTE — ED Notes (Signed)
Phlebotomy to come draw repeat I-stat lactic acid.
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
## 2023-10-06 NOTE — ED Notes (Addendum)
Patient de-satted to 88% while ambulating with increased shortness of breath. Patient remains short of breath upon return to bed but oxygen saturation improved to 95% on room air. ED attending physician, Dr. Particia Nearing, made aware via face-to-face conversation. Per Dr. Particia Nearing, place patient on 2L [place] for comfort.

## 2023-10-07 DIAGNOSIS — J189 Pneumonia, unspecified organism: Secondary | ICD-10-CM | POA: Diagnosis not present

## 2023-10-07 DIAGNOSIS — A419 Sepsis, unspecified organism: Secondary | ICD-10-CM | POA: Diagnosis not present

## 2023-10-07 LAB — BASIC METABOLIC PANEL
Anion gap: 7 (ref 5–15)
BUN: 24 mg/dL — ABNORMAL HIGH (ref 8–23)
CO2: 23 mmol/L (ref 22–32)
Calcium: 8.4 mg/dL — ABNORMAL LOW (ref 8.9–10.3)
Chloride: 106 mmol/L (ref 98–111)
Creatinine, Ser: 2.29 mg/dL — ABNORMAL HIGH (ref 0.44–1.00)
GFR, Estimated: 21 mL/min — ABNORMAL LOW (ref >60)
Glucose, Bld: 120 mg/dL — ABNORMAL HIGH (ref 70–99)
Potassium: 4.7 mmol/L (ref 3.5–5.1)
Sodium: 136 mmol/L (ref 135–145)

## 2023-10-07 LAB — CBC
HCT: 26.5 % — ABNORMAL LOW (ref 36.0–46.0)
Hemoglobin: 8.7 g/dL — ABNORMAL LOW (ref 12.0–15.0)
MCH: 29.2 pg (ref 26.0–34.0)
MCHC: 32.8 g/dL (ref 30.0–36.0)
MCV: 88.9 fL (ref 80.0–100.0)
Platelets: 122 10*3/uL — ABNORMAL LOW (ref 150–400)
RBC: 2.98 MIL/uL — ABNORMAL LOW (ref 3.87–5.11)
RDW: 13.5 % (ref 11.5–15.5)
WBC: 6.1 10*3/uL (ref 4.0–10.5)
nRBC: 0 % (ref 0.0–0.2)

## 2023-10-07 LAB — RESPIRATORY PANEL BY PCR

## 2023-10-07 LAB — BRAIN NATRIURETIC PEPTIDE: B Natriuretic Peptide: 84.8 pg/mL (ref 0.0–100.0)

## 2023-10-07 MED ORDER — SENNOSIDES-DOCUSATE SODIUM 8.6-50 MG PO TABS: 1.0000 | ORAL_TABLET | Freq: Every evening | ORAL | Status: DC | PRN

## 2023-10-07 MED ORDER — METOPROLOL TARTRATE 5 MG/5ML IV SOLN: 5.0000 mg | INTRAVENOUS | Status: DC | PRN

## 2023-10-07 MED ORDER — AZITHROMYCIN 500 MG PO TABS
500.0000 mg | ORAL_TABLET | Freq: Every day | ORAL | Status: AC
  Administered 2023-10-07 – 2023-10-08 (×2): 500 mg via ORAL
  Filled 2023-10-07 (×2): qty 1

## 2023-10-07 MED ORDER — IPRATROPIUM-ALBUTEROL 0.5-2.5 (3) MG/3ML IN SOLN: 3.0000 mL | RESPIRATORY_TRACT | Status: DC | PRN

## 2023-10-07 MED ORDER — ARFORMOTEROL TARTRATE 15 MCG/2ML IN NEBU
15.0000 ug | INHALATION_SOLUTION | Freq: Two times a day (BID) | RESPIRATORY_TRACT | Status: DC
  Administered 2023-10-07 – 2023-10-09 (×5): 15 ug via RESPIRATORY_TRACT
  Filled 2023-10-07 (×5): qty 2

## 2023-10-07 MED ORDER — SODIUM CHLORIDE 0.9 % IV SOLN: INTRAVENOUS | Status: AC

## 2023-10-07 MED ORDER — HYDROCORTISONE 1 % EX CREA
TOPICAL_CREAM | Freq: Two times a day (BID) | CUTANEOUS | Status: DC
  Filled 2023-10-07: qty 28

## 2023-10-07 MED ORDER — REVEFENACIN 175 MCG/3ML IN SOLN
175.0000 ug | Freq: Every day | RESPIRATORY_TRACT | Status: DC
  Administered 2023-10-07 – 2023-10-09 (×3): 175 ug via RESPIRATORY_TRACT
  Filled 2023-10-07 (×3): qty 3

## 2023-10-07 MED ORDER — HYDRALAZINE HCL 20 MG/ML IJ SOLN: 10.0000 mg | INTRAMUSCULAR | Status: DC | PRN

## 2023-10-07 NOTE — Plan of Care (Signed)

## 2023-10-07 NOTE — Progress Notes (Signed)
PROGRESS NOTE    [name]  JYN:[id number] DOB: 20-Sep-1941 DOA: 10/06/2023 PCP: Corwin Levins, MD     Brief Narrative:  82 year old with history of HTN, HLD, diastolic CHF, hypothyroidism, OA, anemia comes to the hospital complains of cough and congestion started 3 days prior to admission.  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  Upon admission noted to have slight hypoxia, chest x-ray concerning for pneumonia/bronchitis.  Flu, COVID, RSV negative.  CT head is also negative.  Started on empiric Rocephin and azithromycin.   Assessment & Plan:  Principal Problem:   Sepsis due to pneumonia [facility]) Active Problems:   Acute respiratory failure with hypoxia (HCC)   Essential hypertension   Diastolic dysfunction   CKD (chronic kidney disease)   Hypothyroidism   Hyperlipidemia   Anemia of chronic disease   Acute respiratory failure with hypoxia Sepsis secondary to community-acquired pneumonia Sepsis physiology slowly improving.  Concerns of acute bronchitis versus community-acquired pneumonia.  COVID, RSV, flu is negative.  Respiratory panel negative.  Procalcitonin 0.27.  Continue Rocephin/azithromycin for now.  Incentive spirometer flutter valve.  Bronchodilators.  Supportive care.   Essential hypertension On IV as needed   Chronic congestive heart failure with preserved EF, 65%. Overall appears to be euvolemic.   AKI chronic kidney disease stage IV Admission creatinine 1.77, today 2.29.  Will give IV fluids   Hypothyroidism Continue home Synthroid.   Hyperlipidemia -Continue pravastatin   Anemia of chronic disease Hemoglobin is stable around 9.0.   Thrombocytopenia Likely in the setting of acute infection.  Platelets around 120.  Continue to monitor   Obesity BMI 34.9 kg/m  Peripheral neuropathy - Gabapentin   DVT prophylaxis: heparin injection 5,000 Units Start: 10/06/23 1445 Code Status: Full code Family Communication:   Status is:  Inpatient Continue hospital stay.  Ongoing management for acute bronchitis and acute kidney injury    Subjective: Breathing is slightly better but still having quite a bit of cough and congestion   Examination:  General exam: Appears calm and comfortable  Respiratory system: Bowel lateral diffuse rhonchi with mild expiratory wheeze Cardiovascular system: S1 & S2 heard, RRR. No JVD, murmurs, rubs, gallops or clicks. No pedal edema. Gastrointestinal system: Abdomen is nondistended, soft and nontender. No organomegaly or masses felt. Normal bowel sounds heard. Central nervous system: Alert and oriented. No focal neurological deficits. Extremities: Symmetric 5 x 5 power. Skin: No rashes, lesions or ulcers Psychiatry: Judgement and insight appear normal. Mood & affect appropriate.                Diet Orders (From admission, onward)     Start     Ordered   10/06/23 1431  Diet Heart Room service appropriate? Yes; Fluid consistency: Thin  Diet effective now       Question Answer Comment  Room service appropriate? Yes   Fluid consistency: Thin      10/06/23 1432            Objective: Vitals:   10/07/23 0300 10/07/23 0500 10/07/23 0730 10/07/23 1054  BP: (!) 106/59  (!) 117/53   Pulse: 64  70   Resp:   18   Temp: 99.6 F (37.6 C)  98 F (36.7 C)   TempSrc: Oral     SpO2: 99%  98% 99%  Weight:  91.6 kg    Height:        Intake/Output Summary (Last 24 hours) at 10/07/2023 1126 Last data filed at 10/06/2023 1649  Gross per 24 hour  Intake 98.81 ml  Output --  Net 98.81 ml   Filed Weights   10/06/23 0626 10/07/23 0500  Weight: 89.4 kg 91.6 kg    Scheduled Meds:  arformoterol  15 mcg Nebulization BID   ascorbic acid  250 mg Oral Daily   aspirin EC  81 mg Oral Daily   azithromycin  500 mg Oral Daily   calcium-vitamin D  1 tablet Oral BID   folic acid  0.5 mg Oral Daily   gabapentin  200 mg Oral 2 times per day   gabapentin  300 mg Oral QHS    guaiFENesin  600 mg Oral BID   heparin  5,000 Units Subcutaneous Q8H   levothyroxine  112 mcg Oral QAC breakfast   loratadine  10 mg Oral Daily   pravastatin  40 mg Oral Daily   revefenacin  175 mcg Nebulization Daily   sodium chloride flush  3 mL Intravenous Q12H   Continuous Infusions:  sodium chloride 75 mL/hr at 10/07/23 1051   cefTRIAXone (ROCEPHIN)  IV 2 g (10/07/23 1053)    Nutritional status     Body mass index is 35.77 kg/m.  Data Reviewed:   CBC: Recent Labs  Lab 10/06/23 0700 10/07/23 0436  WBC 8.7 6.1  HGB 10.6* 8.7*  HCT 32.4* 26.5*  MCV 89.8 88.9  PLT 147* 122*   Basic Metabolic Panel: Recent Labs  Lab 10/06/23 0803 10/07/23 0436  NA 138 136  K 4.5 4.7  CL 104 106  CO2 26 23  GLUCOSE 129* 120*  BUN 17 24*  CREATININE 1.77* 2.29*  CALCIUM 9.0 8.4*   GFR: Estimated Creatinine Clearance: 20.4 mL/min (A) (by C-G formula based on SCr of 2.29 mg/dL (H)). Liver Function Tests: Recent Labs  Lab 10/06/23 0803  AST 20  ALT 13  ALKPHOS 51  BILITOT 0.5  PROT 7.7  ALBUMIN 3.2*   No results for input(s): "LIPASE", "AMYLASE" in the last 168 hours. No results for input(s): "AMMONIA" in the last 168 hours. Coagulation Profile: No results for input(s): "INR", "PROTIME" in the last 168 hours. Cardiac Enzymes: No results for input(s): "CKTOTAL", "CKMB", "CKMBINDEX", "TROPONINI" in the last 168 hours. BNP (last 3 results) No results for input(s): "PROBNP" in the last 8760 hours. HbA1C: No results for input(s): "HGBA1C" in the last 72 hours. CBG: No results for input(s): "GLUCAP" in the last 168 hours. Lipid Profile: No results for input(s): "CHOL", "HDL", "LDLCALC", "TRIG", "CHOLHDL", "LDLDIRECT" in the last 72 hours. Thyroid Function Tests: No results for input(s): "TSH", "T4TOTAL", "FREET4", "T3FREE", "THYROIDAB" in the last 72 hours. Anemia Panel: No results for input(s): "VITAMINB12", "FOLATE", "FERRITIN", "TIBC", "IRON", "RETICCTPCT" in the  last 72 hours. Sepsis Labs: Recent Labs  Lab 10/06/23 0803 10/06/23 0829  PROCALCITON 0.27  --   LATICACIDVEN  --  1.3    Recent Results (from the past 240 hour(s))  SARS Coronavirus 2 by RT PCR (hospital order, performed in [facility] hospital lab) *cepheid single result test* Anterior Nasal Swab     Status: None   Collection Time: 10/06/23  6:40 AM   Specimen: Anterior Nasal Swab  Result Value Ref Range Status   SARS Coronavirus 2 by RT PCR NEGATIVE NEGATIVE Final    Comment: Performed at [facility], [address], [place], [place] [address]  Culture, blood (routine x 2)     Status: None (Preliminary result)   Collection Time: 10/06/23  8:03 AM   Specimen:  BLOOD  Result Value Ref Range Status   Specimen Description BLOOD SITE NOT SPECIFIED  Final   Special Requests   Final    BOTTLES DRAWN AEROBIC AND ANAEROBIC Blood Culture adequate volume   Culture   Final    NO GROWTH 1 DAY Performed at [facility], [address], [place], [place] [address]    Report Status PENDING  Incomplete  Culture, blood (routine x 2)     Status: None (Preliminary result)   Collection Time: 10/06/23  8:14 AM   Specimen: BLOOD  Result Value Ref Range Status   Specimen Description BLOOD SITE NOT SPECIFIED  Final   Special Requests AEROBIC BOTTLE ONLY Blood Culture adequate volume  Final   Culture   Final    NO GROWTH 1 DAY Performed at [facility], [address], [place], [place] [address]    Report Status PENDING  Incomplete  Resp panel by RT-PCR (RSV, Flu A&B, Covid) Anterior Nasal Swab     Status: None   Collection Time: 10/06/23 11:20 AM   Specimen: Anterior Nasal Swab  Result Value Ref Range Status   SARS Coronavirus 2 by RT PCR NEGATIVE NEGATIVE Final   Influenza A by PCR NEGATIVE NEGATIVE Final   Influenza B by PCR NEGATIVE NEGATIVE Final    Comment: (NOTE) The Xpert Xpress SARS-CoV-2/FLU/RSV plus assay is intended as an aid in the diagnosis of influenza from  Nasopharyngeal swab specimens and should not be used as a sole basis for treatment. Nasal washings and aspirates are unacceptable for Xpert Xpress SARS-CoV-2/FLU/RSV testing.  Fact Sheet for Patients: BloggerCourse.com  Fact Sheet for Healthcare Providers: SeriousBroker.it  This test is not yet approved or cleared by the [place] FDA and has been authorized for detection and/or diagnosis of SARS-CoV-2 by FDA under an Emergency Use Authorization (EUA). This EUA will remain in effect (meaning this test can be used) for the duration of the COVID-19 declaration under Section 564(b)(1) of the Act, 21 U.S.C. section 360bbb-3(b)(1), unless the authorization is terminated or revoked.     Resp Syncytial Virus by PCR NEGATIVE NEGATIVE Final    Comment: (NOTE) Fact Sheet for Patients: BloggerCourse.com  Fact Sheet for Healthcare Providers: SeriousBroker.it  This test is not yet approved or cleared by the [place] FDA and has been authorized for detection and/or diagnosis of SARS-CoV-2 by FDA under an Emergency Use Authorization (EUA). This EUA will remain in effect (meaning this test can be used) for the duration of the COVID-19 declaration under Section 564(b)(1) of the Act, 21 U.S.C. section 360bbb-3(b)(1), unless the authorization is terminated or revoked.  Performed at [facility], [address], [place], [place] [address]   Respiratory (~20 pathogens) panel by PCR     Status: None   Collection Time: 10/07/23  7:51 AM   Specimen: Nasopharyngeal Swab; Respiratory  Result Value Ref Range Status   Adenovirus NOT DETECTED NOT DETECTED Final   Coronavirus 229E NOT DETECTED NOT DETECTED Final    Comment: (NOTE) The Coronavirus on the Respiratory Panel, DOES NOT test for the novel  Coronavirus (2019 nCoV)    Coronavirus HKU1 NOT DETECTED NOT DETECTED Final    Coronavirus NL63 NOT DETECTED NOT DETECTED Final   Coronavirus OC43 NOT DETECTED NOT DETECTED Final   Metapneumovirus NOT DETECTED NOT DETECTED Final   Rhinovirus / Enterovirus NOT DETECTED NOT DETECTED Final   Influenza A NOT DETECTED NOT DETECTED Final   Influenza B NOT DETECTED NOT  DETECTED Final   Parainfluenza Virus 1 NOT DETECTED NOT DETECTED Final   Parainfluenza Virus 2 NOT DETECTED NOT DETECTED Final   Parainfluenza Virus 3 NOT DETECTED NOT DETECTED Final   Parainfluenza Virus 4 NOT DETECTED NOT DETECTED Final   Respiratory Syncytial Virus NOT DETECTED NOT DETECTED Final   Bordetella pertussis NOT DETECTED NOT DETECTED Final   Bordetella Parapertussis NOT DETECTED NOT DETECTED Final   Chlamydophila pneumoniae NOT DETECTED NOT DETECTED Final   Mycoplasma pneumoniae NOT DETECTED NOT DETECTED Final    Comment: Performed at [facility], [address], [place], [place] [address]         Radiology Studies: CT Head Wo Contrast  Result Date: 10/06/2023 CLINICAL DATA:  Headache, new onset (Age >= 51y). EXAM: CT HEAD WITHOUT CONTRAST TECHNIQUE: Contiguous axial images were obtained from the base of the skull through the vertex without intravenous contrast. RADIATION DOSE REDUCTION: This exam was performed according to the departmental dose-optimization program which includes automated exposure control, adjustment of the mA and/or kV according to patient size and/or use of iterative reconstruction technique. COMPARISON:  Head MRI 03/06/2022 FINDINGS: Brain: There is no evidence of an acute infarct, intracranial hemorrhage, mass, midline shift, or extra-axial fluid collection. A punctate hyperdense focus involving cortex in the right parieto-occipital region corresponds to an area of prominent susceptibility on the prior MRI and likely reflects calcification. The ventricles are normal in size. Vascular: Mild atherosclerotic calcification involving the carotid siphons. No hyperdense  vessel. Skull: No acute fracture or suspicious osseous lesion. Sinuses/Orbits: Paranasal sinuses and mastoid air cells are clear. Bilateral cataract extraction. Other: None. IMPRESSION: No evidence of acute intracranial abnormality. Electronically Signed   By: Sebastian Ache M.D.   On: 10/06/2023 11:56   DG Chest 1 View  Result Date: 10/06/2023 CLINICAL DATA:  Shortness of breath. History of asthma and congestive heart failure. EXAM: CHEST  1 VIEW COMPARISON:  06/28/2021 FINDINGS: Heart size appears normal. No pleural effusion. Central airway thickening with peribronchial cuffing noted in bilateral hilar regions. Asymmetric retrocardiac opacification noted in the left base. Right lung clear. IMPRESSION: 1. Central airway thickening with peribronchial cuffing compatible with bronchitis. 2. Asymmetric retrocardiac opacification in the left base may reflect atelectasis or pneumonia. Electronically Signed   By: Signa Kell M.D.   On: 10/06/2023 07:24           LOS: 1 day   Time spent= 35 mins    Miguel Rota, MD [facility]  If 7PM-7AM, please contact night-coverage  10/07/2023, 11:26 AM

## 2023-10-07 NOTE — Hospital Course (Addendum)
  Brief Narrative:  82 year old with history of HTN, HLD, diastolic CHF, hypothyroidism, OA, anemia comes to the hospital complains of cough and congestion started 3 days prior to admission.  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  Upon admission noted to have slight hypoxia, chest x-ray concerning for pneumonia/bronchitis.  Flu, COVID, RSV negative.  CT head is also negative.  Started on empiric Rocephin and azithromycin.   Assessment & Plan:  Principal Problem:   Sepsis due to pneumonia [facility]) Active Problems:   Acute respiratory failure with hypoxia (HCC)   Essential hypertension   Diastolic dysfunction   CKD (chronic kidney disease)   Hypothyroidism   Hyperlipidemia   Anemia of chronic disease   Acute respiratory failure with hypoxia Sepsis secondary to community-acquired pneumonia Sepsis physiology slowly improving.  Concerns of acute bronchitis versus community-acquired pneumonia.  COVID, RSV, flu is negative.  Respiratory panel negative.  Procalcitonin 0.27.  Continue Rocephin/azithromycin for now.  Incentive spirometer flutter valve.  Bronchodilators.  Supportive care.   Essential hypertension On IV as needed   Chronic congestive heart failure with preserved EF, 65%. Overall appears to be euvolemic.   AKI chronic kidney disease stage IV Admission creatinine 1.77, today 2.29.  Will give IV fluids   Hypothyroidism Continue home Synthroid.   Hyperlipidemia -Continue pravastatin   Anemia of chronic disease Hemoglobin is stable around 9.0.   Thrombocytopenia Likely in the setting of acute infection.  Platelets around 120.  Continue to monitor   Obesity BMI 34.9 kg/m  Peripheral neuropathy - Gabapentin   DVT prophylaxis: heparin injection 5,000 Units Start: 10/06/23 1445 Code Status: Full code Family Communication:   Status is: Inpatient Continue hospital stay.  Ongoing management for acute bronchitis and acute kidney  injury    Subjective: Breathing is slightly better but still having quite a bit of cough and congestion   Examination:  General exam: Appears calm and comfortable  Respiratory system: Bowel lateral diffuse rhonchi with mild expiratory wheeze Cardiovascular system: S1 & S2 heard, RRR. No JVD, murmurs, rubs, gallops or clicks. No pedal edema. Gastrointestinal system: Abdomen is nondistended, soft and nontender. No organomegaly or masses felt. Normal bowel sounds heard. Central nervous system: Alert and oriented. No focal neurological deficits. Extremities: Symmetric 5 x 5 power. Skin: No rashes, lesions or ulcers Psychiatry: Judgement and insight appear normal. Mood & affect appropriate.

## 2023-10-08 DIAGNOSIS — A419 Sepsis, unspecified organism: Secondary | ICD-10-CM | POA: Diagnosis not present

## 2023-10-08 DIAGNOSIS — J189 Pneumonia, unspecified organism: Secondary | ICD-10-CM | POA: Diagnosis not present

## 2023-10-08 LAB — CBC
HCT: 25.4 % — ABNORMAL LOW (ref 36.0–46.0)
Hemoglobin: 8.3 g/dL — ABNORMAL LOW (ref 12.0–15.0)
MCH: 29.3 pg (ref 26.0–34.0)
MCHC: 32.7 g/dL (ref 30.0–36.0)
MCV: 89.8 fL (ref 80.0–100.0)
Platelets: 140 10*3/uL — ABNORMAL LOW (ref 150–400)
RBC: 2.83 MIL/uL — ABNORMAL LOW (ref 3.87–5.11)
RDW: 13.3 % (ref 11.5–15.5)
WBC: 4.9 10*3/uL (ref 4.0–10.5)
nRBC: 0 % (ref 0.0–0.2)

## 2023-10-08 LAB — BASIC METABOLIC PANEL
Anion gap: 7 (ref 5–15)
BUN: 24 mg/dL — ABNORMAL HIGH (ref 8–23)
CO2: 22 mmol/L (ref 22–32)
Calcium: 8.5 mg/dL — ABNORMAL LOW (ref 8.9–10.3)
Chloride: 106 mmol/L (ref 98–111)
Creatinine, Ser: 1.9 mg/dL — ABNORMAL HIGH (ref 0.44–1.00)
GFR, Estimated: 26 mL/min — ABNORMAL LOW (ref >60)
Glucose, Bld: 105 mg/dL — ABNORMAL HIGH (ref 70–99)
Potassium: 4.6 mmol/L (ref 3.5–5.1)
Sodium: 135 mmol/L (ref 135–145)

## 2023-10-08 LAB — MAGNESIUM: Magnesium: 2 mg/dL (ref 1.7–2.4)

## 2023-10-08 MED ORDER — PREDNISONE 20 MG PO TABS
50.0000 mg | ORAL_TABLET | Freq: Every day | ORAL | Status: DC
  Administered 2023-10-08 – 2023-10-09 (×2): 50 mg via ORAL
  Filled 2023-10-08 (×2): qty 1

## 2023-10-08 NOTE — Evaluation (Signed)
Physical Therapy Evaluation Patient Details Name: [name] MRN: [id number] DOB: [birth year] Today's Date: 10/08/2023  History of Present Illness  Pt is an 82 y.o. female presenting with productive cough and headache. Found to have PNA/bronchitis. PMH significant for hypothyroidism, HLD, anemia, neuropathy, DMII, migraines,CKD, CHF, sleep apnea.  Clinical Impression  Pt is presenting close to baseline level of functioning. 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 Pt reports that occasionally she would furniture walk at home and has rollator but does not use it. Pt encouraged to use rollator when initially getting home for safety. Pt was staggering initially with gait but improved to supervision level. Pt was short of breathe without significant drops in O2 sats or increase in HR. Due to pt current functional status, home set up and available assistance at home recommending OPPT on discharge in order to address balance and activity tolerance to decrease risk for falls, injury and re-hospitalization. Pt tolerated treatment session well.         If plan is discharge home, recommend the following: Help with stairs or ramp for entrance     Equipment Recommendations None recommended by PT     Functional Status Assessment Patient has had a recent decline in their functional status and demonstrates the ability to make significant improvements in function in a reasonable and predictable amount of time.     Precautions / Restrictions Precautions Precautions: None Restrictions Weight Bearing Restrictions: No      Mobility  Bed Mobility     General bed mobility comments: EOB on arrival and departure    Transfers Overall transfer level: Modified independent Equipment used: None          Ambulation/Gait Ambulation/Gait assistance: Contact guard assist, Supervision Gait Distance (Feet): 200 Feet (2x seated rest break  3 min between  due to shortness of breathe) Assistive device: None Gait Pattern/deviations: Step-through pattern, Staggering right, Staggering left Gait velocity: decreased Gait velocity interpretation: 1.31 - 2.62 ft/sec, indicative of limited community ambulator   General Gait Details: initially pt was CGA due to staggering R/L 4x grabbing for bed, wall and railings in the hall. Pt had to sit and rest for 3 min beween bouts of gait due to shortness of breathe with HR up to 101 bpm and O2 sats 91% on room air. Pt second 200 ft of gait was improved with O2 sats 95% and Hr up to 92 bpm. Pt had less staggering and was supervision for safety.      Balance Overall balance assessment: Needs assistance   Sitting balance-Leahy Scale: Normal     Standing balance support: No upper extremity supported Standing balance-Leahy Scale: Fair Standing balance comment: staggering 4x initially at [facility] then improved to supervision           Pertinent Vitals/Pain Pain Assessment Pain Assessment: No/denies pain    Home Living Family/patient expects to be discharged to:: Private residence Living Arrangements: Alone Available Help at Discharge: Family;Available PRN/intermittently (daughter lives close by) Type of Home: House Home Access: Other (comment) (threshold)       Home Layout: Able to live on main level with bedroom/bathroom Home Equipment: Rollator (4 wheels)      Prior Function Prior Level of Function : Independent/Modified Independent;Driving               ADLs Comments: pt has someone/grandchildren go with her grocery shopping secondary to forgetfulness. working at a day care     Extremity/Trunk Assessment  Upper Extremity Assessment Upper Extremity Assessment: Defer to OT evaluation    Lower Extremity Assessment Lower Extremity Assessment: Overall WFL for tasks assessed    Cervical / Trunk Assessment Cervical / Trunk Assessment: Normal  Communication    Communication Communication: No apparent difficulties  Cognition Arousal: Alert Behavior During Therapy: WFL for tasks assessed/performed Overall Cognitive Status: Within Functional Limits for tasks assessed        General Comments General comments (skin integrity, edema, etc.): SPO2 no lower than 91% this session with seated rest break due to shortness of breathe no significant cardiac compensation        Assessment/Plan    PT Assessment Patient needs continued PT services  PT Problem List Decreased balance;Decreased activity tolerance       PT Treatment Interventions DME instruction;Gait training;Balance training;Therapeutic exercise;Stair training;Functional mobility training;Therapeutic activities;Patient/family education    PT Goals (Current goals can be found in the Care Plan section)  Acute Rehab PT Goals Patient Stated Goal: to return home and back to work PT Goal Formulation: With patient Time For Goal Achievement: 10/22/23 Potential to Achieve Goals: Good    Frequency Min 1X/week        AM-PAC PT "6 Clicks" Mobility  Outcome Measure Help needed turning from your back to your side while in a flat bed without using bedrails?: None Help needed moving from lying on your back to sitting on the side of a flat bed without using bedrails?: None Help needed moving to and from a bed to a chair (including a wheelchair)?: None Help needed standing up from a chair using your arms (e.g., wheelchair or bedside chair)?: None Help needed to walk in hospital room?: A Little Help needed climbing 3-5 steps with a railing? : A Little 6 Click Score: 22    End of Session Equipment Utilized During Treatment: Gait belt Activity Tolerance: Patient tolerated treatment well Patient left: in chair;with call bell/phone within reach Nurse Communication: Mobility status;Other (comment) (pt would like to take shower) PT Visit Diagnosis: Unsteadiness on feet (R26.81)    Time:  1308-6578 PT Time Calculation (min) (ACUTE ONLY): 23 min   Charges:   PT Evaluation $PT Eval Low Complexity: 1 Low PT Treatments $Therapeutic Activity: 23-37 mins PT General Charges $$ ACUTE PT VISIT: 1 Visit        Harrel Carina, DPT, CLT  Acute Rehabilitation Services Office: [phone] (Secure chat preferred)   Claudia Desanctis 10/08/2023, 10:16 AM

## 2023-10-08 NOTE — Progress Notes (Signed)
PROGRESS NOTE    [name]  YQM:[id number] DOB: [birth year] DOA: 10/06/2023 PCP: Corwin Levins, MD     Brief Narrative:  82 year old with history of HTN, HLD, diastolic CHF, hypothyroidism, OA, anemia comes to the hospital complains of cough and congestion started 3 days prior to admission.  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  Upon admission noted to have slight hypoxia, chest x-ray concerning for pneumonia/bronchitis.  Flu, COVID, RSV negative.  CT head is also negative.  Started on empiric Rocephin and azithromycin.  Patient overall continues to feel better.   Assessment & Plan:  Principal Problem:   Sepsis due to pneumonia [facility]) Active Problems:   Acute respiratory failure with hypoxia (HCC)   Essential hypertension   Diastolic dysfunction   CKD (chronic kidney disease)   Hypothyroidism   Hyperlipidemia   Anemia of chronic disease   Acute respiratory failure with hypoxia Sepsis secondary to community-acquired pneumonia Sepsis physiology slowly improving.  Concerns of acute bronchitis versus community-acquired pneumonia.  COVID, RSV, flu is negative.  Respiratory panel negative.  Procalcitonin 0.27. BNP = neg. Continue Rocephin/azithromycin for now.  Incentive spirometer flutter valve.  Bronchodilators.  Supportive care. Add prednisone for wheezing  Essential hypertension On IV as needed   Chronic congestive heart failure with preserved EF, 65%. Overall appears to be euvolemic.   AKI chronic kidney disease stage IV Admission creatinine 1.77 > 2.29 > 1.9 Will advise oral hydration.    Hypothyroidism Continue home Synthroid.   Hyperlipidemia -Continue pravastatin   Anemia of chronic disease Hemoglobin is stable around 9.0.   Thrombocytopenia Likely in the setting of acute infection.  Platelets around 120.  Continue to monitor   Obesity BMI 34.9 kg/m  Peripheral neuropathy - Gabapentin   DVT prophylaxis: heparin injection 5,000  Units Start: 10/06/23 1445 Code Status: Full code Family Communication:   Status is: Inpatient Persistent abnormal breath sounds.  Subjective: Patient seen while she was working with physical therapy.  By the time she walked towards the end of the hallway she became short of breath and had to sit down.  At that time she had abnormal breath sounds. Examination:  General exam: Appears calm and comfortable  Respiratory system: Bowel lateral diffuse rhonchi with mild expiratory wheeze Cardiovascular system: S1 & S2 heard, RRR. No JVD, murmurs, rubs, gallops or clicks. No pedal edema. Gastrointestinal system: Abdomen is nondistended, soft and nontender. No organomegaly or masses felt. Normal bowel sounds heard. Central nervous system: Alert and oriented. No focal neurological deficits. Extremities: Symmetric 5 x 5 power. Skin: No rashes, lesions or ulcers Psychiatry: Judgement and insight appear normal. Mood & affect appropriate.                Diet Orders (From admission, onward)     Start     Ordered   10/06/23 1431  Diet Heart Room service appropriate? Yes; Fluid consistency: Thin  Diet effective now       Question Answer Comment  Room service appropriate? Yes   Fluid consistency: Thin      10/06/23 1432            Objective: Vitals:   10/07/23 2026 10/08/23 0416 10/08/23 0500 10/08/23 0723  BP:  (!) 119/59  (!) 123/57  Pulse: 65 73  64  Resp: 15 16  18   Temp:  98.6 F (37 C)  98 F (36.7 C)  TempSrc:      SpO2: 92% 95%  99%  Weight:  91.9 kg   Height:        Intake/Output Summary (Last 24 hours) at 10/08/2023 1159 Last data filed at 10/07/2023 1849 Gross per 24 hour  Intake 384.12 ml  Output --  Net 384.12 ml   Filed Weights   10/06/23 0626 10/07/23 0500 10/08/23 0500  Weight: 89.4 kg 91.6 kg 91.9 kg    Scheduled Meds:  arformoterol  15 mcg Nebulization BID   ascorbic acid  250 mg Oral Daily   aspirin EC  81 mg Oral Daily   calcium-vitamin  D  1 tablet Oral BID   folic acid  0.5 mg Oral Daily   gabapentin  200 mg Oral 2 times per day   gabapentin  300 mg Oral QHS   guaiFENesin  600 mg Oral BID   heparin  5,000 Units Subcutaneous Q8H   hydrocortisone cream   Topical BID   levothyroxine  112 mcg Oral QAC breakfast   loratadine  10 mg Oral Daily   pravastatin  40 mg Oral Daily   predniSONE  50 mg Oral Q breakfast   revefenacin  175 mcg Nebulization Daily   sodium chloride flush  3 mL Intravenous Q12H   Continuous Infusions:  cefTRIAXone (ROCEPHIN)  IV 2 g (10/08/23 0824)    Nutritional status     Body mass index is 35.89 kg/m.  Data Reviewed:   CBC: Recent Labs  Lab 10/06/23 0700 10/07/23 0436 10/08/23 0448  WBC 8.7 6.1 4.9  HGB 10.6* 8.7* 8.3*  HCT 32.4* 26.5* 25.4*  MCV 89.8 88.9 89.8  PLT 147* 122* 140*   Basic Metabolic Panel: Recent Labs  Lab 10/06/23 0803 10/07/23 0436 10/08/23 0448  NA 138 136 135  K 4.5 4.7 4.6  CL 104 106 106  CO2 26 23 22   GLUCOSE 129* 120* 105*  BUN 17 24* 24*  CREATININE 1.77* 2.29* 1.90*  CALCIUM 9.0 8.4* 8.5*  MG  --   --  2.0   GFR: Estimated Creatinine Clearance: 24.6 mL/min (A) (by C-G formula based on SCr of 1.9 mg/dL (H)). Liver Function Tests: Recent Labs  Lab 10/06/23 0803  AST 20  ALT 13  ALKPHOS 51  BILITOT 0.5  PROT 7.7  ALBUMIN 3.2*   No results for input(s): "LIPASE", "AMYLASE" in the last 168 hours. No results for input(s): "AMMONIA" in the last 168 hours. Coagulation Profile: No results for input(s): "INR", "PROTIME" in the last 168 hours. Cardiac Enzymes: No results for input(s): "CKTOTAL", "CKMB", "CKMBINDEX", "TROPONINI" in the last 168 hours. BNP (last 3 results) No results for input(s): "PROBNP" in the last 8760 hours. HbA1C: No results for input(s): "HGBA1C" in the last 72 hours. CBG: No results for input(s): "GLUCAP" in the last 168 hours. Lipid Profile: No results for input(s): "CHOL", "HDL", "LDLCALC", "TRIG", "CHOLHDL",  "LDLDIRECT" in the last 72 hours. Thyroid Function Tests: No results for input(s): "TSH", "T4TOTAL", "FREET4", "T3FREE", "THYROIDAB" in the last 72 hours. Anemia Panel: No results for input(s): "VITAMINB12", "FOLATE", "FERRITIN", "TIBC", "IRON", "RETICCTPCT" in the last 72 hours. Sepsis Labs: Recent Labs  Lab 10/06/23 0803 10/06/23 0829  PROCALCITON 0.27  --   LATICACIDVEN  --  1.3    Recent Results (from the past 240 hour(s))  SARS Coronavirus 2 by RT PCR (hospital order, performed in [facility] hospital lab) *cepheid single result test* Anterior Nasal Swab     Status: None   Collection Time: 10/06/23  6:40 AM   Specimen: Anterior Nasal Swab  Result Value  Ref Range Status   SARS Coronavirus 2 by RT PCR NEGATIVE NEGATIVE Final    Comment: Performed at [facility], [address], [place], [place] [address]  Culture, blood (routine x 2)     Status: None (Preliminary result)   Collection Time: 10/06/23  8:03 AM   Specimen: BLOOD  Result Value Ref Range Status   Specimen Description BLOOD SITE NOT SPECIFIED  Final   Special Requests   Final    BOTTLES DRAWN AEROBIC AND ANAEROBIC Blood Culture adequate volume   Culture   Final    NO GROWTH 2 DAYS Performed at [facility], [address], [place], [place] [address]    Report Status PENDING  Incomplete  Culture, blood (routine x 2)     Status: None (Preliminary result)   Collection Time: 10/06/23  8:14 AM   Specimen: BLOOD  Result Value Ref Range Status   Specimen Description BLOOD SITE NOT SPECIFIED  Final   Special Requests AEROBIC BOTTLE ONLY Blood Culture adequate volume  Final   Culture   Final    NO GROWTH 2 DAYS Performed at [facility], [address] [place], [place] [address]    Report Status PENDING  Incomplete  Resp panel by RT-PCR (RSV, Flu A&B, Covid) Anterior Nasal Swab     Status: None   Collection Time: 10/06/23 11:20 AM   Specimen: Anterior Nasal Swab  Result Value Ref Range Status    SARS Coronavirus 2 by RT PCR NEGATIVE NEGATIVE Final   Influenza A by PCR NEGATIVE NEGATIVE Final   Influenza B by PCR NEGATIVE NEGATIVE Final    Comment: (NOTE) The Xpert Xpress SARS-CoV-2/FLU/RSV plus assay is intended as an aid in the diagnosis of influenza from Nasopharyngeal swab specimens and should not be used as a sole basis for treatment. Nasal washings and aspirates are unacceptable for Xpert Xpress SARS-CoV-2/FLU/RSV testing.  Fact Sheet for Patients: BloggerCourse.com  Fact Sheet for Healthcare Providers: SeriousBroker.it  This test is not yet approved or cleared by the [place] FDA and has been authorized for detection and/or diagnosis of SARS-CoV-2 by FDA under an Emergency Use Authorization (EUA). This EUA will remain in effect (meaning this test can be used) for the duration of the COVID-19 declaration under Section 564(b)(1) of the Act, 21 U.S.C. section 360bbb-3(b)(1), unless the authorization is terminated or revoked.     Resp Syncytial Virus by PCR NEGATIVE NEGATIVE Final    Comment: (NOTE) Fact Sheet for Patients: BloggerCourse.com  Fact Sheet for Healthcare Providers: SeriousBroker.it  This test is not yet approved or cleared by the [place] FDA and has been authorized for detection and/or diagnosis of SARS-CoV-2 by FDA under an Emergency Use Authorization (EUA). This EUA will remain in effect (meaning this test can be used) for the duration of the COVID-19 declaration under Section 564(b)(1) of the Act, 21 U.S.C. section 360bbb-3(b)(1), unless the authorization is terminated or revoked.  Performed at [facility], [address], [place], [place] [address]   Respiratory (~20 pathogens) panel by PCR     Status: None   Collection Time: 10/07/23  7:51 AM   Specimen: Nasopharyngeal Swab; Respiratory  Result Value Ref Range Status    Adenovirus NOT DETECTED NOT DETECTED Final   Coronavirus 229E NOT DETECTED NOT DETECTED Final    Comment: (NOTE) The Coronavirus on the Respiratory Panel, DOES NOT test for the novel  Coronavirus (2019 nCoV)    Coronavirus HKU1 NOT  DETECTED NOT DETECTED Final   Coronavirus NL63 NOT DETECTED NOT DETECTED Final   Coronavirus OC43 NOT DETECTED NOT DETECTED Final   Metapneumovirus NOT DETECTED NOT DETECTED Final   Rhinovirus / Enterovirus NOT DETECTED NOT DETECTED Final   Influenza A NOT DETECTED NOT DETECTED Final   Influenza B NOT DETECTED NOT DETECTED Final   Parainfluenza Virus 1 NOT DETECTED NOT DETECTED Final   Parainfluenza Virus 2 NOT DETECTED NOT DETECTED Final   Parainfluenza Virus 3 NOT DETECTED NOT DETECTED Final   Parainfluenza Virus 4 NOT DETECTED NOT DETECTED Final   Respiratory Syncytial Virus NOT DETECTED NOT DETECTED Final   Bordetella pertussis NOT DETECTED NOT DETECTED Final   Bordetella Parapertussis NOT DETECTED NOT DETECTED Final   Chlamydophila pneumoniae NOT DETECTED NOT DETECTED Final   Mycoplasma pneumoniae NOT DETECTED NOT DETECTED Final    Comment: Performed at [facility], [address], [place], [place] [address]         Radiology Studies: No results found.         LOS: 2 days   Time spent= 35 mins    Miguel Rota, MD [facility]  If 7PM-7AM, please contact night-coverage  10/08/2023, 11:59 AM

## 2023-10-08 NOTE — Evaluation (Signed)
Occupational Therapy Evaluation Patient Details Name: [name] MRN: [id number] DOB: 1941/10/19 Today's Date: 10/08/2023   History of Present Illness Pt is an 82 y.o. female presenting with productive cough and headache. Found to have PNA/bronchitis. PMH significant for hypothyroidism, HLD, anemia, neuropathy, DMII, migraines,CKD, CHF, sleep apnea.   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 Pt reports some decreased memory during executive functioning tasks, thus, has recently had her grandchildren accompany her to the grocery store. Also admits several rest breaks during IADLs/home making.Upon eval, pt requiring up to 2L supplemental O2 to maintain SpO2 >93% during ADL and mobility. Will continue to follow and recommending OP OT for further cognitive assessment/optimizing use of compensatory techniques for memory.       If plan is discharge home, recommend the following: Other (comment) (on pt request)    Functional Status Assessment  Patient has had a recent decline in their functional status and demonstrates the ability to make significant improvements in function in a reasonable and predictable amount of time.  Equipment Recommendations  Tub/shower seat    Recommendations for Other Services PT consult     Precautions / Restrictions Restrictions Weight Bearing Restrictions: No      Mobility Bed Mobility               General bed mobility comments: EOB on arrival and departure    Transfers Overall transfer level: Modified independent                        Balance Overall balance assessment: Mild deficits observed, not formally tested                                         ADL either performed or assessed with clinical judgement   ADL Overall ADL's : Needs assistance/impaired Eating/Feeding: Sitting;Modified independent   Grooming: Standing;Modified independent   Upper Body Bathing:  Sitting;Modified independent   Lower Body Bathing: Sit to/from stand;Modified independent   Upper Body Dressing : Sitting;Modified independent   Lower Body Dressing: Modified independent   Toilet Transfer: Supervision/safety           Functional mobility during ADLs: Supervision/safety General ADL Comments: supervision for longer distance mobility secondary to pt becoming SOB and pushing self to continue mobility rather than initiating rest break     Vision Baseline Vision/History: 1 Wears glasses Ability to See in Adequate Light: 0 Adequate Patient Visual Report: No change from baseline Vision Assessment?: No apparent visual deficits     Perception Perception: Not tested       Praxis Praxis: Not tested       Pertinent Vitals/Pain Pain Assessment Pain Assessment: No/denies pain     Extremity/Trunk Assessment Upper Extremity Assessment Upper Extremity Assessment: Left hand dominant   Lower Extremity Assessment Lower Extremity Assessment: Defer to PT evaluation       Communication Communication Communication: No apparent difficulties   Cognition Arousal: Alert Behavior During Therapy: WFL for tasks assessed/performed Overall Cognitive Status: Within Functional Limits for tasks assessed                                       General Comments  SpO2 as low as 88 needing standing rest breaks during functional mobility in  the hall. Needing up to 2L supplemental O2 to recover >93%    Exercises     Shoulder Instructions      Home Living Family/patient expects to be discharged to:: Private residence Living Arrangements: Alone Available Help at Discharge: Family;Available PRN/intermittently (daughter lives close by) Type of Home: House Home Access: Other (comment) (threshold)     Home Layout: Able to live on main level with bedroom/bathroom     Bathroom Shower/Tub: Tub/shower unit;Walk-in shower   Bathroom Toilet: Handicapped height      Home Equipment: Rollator (4 wheels)          Prior Functioning/Environment Prior Level of Function : Independent/Modified Independent;Driving               ADLs Comments: pt has someone/grandchildren go with her grocery shopping secondary to forgetfulness. working at a day care        OT Problem List: Decreased activity tolerance;Cardiopulmonary status limiting activity      OT Treatment/Interventions: Self-care/ADL training;Therapeutic exercise;DME and/or AE instruction;Balance training;Patient/family education;Therapeutic activities    OT Goals(Current goals can be found in the care plan section) Acute Rehab OT Goals Patient Stated Goal: get better and back to work OT Goal Formulation: With patient Time For Goal Achievement: 10/22/23 Potential to Achieve Goals: Good  OT Frequency: Min 1X/week    Co-evaluation              AM-PAC OT "6 Clicks" Daily Activity     Outcome Measure Help from another person eating meals?: None Help from another person taking care of personal grooming?: None Help from another person toileting, which includes using toliet, bedpan, or urinal?: A Little Help from another person bathing (including washing, rinsing, drying)?: A Little Help from another person to put on and taking off regular upper body clothing?: None Help from another person to put on and taking off regular lower body clothing?: None 6 Click Score: 22   End of Session Equipment Utilized During Treatment: Gait belt Nurse Communication: Mobility status  Activity Tolerance: Patient tolerated treatment well Patient left: in bed;Other (comment);with call bell/phone within reach (EOB)  OT Visit Diagnosis: Other (comment) (decreased activity tolerance.)                Time: 1308-6578 OT Time Calculation (min): 31 min Charges:  OT General Charges $OT Visit: 1 Visit OT Evaluation $OT Eval Low Complexity: 1 Low OT Treatments $Self Care/Home Management : 8-22  mins  Tyler Deis, OTR/L [facility] Acute Rehabilitation Office: [phone]   Myrla Halsted 10/08/2023, 9:34 AM

## 2023-10-09 ENCOUNTER — Other Ambulatory Visit (HOSPITAL_COMMUNITY): Payer: Self-pay

## 2023-10-09 DIAGNOSIS — A419 Sepsis, unspecified organism: Secondary | ICD-10-CM | POA: Diagnosis not present

## 2023-10-09 DIAGNOSIS — J189 Pneumonia, unspecified organism: Secondary | ICD-10-CM | POA: Diagnosis not present

## 2023-10-09 LAB — CBC
HCT: 26.9 % — ABNORMAL LOW (ref 36.0–46.0)
Hemoglobin: 9.1 g/dL — ABNORMAL LOW (ref 12.0–15.0)
MCH: 29.3 pg (ref 26.0–34.0)
MCHC: 33.8 g/dL (ref 30.0–36.0)
MCV: 86.5 fL (ref 80.0–100.0)
Platelets: 182 10*3/uL (ref 150–400)
RBC: 3.11 MIL/uL — ABNORMAL LOW (ref 3.87–5.11)
RDW: 13.1 % (ref 11.5–15.5)
WBC: 8.1 10*3/uL (ref 4.0–10.5)
nRBC: 0 % (ref 0.0–0.2)

## 2023-10-09 LAB — BASIC METABOLIC PANEL
Anion gap: 10 (ref 5–15)
BUN: 25 mg/dL — ABNORMAL HIGH (ref 8–23)
CO2: 21 mmol/L — ABNORMAL LOW (ref 22–32)
Calcium: 9.5 mg/dL (ref 8.9–10.3)
Chloride: 106 mmol/L (ref 98–111)
Creatinine, Ser: 1.81 mg/dL — ABNORMAL HIGH (ref 0.44–1.00)
GFR, Estimated: 28 mL/min — ABNORMAL LOW (ref >60)
Glucose, Bld: 129 mg/dL — ABNORMAL HIGH (ref 70–99)
Potassium: 5.3 mmol/L — ABNORMAL HIGH (ref 3.5–5.1)
Sodium: 137 mmol/L (ref 135–145)

## 2023-10-09 LAB — MAGNESIUM: Magnesium: 2 mg/dL (ref 1.7–2.4)

## 2023-10-09 MED ORDER — SODIUM ZIRCONIUM CYCLOSILICATE 10 G PO PACK
10.0000 g | PACK | Freq: Once | ORAL | Status: AC
Start: 2023-10-09 — End: 2023-10-09
  Administered 2023-10-09: 10 g via ORAL
  Filled 2023-10-09: qty 1

## 2023-10-09 MED ORDER — PREDNISONE 50 MG PO TABS
50.0000 mg | ORAL_TABLET | Freq: Every day | ORAL | 0 refills | Status: AC
  Filled 2023-10-09: qty 4, 4d supply, fill #0

## 2023-10-09 MED ORDER — ALBUTEROL SULFATE HFA 108 (90 BASE) MCG/ACT IN AERS
2.0000 | INHALATION_SPRAY | Freq: Four times a day (QID) | RESPIRATORY_TRACT | 2 refills | Status: DC | PRN
  Filled 2023-10-09: qty 6.7, 15d supply, fill #0

## 2023-10-09 NOTE — Progress Notes (Signed)
Discharge instructions given. Patient verbalized understanding and all questions were answered.  ?

## 2023-10-09 NOTE — Progress Notes (Signed)
Transition of Care [facility]) - Inpatient Brief Assessment   Patient Details  Name: ILANI PRESTRIDGE MRN: [id number] Date of Birth: 11-01-1941  [facility] of [facility]) CM/SW Contact:    Janae Bridgeman, RN Phone Number: 10/09/2023, 11:38 AM   Clinical Narrative: CM met with the patient briefly before discharging home.  The patient was provided with Medicare choice regarding OUtpatient therapy services and was agreeable to Outpatient referral to [facility] OUtpatient therapy center.  Orders placed to be co-signed by the MD.  No other TOC needs.  Discharge nursing is currently at the bedside to assist with patient's discharge to home with family.   Transition of Care Asessment: Insurance and Status: (P) Insurance coverage has been reviewed Patient has primary care physician: (P) Yes Home environment has been reviewed: (P) from home Prior level of function:: (P) Independent Prior/Current Home Services: (P) No current home services Social Determinants of Health Reivew: (P) SDOH reviewed interventions complete Readmission risk has been reviewed: (P) Yes Transition of care needs: (P) transition of care needs identified, TOC will continue to follow

## 2023-10-09 NOTE — Progress Notes (Signed)
Orthopedic Tech Progress Note Patient Details:  [name] 1941-06-04 [id number] Applied velcro wrist splint per order.  Ortho Devices Type of Ortho Device: Velcro wrist splint Ortho Device/Splint Location: LUE Ortho Device/Splint Interventions: Ordered, Application, Adjustment   Post Interventions Patient Tolerated: Well Instructions Provided: Adjustment of device, Care of device  Blase Mess 10/09/2023, 11:51 AM

## 2023-10-09 NOTE — Progress Notes (Signed)
Occupational Therapy Treatment and Discharge Patient Details Name: [name] MRN: [id number] DOB: [birth year] Today's Date: 10/09/2023   History of present illness Pt is an 82 y.o. female presenting with productive cough and headache. Found to have PNA/bronchitis. PMH significant for hypothyroidism, HLD, anemia, neuropathy, DMII, migraines,CKD, CHF, sleep apnea.   OT comments  Pt is showering, toileting and  walking in her room modified independently. She intermittently sits when showering and needs a new shower seat at home, educated that they are not typically covered by insurance and she could either use her 3 in 1 she already owns or purchase on her own. Pt verbalized understanding. Pt reports plan was to have carpal tunnel surgery tomorrow which was canceled. She uses a wrist cock up splint which is worn. Will request order to replace her L wrist splint while admitted. No further OT needs.       If plan is discharge home, recommend the following:      Equipment Recommendations  Tub/shower seat (if covered by her insurance)    Recommendations for Other Services      Precautions / Restrictions Precautions Precautions: None Restrictions Weight Bearing Restrictions: No       Mobility Bed Mobility Overal bed mobility: Modified Independent                  Transfers Overall transfer level: Modified independent Equipment used: None                     Balance Overall balance assessment: Needs assistance   Sitting balance-Leahy Scale: Normal       Standing balance-Leahy Scale: Fair                             ADL either performed or assessed with clinical judgement   ADL Overall ADL's : Modified independent                                            Extremity/Trunk Assessment Upper Extremity Assessment Upper Extremity Assessment: Right hand dominant;LUE deficits/detail LUE Deficits / Details: was supposed to have  carpal tunnel sx tomorrow            Vision       Perception     Praxis      Cognition Arousal: Alert Behavior During Therapy: WFL for tasks assessed/performed Overall Cognitive Status: Within Functional Limits for tasks assessed                                          Exercises      Shoulder Instructions       General Comments      Pertinent Vitals/ Pain       Pain Assessment Pain Assessment: No/denies pain  Home Living                                          Prior Functioning/Environment              Frequency  Min 1X/week        Progress Toward Goals  OT Goals(current goals can now be  found in the care plan section)  Progress towards OT goals: Goals met/education completed, patient discharged from OT     Plan      Co-evaluation                 AM-PAC OT "6 Clicks" Daily Activity     Outcome Measure   Help from another person eating meals?: None Help from another person taking care of personal grooming?: None Help from another person toileting, which includes using toliet, bedpan, or urinal?: None Help from another person bathing (including washing, rinsing, drying)?: None Help from another person to put on and taking off regular upper body clothing?: None Help from another person to put on and taking off regular lower body clothing?: None 6 Click Score: 24    End of Session Equipment Utilized During Treatment: Gait belt      Activity Tolerance Patient tolerated treatment well   Patient Left in bed;with call bell/phone within reach   Nurse Communication          Time: 8546-2703 OT Time Calculation (min): 20 min  Charges: OT Treatments $Self Care/Home Management : 8-22 mins  Berna Spare, OTR/L Acute Rehabilitation Services Office: [phone]   Evern Bio 10/09/2023, 10:03 AM

## 2023-10-09 NOTE — Discharge Summary (Signed)
Physician Discharge Summary  [name] GBT:[id number] DOB: [birth year] DOA: 10/06/2023  PCP: Corwin Levins, MD  Admit date: 10/06/2023 Discharge date: 10/09/2023  Admitted From: Home Disposition:  Home  Recommendations for Outpatient Follow-up:  Follow up with PCP in 1-2 weeks Please obtain BMP/CBC in one week your next doctors visit.  PO prednisone albuterol upon discharge  Discharge Condition: Stable CODE STATUS: Full Diet recommendation: Cardiac  Brief/Interim Summary:  Brief Narrative:  81 year old with history of HTN, HLD, diastolic CHF, hypothyroidism, OA, anemia comes to the hospital complains of cough and congestion started 3 days prior to admission.  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  Upon admission noted to have slight hypoxia, chest x-ray concerning for pneumonia/bronchitis.  Flu, COVID, RSV negative.  CT head is also negative.  Started on empiric Rocephin and azithromycin.  Patient overall continues to feel better on antibiotics, bronchodilators and steroids.  She wishes to go home today.   Assessment & Plan:  Principal Problem:   Sepsis due to pneumonia [facility]) Active Problems:   Acute respiratory failure with hypoxia (HCC)   Essential hypertension   Diastolic dysfunction   CKD (chronic kidney disease)   Hypothyroidism   Hyperlipidemia   Anemia of chronic disease   Acute respiratory failure with hypoxia; resolved Sepsis secondary to community-acquired pneumonia vs bronchitis Sepsis physiology slowly improving.  Concerns of acute bronchitis versus community-acquired pneumonia.  COVID, RSV, flu is negative.  Respiratory panel negative.  Procalcitonin 0.27. BNP = neg. Feels better. Will dc on PO Prednisone, and albuterol  Hyperkalemia, mild - Suspect from steroid use.  1 dose of Lokelma.  Essential hypertension On IV as needed   Chronic congestive heart failure with preserved EF, 65%. Overall appears to be euvolemic.   AKI  chronic kidney disease stage IV Admission creatinine 1.77 > 2.29 > 1.9 > 1.8 Will advise oral hydration.    Hypothyroidism Continue home Synthroid.   Hyperlipidemia -Continue pravastatin   Anemia of chronic disease Hemoglobin is stable around 9.0.   Thrombocytopenia Likely in the setting of acute infection.  Platelets around 180   Obesity BMI 34.9 kg/m  Peripheral neuropathy - Gabapentin   DVT prophylaxis: heparin injection 5,000 Units Start: 10/06/23 1445 Code Status: Full code Family Communication:   Status is: Inpatient Dc home today  Subjective: Wishing to go home.   Examination:  Constitutional: Not in acute distress Respiratory: Clear to auscultation bilaterally Cardiovascular: Normal sinus rhythm, no rubs Abdomen: Nontender nondistended good bowel sounds Musculoskeletal: No edema noted Skin: No rashes seen Neurologic: CN 2-12 grossly intact.  And nonfocal Psychiatric: Normal judgment and insight. Alert and oriented x 3. Normal mood.      Discharge Diagnoses:  Principal Problem:   Sepsis due to pneumonia [facility]) Active Problems:   Acute respiratory failure with hypoxia (HCC)   Essential hypertension   Diastolic dysfunction   CKD (chronic kidney disease)   Hypothyroidism   Hyperlipidemia   Anemia of chronic disease      Discharge Exam: Vitals:   10/09/23 0810 10/09/23 0840  BP: (!) 152/67   Pulse: 79 78  Resp:  20  Temp: 98 F (36.7 C)   SpO2: 94% 94%   Vitals:   10/09/23 0359 10/09/23 0404 10/09/23 0810 10/09/23 0840  BP: 131/71  (!) 152/67   Pulse: 72  79 78  Resp: 16   20  Temp: 98.3 F (36.8 C)  98 F (36.7 C)   TempSrc: Oral     SpO2:  97%  94% 94%  Weight:  90.4 kg    Height:          Discharge Instructions  Discharge Instructions     Ambulatory referral to Physical Therapy   Complete by: As directed    Please call the patient.  She requests early morning appointments.  Thank you.   Iontophoresis - 4 mg/ml of  dexamethasone: No   T.E.N.S. Unit Evaluation and Dispense as Indicated: No      Allergies as of 10/09/2023       Reactions   Naproxen Itching, Swelling   Throat gets itchy and swells   Flexeril [cyclobenzaprine] Other (See Comments)   sedation   Lactose Intolerance (gi) Diarrhea        Medication List     TAKE these medications    acetaminophen 500 MG tablet Commonly known as: TYLENOL Take 1,000 mg by mouth every 6 (six) hours as needed for moderate pain (pain score 4-6).   albuterol 108 (90 Base) MCG/ACT inhaler Commonly known as: VENTOLIN HFA Inhale 2 puffs into the lungs every 6 (six) hours as needed for wheezing or shortness of breath.   aspirin EC 81 MG tablet Take 1 tablet (81 mg total) by mouth daily.   atenolol 100 MG tablet Commonly known as: TENORMIN Take 1 tablet (100 mg total) by mouth daily.   calcium-vitamin D 500-200 MG-UNIT tablet Commonly known as: OSCAL WITH D Take 1 tablet by mouth 2 (two) times daily.   cholecalciferol 1000 units tablet Commonly known as: VITAMIN D Take 2,000 Units by mouth daily.   Cod Liver Oil Caps Take 1 capsule by mouth daily.   cyanocobalamin 1000 MCG/ML injection Commonly known as: VITAMIN B12 Inject 1,000 mcg into the muscle once.   fexofenadine 180 MG tablet Commonly known as: ALLEGRA Take 1 tablet (180 mg total) by mouth daily.   folic acid 0.5 MG tablet Commonly known as: FOLVITE Take 0.5 mg by mouth daily.   furosemide 40 MG tablet Commonly known as: LASIX 1 tab by mouth per day as needed for swelling   gabapentin 100 MG capsule Commonly known as: NEURONTIN 2 tab by mouth in the am, 2 tab about 3 pm, and 3 tab in the PM   levothyroxine 112 MCG tablet Commonly known as: SYNTHROID Take 1 tablet (112 mcg total) by mouth daily before breakfast.   multivitamin tablet Take 1 tablet by mouth daily.   potassium chloride 10 MEQ tablet Commonly known as: KLOR-CON 1 tab by mouth daily with the lasix  only as needed   pravastatin 40 MG tablet Commonly known as: PRAVACHOL Take 1 tablet (40 mg total) by mouth daily.   predniSONE 50 MG tablet Commonly known as: DELTASONE Take 1 tablet (50 mg total) by mouth daily with breakfast for 4 days. Start taking on: October 10, 2023   pyridOXINE 100 MG tablet Commonly known as: VITAMIN B6 Take 100 mg by mouth daily.   vitamin C 100 MG tablet Take 100 mg by mouth daily.   vitamin E 45 MG (100 UNITS) capsule Take by mouth daily.        Follow-up Information     Corwin Levins, MD Follow up in 1 week(s).   Specialties: Internal Medicine, Radiology Contact information: [address] [place] [address] [phone]         [facility] Follow up.   Specialty: Rehabilitation Why: Please follow up at the Outpatient Rehabilitation center for  follow up therapy needs. Contact information: [address] [place] [address] [phone]               Allergies  Allergen Reactions   Naproxen Itching and Swelling    Throat gets itchy and swells   Flexeril [Cyclobenzaprine] Other (See Comments)    sedation   Lactose Intolerance (Gi) Diarrhea    You were cared for by a hospitalist during your hospital stay. If you have any questions about your discharge medications or the care you received while you were in the hospital after you are discharged, you can call the unit and asked to speak with the hospitalist on call if the hospitalist that took care of you is not available. Once you are discharged, your primary care physician will handle any further medical issues. Please note that no refills for any discharge medications will be authorized once you are discharged, as it is imperative that you return to your primary care physician (or establish a relationship with a primary care physician if you do not have one) for your aftercare needs so that they can  reassess your need for medications and monitor your lab values.  You were cared for by a hospitalist during your hospital stay. If you have any questions about your discharge medications or the care you received while you were in the hospital after you are discharged, you can call the unit and asked to speak with the hospitalist on call if the hospitalist that took care of you is not available. Once you are discharged, your primary care physician will handle any further medical issues. Please note that NO REFILLS for any discharge medications will be authorized once you are discharged, as it is imperative that you return to your primary care physician (or establish a relationship with a primary care physician if you do not have one) for your aftercare needs so that they can reassess your need for medications and monitor your lab values.  Please request your Prim.MD to go over all Hospital Tests and Procedure/Radiological results at the follow up, please get all Hospital records sent to your Prim MD by signing hospital release before you go home.  Get CBC, CMP, 2 view Chest X ray checked  by Primary MD during your next visit or SNF MD in 5-7 days ( we routinely change or add medications that can affect your baseline labs and fluid status, therefore we recommend that you get the mentioned basic workup next visit with your PCP, your PCP may decide not to get them or add new tests based on their clinical decision)  On your next visit with your primary care physician please Get Medicines reviewed and adjusted.  If you experience worsening of your admission symptoms, develop shortness of breath, life threatening emergency, suicidal or homicidal thoughts you must seek medical attention immediately by calling 911 or calling your MD immediately  if symptoms less severe.  You Must read complete instructions/literature along with all the possible adverse reactions/side effects for all the Medicines you take and  that have been prescribed to you. Take any new Medicines after you have completely understood and accpet all the possible adverse reactions/side effects.   Do not drive, operate heavy machinery, perform activities at heights, swimming or participation in water activities or provide baby sitting services if your were admitted for syncope or siezures until you have seen by Primary MD or a Neurologist and advised to do so again.  Do not drive when taking  Pain medications.   Procedures/Studies: CT Head Wo Contrast  Result Date: 10/06/2023 CLINICAL DATA:  Headache, new onset (Age >= 51y). EXAM: CT HEAD WITHOUT CONTRAST TECHNIQUE: Contiguous axial images were obtained from the base of the skull through the vertex without intravenous contrast. RADIATION DOSE REDUCTION: This exam was performed according to the departmental dose-optimization program which includes automated exposure control, adjustment of the mA and/or kV according to patient size and/or use of iterative reconstruction technique. COMPARISON:  Head MRI 03/06/2022 FINDINGS: Brain: There is no evidence of an acute infarct, intracranial hemorrhage, mass, midline shift, or extra-axial fluid collection. A punctate hyperdense focus involving cortex in the right parieto-occipital region corresponds to an area of prominent susceptibility on the prior MRI and likely reflects calcification. The ventricles are normal in size. Vascular: Mild atherosclerotic calcification involving the carotid siphons. No hyperdense vessel. Skull: No acute fracture or suspicious osseous lesion. Sinuses/Orbits: Paranasal sinuses and mastoid air cells are clear. Bilateral cataract extraction. Other: None. IMPRESSION: No evidence of acute intracranial abnormality. Electronically Signed   By: Sebastian Ache M.D.   On: 10/06/2023 11:56   DG Chest 1 View  Result Date: 10/06/2023 CLINICAL DATA:  Shortness of breath. History of asthma and congestive heart failure. EXAM: CHEST  1  VIEW COMPARISON:  06/28/2021 FINDINGS: Heart size appears normal. No pleural effusion. Central airway thickening with peribronchial cuffing noted in bilateral hilar regions. Asymmetric retrocardiac opacification noted in the left base. Right lung clear. IMPRESSION: 1. Central airway thickening with peribronchial cuffing compatible with bronchitis. 2. Asymmetric retrocardiac opacification in the left base may reflect atelectasis or pneumonia. Electronically Signed   By: Signa Kell M.D.   On: 10/06/2023 07:24     The results of significant diagnostics from this hospitalization (including imaging, microbiology, ancillary and laboratory) are listed below for reference.     Microbiology: Recent Results (from the past 240 hour(s))  SARS Coronavirus 2 by RT PCR (hospital order, performed in [facility] hospital lab) *cepheid single result test* Anterior Nasal Swab     Status: None   Collection Time: 10/06/23  6:40 AM   Specimen: Anterior Nasal Swab  Result Value Ref Range Status   SARS Coronavirus 2 by RT PCR NEGATIVE NEGATIVE Final    Comment: Performed at [facility], [address] [place], [place] [address]  Culture, blood (routine x 2)     Status: None (Preliminary result)   Collection Time: 10/06/23  8:03 AM   Specimen: BLOOD  Result Value Ref Range Status   Specimen Description BLOOD SITE NOT SPECIFIED  Final   Special Requests   Final    BOTTLES DRAWN AEROBIC AND ANAEROBIC Blood Culture adequate volume   Culture   Final    NO GROWTH 3 DAYS Performed at [facility], [address], [place], [place] [address]    Report Status PENDING  Incomplete  Culture, blood (routine x 2)     Status: None (Preliminary result)   Collection Time: 10/06/23  8:14 AM   Specimen: BLOOD  Result Value Ref Range Status   Specimen Description BLOOD SITE NOT SPECIFIED  Final   Special Requests AEROBIC BOTTLE ONLY Blood Culture adequate volume  Final   Culture   Final    NO GROWTH 3  DAYS Performed at [facility], [address], [place], [place] [address]    Report Status PENDING  Incomplete  Resp panel by RT-PCR (RSV, Flu A&B, Covid) Anterior Nasal Swab  Status: None   Collection Time: 10/06/23 11:20 AM   Specimen: Anterior Nasal Swab  Result Value Ref Range Status   SARS Coronavirus 2 by RT PCR NEGATIVE NEGATIVE Final   Influenza A by PCR NEGATIVE NEGATIVE Final   Influenza B by PCR NEGATIVE NEGATIVE Final    Comment: (NOTE) The Xpert Xpress SARS-CoV-2/FLU/RSV plus assay is intended as an aid in the diagnosis of influenza from Nasopharyngeal swab specimens and should not be used as a sole basis for treatment. Nasal washings and aspirates are unacceptable for Xpert Xpress SARS-CoV-2/FLU/RSV testing.  Fact Sheet for Patients: BloggerCourse.com  Fact Sheet for Healthcare Providers: SeriousBroker.it  This test is not yet approved or cleared by the [place] FDA and has been authorized for detection and/or diagnosis of SARS-CoV-2 by FDA under an Emergency Use Authorization (EUA). This EUA will remain in effect (meaning this test can be used) for the duration of the COVID-19 declaration under Section 564(b)(1) of the Act, 21 U.S.C. section 360bbb-3(b)(1), unless the authorization is terminated or revoked.     Resp Syncytial Virus by PCR NEGATIVE NEGATIVE Final    Comment: (NOTE) Fact Sheet for Patients: BloggerCourse.com  Fact Sheet for Healthcare Providers: SeriousBroker.it  This test is not yet approved or cleared by the [place] FDA and has been authorized for detection and/or diagnosis of SARS-CoV-2 by FDA under an Emergency Use Authorization (EUA). This EUA will remain in effect (meaning this test can be used) for the duration of the COVID-19 declaration under Section 564(b)(1) of the Act, 21 U.S.C. section 360bbb-3(b)(1), unless the  authorization is terminated or revoked.  Performed at [facility], [address] [place], [place] [address]   Respiratory (~20 pathogens) panel by PCR     Status: None   Collection Time: 10/07/23  7:51 AM   Specimen: Nasopharyngeal Swab; Respiratory  Result Value Ref Range Status   Adenovirus NOT DETECTED NOT DETECTED Final   Coronavirus 229E NOT DETECTED NOT DETECTED Final    Comment: (NOTE) The Coronavirus on the Respiratory Panel, DOES NOT test for the novel  Coronavirus (2019 nCoV)    Coronavirus HKU1 NOT DETECTED NOT DETECTED Final   Coronavirus NL63 NOT DETECTED NOT DETECTED Final   Coronavirus OC43 NOT DETECTED NOT DETECTED Final   Metapneumovirus NOT DETECTED NOT DETECTED Final   Rhinovirus / Enterovirus NOT DETECTED NOT DETECTED Final   Influenza A NOT DETECTED NOT DETECTED Final   Influenza B NOT DETECTED NOT DETECTED Final   Parainfluenza Virus 1 NOT DETECTED NOT DETECTED Final   Parainfluenza Virus 2 NOT DETECTED NOT DETECTED Final   Parainfluenza Virus 3 NOT DETECTED NOT DETECTED Final   Parainfluenza Virus 4 NOT DETECTED NOT DETECTED Final   Respiratory Syncytial Virus NOT DETECTED NOT DETECTED Final   Bordetella pertussis NOT DETECTED NOT DETECTED Final   Bordetella Parapertussis NOT DETECTED NOT DETECTED Final   Chlamydophila pneumoniae NOT DETECTED NOT DETECTED Final   Mycoplasma pneumoniae NOT DETECTED NOT DETECTED Final    Comment: Performed at [facility], [address] [place], [place] [address]     Labs: BNP (last 3 results) Recent Labs    10/06/23 0658 10/07/23 0436  BNP 69.1 84.8   Basic Metabolic Panel: Recent Labs  Lab 10/06/23 0803 10/07/23 0436 10/08/23 0448 10/09/23 0619  NA 138 136 135 137  K 4.5 4.7 4.6 5.3*  CL 104 106 106 106  CO2 26 23 22  21*  GLUCOSE 129* 120* 105* 129*  BUN  17 24* 24* 25*  CREATININE 1.77* 2.29* 1.90* 1.81*  CALCIUM 9.0 8.4* 8.5* 9.5  MG  --   --  2.0 2.0   Liver Function Tests: Recent  Labs  Lab 10/06/23 0803  AST 20  ALT 13  ALKPHOS 51  BILITOT 0.5  PROT 7.7  ALBUMIN 3.2*   No results for input(s): "LIPASE", "AMYLASE" in the last 168 hours. No results for input(s): "AMMONIA" in the last 168 hours. CBC: Recent Labs  Lab 10/06/23 0700 10/07/23 0436 10/08/23 0448 10/09/23 0619  WBC 8.7 6.1 4.9 8.1  HGB 10.6* 8.7* 8.3* 9.1*  HCT 32.4* 26.5* 25.4* 26.9*  MCV 89.8 88.9 89.8 86.5  PLT 147* 122* 140* 182   Cardiac Enzymes: No results for input(s): "CKTOTAL", "CKMB", "CKMBINDEX", "TROPONINI" in the last 168 hours. BNP: Invalid input(s): "POCBNP" CBG: No results for input(s): "GLUCAP" in the last 168 hours. D-Dimer No results for input(s): "DDIMER" in the last 72 hours. Hgb A1c No results for input(s): "HGBA1C" in the last 72 hours. Lipid Profile No results for input(s): "CHOL", "HDL", "LDLCALC", "TRIG", "CHOLHDL", "LDLDIRECT" in the last 72 hours. Thyroid function studies No results for input(s): "TSH", "T4TOTAL", "T3FREE", "THYROIDAB" in the last 72 hours.  Invalid input(s): "FREET3" Anemia work up No results for input(s): "VITAMINB12", "FOLATE", "FERRITIN", "TIBC", "IRON", "RETICCTPCT" in the last 72 hours. Urinalysis    Component Value Date/Time   COLORURINE YELLOW 12/28/2022 0904   APPEARANCEUR CLEAR 12/28/2022 0904   LABSPEC 1.020 12/28/2022 0904   PHURINE 6.0 12/28/2022 0904   GLUCOSEU NEGATIVE 12/28/2022 0904   HGBUR NEGATIVE 12/28/2022 0904   BILIRUBINUR NEGATIVE 12/28/2022 0904   BILIRUBINUR neg 11/12/2018 0910   KETONESUR NEGATIVE 12/28/2022 0904   PROTEINUR Negative 11/12/2018 0910   PROTEINUR NEGATIVE 06/21/2010 1100   UROBILINOGEN 0.2 12/28/2022 0904   NITRITE NEGATIVE 12/28/2022 0904   LEUKOCYTESUR MODERATE (A) 12/28/2022 0904   Sepsis Labs Recent Labs  Lab 10/06/23 0700 10/07/23 0436 10/08/23 0448 10/09/23 0619  WBC 8.7 6.1 4.9 8.1   Microbiology Recent Results (from the past 240 hour(s))  SARS Coronavirus 2 by RT PCR  (hospital order, performed in [facility] hospital lab) *cepheid single result test* Anterior Nasal Swab     Status: None   Collection Time: 10/06/23  6:40 AM   Specimen: Anterior Nasal Swab  Result Value Ref Range Status   SARS Coronavirus 2 by RT PCR NEGATIVE NEGATIVE Final    Comment: Performed at [facility], [address], [place], [place] [address]  Culture, blood (routine x 2)     Status: None (Preliminary result)   Collection Time: 10/06/23  8:03 AM   Specimen: BLOOD  Result Value Ref Range Status   Specimen Description BLOOD SITE NOT SPECIFIED  Final   Special Requests   Final    BOTTLES DRAWN AEROBIC AND ANAEROBIC Blood Culture adequate volume   Culture   Final    NO GROWTH 3 DAYS Performed at [facility], [address] [place], [place] [address]    Report Status PENDING  Incomplete  Culture, blood (routine x 2)     Status: None (Preliminary result)   Collection Time: 10/06/23  8:14 AM   Specimen: BLOOD  Result Value Ref Range Status   Specimen Description BLOOD SITE NOT SPECIFIED  Final   Special Requests AEROBIC BOTTLE ONLY Blood Culture adequate volume  Final   Culture   Final    NO GROWTH 3 DAYS Performed at [facility], [address], [place], [place] [address]    Report Status PENDING  Incomplete  Resp panel by RT-PCR (RSV, Flu A&B, Covid) Anterior Nasal Swab     Status: None   Collection Time: 10/06/23 11:20 AM   Specimen: Anterior Nasal Swab  Result Value Ref Range Status   SARS Coronavirus 2 by RT PCR NEGATIVE NEGATIVE Final   Influenza A by PCR NEGATIVE NEGATIVE Final   Influenza B by PCR NEGATIVE NEGATIVE Final    Comment: (NOTE) The Xpert Xpress SARS-CoV-2/FLU/RSV plus assay is intended as an aid in the diagnosis of influenza from Nasopharyngeal swab specimens and should not be used as a sole basis for treatment. Nasal washings and aspirates are unacceptable for Xpert Xpress SARS-CoV-2/FLU/RSV testing.  Fact Sheet for  Patients: BloggerCourse.com  Fact Sheet for Healthcare Providers: SeriousBroker.it  This test is not yet approved or cleared by the [place] FDA and has been authorized for detection and/or diagnosis of SARS-CoV-2 by FDA under an Emergency Use Authorization (EUA). This EUA will remain in effect (meaning this test can be used) for the duration of the COVID-19 declaration under Section 564(b)(1) of the Act, 21 U.S.C. section 360bbb-3(b)(1), unless the authorization is terminated or revoked.     Resp Syncytial Virus by PCR NEGATIVE NEGATIVE Final    Comment: (NOTE) Fact Sheet for Patients: BloggerCourse.com  Fact Sheet for Healthcare Providers: SeriousBroker.it  This test is not yet approved or cleared by the [place] FDA and has been authorized for detection and/or diagnosis of SARS-CoV-2 by FDA under an Emergency Use Authorization (EUA). This EUA will remain in effect (meaning this test can be used) for the duration of the COVID-19 declaration under Section 564(b)(1) of the Act, 21 U.S.C. section 360bbb-3(b)(1), unless the authorization is terminated or revoked.  Performed at [facility], [address], [place], [place] [address]   Respiratory (~20 pathogens) panel by PCR     Status: None   Collection Time: 10/07/23  7:51 AM   Specimen: Nasopharyngeal Swab; Respiratory  Result Value Ref Range Status   Adenovirus NOT DETECTED NOT DETECTED Final   Coronavirus 229E NOT DETECTED NOT DETECTED Final    Comment: (NOTE) The Coronavirus on the Respiratory Panel, DOES NOT test for the novel  Coronavirus (2019 nCoV)    Coronavirus HKU1 NOT DETECTED NOT DETECTED Final   Coronavirus NL63 NOT DETECTED NOT DETECTED Final   Coronavirus OC43 NOT DETECTED NOT DETECTED Final   Metapneumovirus NOT DETECTED NOT DETECTED Final   Rhinovirus / Enterovirus NOT DETECTED NOT  DETECTED Final   Influenza A NOT DETECTED NOT DETECTED Final   Influenza B NOT DETECTED NOT DETECTED Final   Parainfluenza Virus 1 NOT DETECTED NOT DETECTED Final   Parainfluenza Virus 2 NOT DETECTED NOT DETECTED Final   Parainfluenza Virus 3 NOT DETECTED NOT DETECTED Final   Parainfluenza Virus 4 NOT DETECTED NOT DETECTED Final   Respiratory Syncytial Virus NOT DETECTED NOT DETECTED Final   Bordetella pertussis NOT DETECTED NOT DETECTED Final   Bordetella Parapertussis NOT DETECTED NOT DETECTED Final   Chlamydophila pneumoniae NOT DETECTED NOT DETECTED Final   Mycoplasma pneumoniae NOT DETECTED NOT DETECTED Final    Comment: Performed at [facility], [address] [place], [place] [address]     Time coordinating discharge:  I have spent 35 minutes face to face with the patient and on the ward discussing the patients care, assessment, plan and disposition with other care givers. >50% of  the time was devoted counseling the patient about the risks and benefits of treatment/Discharge disposition and coordinating care.   SIGNED:   Miguel Rota, MD  [facility] 10/09/2023, 12:48 PM   If 7PM-7AM, please contact night-coverage

## 2023-10-09 NOTE — Care Management Important Message (Signed)
Important Message  Patient Details  Name: [name] MRN: [id number] Date of Birth: [birth year]   Important Message Given:  Yes - Medicare IM  Patient left prior to IM delivery will mail to the patients home address.     [name] 10/09/2023, 3:50 PM

## 2023-10-10 ENCOUNTER — Telehealth: Payer: Self-pay | Admitting: *Deleted

## 2023-10-10 NOTE — Transitions of Care (Post Inpatient/ED Visit) (Signed)
   10/10/2023  Name: [name] MRN: [id number] DOB: [birth year]  Today's TOC FU Call Status: Today's TOC FU Call Status:: Unsuccessful Call (1st Attempt) Unsuccessful Call (1st Attempt) Date: 10/10/23  Attempted to reach the patient regarding the most recent Inpatient/ED visit.  Follow Up Plan: Additional outreach attempts will be made to reach the patient to complete the Transitions of Care (Post Inpatient/ED visit) call.   Gean Maidens BSN RN Population Health- Transition of Care Team.  [facility] [phone]

## 2023-10-11 ENCOUNTER — Telehealth: Payer: Self-pay | Admitting: *Deleted

## 2023-10-11 LAB — CULTURE, BLOOD (ROUTINE X 2)
Culture: NO GROWTH
Culture: NO GROWTH
Special Requests: ADEQUATE
Special Requests: ADEQUATE

## 2023-10-11 NOTE — Transitions of Care (Post Inpatient/ED Visit) (Signed)
   10/11/2023  Name: [name] MRN: [id number] DOB: 03-Oct-1941  Today's TOC FU Call Status: Today's TOC FU Call Status:: Unsuccessful Call (3rd Attempt) Unsuccessful Call (3rd Attempt) Date: 10/11/23  Attempted to reach the patient regarding the most recent Inpatient/ED visit.  Follow Up Plan: No further outreach attempts will be made at this time. We have been unable to contact the patient.  Gean Maidens BSN RN Population Health- Transition of Care Team.  [facility] [phone]

## 2023-10-11 NOTE — Transitions of Care (Post Inpatient/ED Visit) (Signed)
   10/11/2023  Name: [name] MRN: [id number] DOB: Dec 29, 1940  Today's TOC FU Call Status: Today's TOC FU Call Status:: Unsuccessful Call (2nd Attempt) Unsuccessful Call (2nd Attempt) Date: 10/11/23  Attempted to reach the patient regarding the most recent Inpatient/ED visit.  Follow Up Plan: Additional outreach attempts will be made to reach the patient to complete the Transitions of Care (Post Inpatient/ED visit) call.   Gean Maidens BSN RN Population Health- Transition of Care Team.  [facility] [phone]

## 2023-10-17 ENCOUNTER — Inpatient Hospital Stay: Payer: Medicare Other | Admitting: Internal Medicine

## 2023-10-23 ENCOUNTER — Inpatient Hospital Stay: Payer: Medicare Other | Admitting: Internal Medicine

## 2023-10-23 ENCOUNTER — Encounter: Payer: Self-pay | Admitting: Internal Medicine

## 2023-10-23 ENCOUNTER — Ambulatory Visit: Payer: Medicare Other | Admitting: Internal Medicine

## 2023-10-23 VITALS — BP 114/68 | HR 50 | Temp 98.3°F | Ht 63.0 in | Wt 208.0 lb

## 2023-10-23 DIAGNOSIS — A419 Sepsis, unspecified organism: Secondary | ICD-10-CM

## 2023-10-23 DIAGNOSIS — Z8701 Personal history of pneumonia (recurrent): Secondary | ICD-10-CM | POA: Diagnosis not present

## 2023-10-23 DIAGNOSIS — N1831 Chronic kidney disease, stage 3a: Secondary | ICD-10-CM

## 2023-10-23 DIAGNOSIS — E611 Iron deficiency: Secondary | ICD-10-CM

## 2023-10-23 DIAGNOSIS — E538 Deficiency of other specified B group vitamins: Secondary | ICD-10-CM | POA: Diagnosis not present

## 2023-10-23 DIAGNOSIS — G5602 Carpal tunnel syndrome, left upper limb: Secondary | ICD-10-CM | POA: Diagnosis not present

## 2023-10-23 DIAGNOSIS — Z8619 Personal history of other infectious and parasitic diseases: Secondary | ICD-10-CM

## 2023-10-23 DIAGNOSIS — R5381 Other malaise: Secondary | ICD-10-CM

## 2023-10-23 LAB — CBC WITH DIFFERENTIAL/PLATELET
Basophils Absolute: 0 10*3/uL (ref 0.0–0.1)
Basophils Relative: 0.4 % (ref 0.0–3.0)
Eosinophils Absolute: 0.1 10*3/uL (ref 0.0–0.7)
Eosinophils Relative: 1.4 % (ref 0.0–5.0)
HCT: 33.5 % — ABNORMAL LOW (ref 36.0–46.0)
Hemoglobin: 11 g/dL — ABNORMAL LOW (ref 12.0–15.0)
Lymphocytes Relative: 24.6 % (ref 12.0–46.0)
Lymphs Abs: 1.8 10*3/uL (ref 0.7–4.0)
MCHC: 32.9 g/dL (ref 30.0–36.0)
MCV: 90.5 fL (ref 78.0–100.0)
Monocytes Absolute: 0.7 10*3/uL (ref 0.1–1.0)
Monocytes Relative: 9.9 % (ref 3.0–12.0)
Neutro Abs: 4.8 10*3/uL (ref 1.4–7.7)
Neutrophils Relative %: 63.7 % (ref 43.0–77.0)
Platelets: 230 10*3/uL (ref 150.0–400.0)
RBC: 3.71 Mil/uL — ABNORMAL LOW (ref 3.87–5.11)
RDW: 14.3 % (ref 11.5–15.5)
WBC: 7.5 10*3/uL (ref 4.0–10.5)

## 2023-10-23 LAB — IBC PANEL
Iron: 58 ug/dL (ref 42–145)
Saturation Ratios: 19 % — ABNORMAL LOW (ref 20.0–50.0)
TIBC: 305.2 ug/dL (ref 250.0–450.0)
Transferrin: 218 mg/dL (ref 212.0–360.0)

## 2023-10-23 LAB — BASIC METABOLIC PANEL
BUN: 24 mg/dL — ABNORMAL HIGH (ref 6–23)
CO2: 29 meq/L (ref 19–32)
Calcium: 9 mg/dL (ref 8.4–10.5)
Chloride: 104 meq/L (ref 96–112)
Creatinine, Ser: 1.76 mg/dL — ABNORMAL HIGH (ref 0.40–1.20)
GFR: 26.7 mL/min — ABNORMAL LOW (ref >60.00)
Glucose, Bld: 91 mg/dL (ref 70–99)
Potassium: 4.4 meq/L (ref 3.5–5.1)
Sodium: 141 meq/L (ref 135–145)

## 2023-10-23 LAB — FERRITIN: Ferritin: 401.4 ng/mL — ABNORMAL HIGH (ref 10.0–291.0)
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
## 2023-10-23 NOTE — Progress Notes (Signed)
The test results show that your current treatment is OK, as the tests are stable.  Please continue the same plan.  There is no other need for change of treatment or further evaluation based on these results, at this time.  thanks 

## 2023-10-23 NOTE — Patient Instructions (Signed)
Please continue all other medications as before, and refills have been done if requested.  Please have the pharmacy call with any other refills you may need.  Please continue your efforts at being more active, low cholesterol diet, and weight control.  Please keep your appointments with your specialists as you may have planned  Please go to the LAB at the blood drawing area for the tests to be done   You will be contacted by phone if any changes need to be made immediately.  Otherwise, you will receive a letter about your results with an explanation, but please check with MyChart first.  Please make an Appointment to return in 4 months, or sooner if needed

## 2023-10-24 ENCOUNTER — Encounter: Payer: Self-pay | Admitting: Internal Medicine

## 2023-10-26 ENCOUNTER — Encounter: Payer: Self-pay | Admitting: Internal Medicine

## 2023-10-26 DIAGNOSIS — R5381 Other malaise: Secondary | ICD-10-CM | POA: Insufficient documentation

## 2023-10-26 NOTE — Assessment & Plan Note (Signed)
Lab Results  Component Value Date   CREATININE 1.76 (H) 10/23/2023   Stable overall, cont to avoid nephrotoxins, f/u renal as planned feb 2025

## 2023-10-26 NOTE — Assessment & Plan Note (Signed)
Lab Results  Component Value Date   VITAMINB12 404 12/28/2022   Stable, cont oral replacement - b12 1000 mcg qd

## 2023-10-26 NOTE — Assessment & Plan Note (Signed)
Clinically resolved, no further antibx needed

## 2023-10-26 NOTE — Assessment & Plan Note (Signed)
Ok for [employer] as planned soon

## 2023-10-26 NOTE — Assessment & Plan Note (Signed)
Already back to work, pt to call for PT as well as planned

## 2023-12-08 DIAGNOSIS — G5602 Carpal tunnel syndrome, left upper limb: Secondary | ICD-10-CM | POA: Diagnosis not present

## 2023-12-14 DIAGNOSIS — M24549 Contracture, unspecified hand: Secondary | ICD-10-CM | POA: Insufficient documentation

## 2023-12-14 DIAGNOSIS — Z4789 Encounter for other orthopedic aftercare: Secondary | ICD-10-CM | POA: Insufficient documentation

## 2023-12-15 DIAGNOSIS — H04123 Dry eye syndrome of bilateral lacrimal glands: Secondary | ICD-10-CM | POA: Diagnosis not present

## 2023-12-15 DIAGNOSIS — E119 Type 2 diabetes mellitus without complications: Secondary | ICD-10-CM | POA: Diagnosis not present

## 2023-12-15 DIAGNOSIS — H524 Presbyopia: Secondary | ICD-10-CM | POA: Diagnosis not present

## 2023-12-15 DIAGNOSIS — H40013 Open angle with borderline findings, low risk, bilateral: Secondary | ICD-10-CM | POA: Diagnosis not present

## 2023-12-15 DIAGNOSIS — H353131 Nonexudative age-related macular degeneration, bilateral, early dry stage: Secondary | ICD-10-CM | POA: Diagnosis not present

## 2023-12-15 LAB — HM DIABETES EYE EXAM

## 2023-12-21 DIAGNOSIS — M25632 Stiffness of left wrist, not elsewhere classified: Secondary | ICD-10-CM | POA: Diagnosis not present

## 2023-12-29 ENCOUNTER — Ambulatory Visit: Payer: Medicare Other | Admitting: Internal Medicine

## 2024-01-08 DIAGNOSIS — N183 Chronic kidney disease, stage 3 unspecified: Secondary | ICD-10-CM | POA: Diagnosis not present

## 2024-01-15 DIAGNOSIS — I129 Hypertensive chronic kidney disease with stage 1 through stage 4 chronic kidney disease, or unspecified chronic kidney disease: Secondary | ICD-10-CM | POA: Diagnosis not present

## 2024-01-15 DIAGNOSIS — N183 Chronic kidney disease, stage 3 unspecified: Secondary | ICD-10-CM | POA: Diagnosis not present

## 2024-01-15 DIAGNOSIS — E1122 Type 2 diabetes mellitus with diabetic chronic kidney disease: Secondary | ICD-10-CM | POA: Diagnosis not present

## 2024-01-15 DIAGNOSIS — I10 Essential (primary) hypertension: Secondary | ICD-10-CM | POA: Diagnosis not present

## 2024-01-15 DIAGNOSIS — E119 Type 2 diabetes mellitus without complications: Secondary | ICD-10-CM | POA: Diagnosis not present

## 2024-02-21 ENCOUNTER — Encounter: Payer: Self-pay | Admitting: Internal Medicine

## 2024-02-21 ENCOUNTER — Ambulatory Visit (INDEPENDENT_AMBULATORY_CARE_PROVIDER_SITE_OTHER): Payer: Medicare Other | Admitting: Internal Medicine

## 2024-02-21 ENCOUNTER — Ambulatory Visit (INDEPENDENT_AMBULATORY_CARE_PROVIDER_SITE_OTHER)

## 2024-02-21 VITALS — BP 132/80 | HR 60 | Temp 97.9°F | Ht 63.0 in | Wt 214.0 lb

## 2024-02-21 DIAGNOSIS — Z0001 Encounter for general adult medical examination with abnormal findings: Secondary | ICD-10-CM

## 2024-02-21 DIAGNOSIS — R0609 Other forms of dyspnea: Secondary | ICD-10-CM

## 2024-02-21 DIAGNOSIS — E538 Deficiency of other specified B group vitamins: Secondary | ICD-10-CM

## 2024-02-21 DIAGNOSIS — E039 Hypothyroidism, unspecified: Secondary | ICD-10-CM | POA: Diagnosis not present

## 2024-02-21 DIAGNOSIS — E559 Vitamin D deficiency, unspecified: Secondary | ICD-10-CM | POA: Diagnosis not present

## 2024-02-21 DIAGNOSIS — D509 Iron deficiency anemia, unspecified: Secondary | ICD-10-CM

## 2024-02-21 DIAGNOSIS — R7302 Impaired glucose tolerance (oral): Secondary | ICD-10-CM | POA: Diagnosis not present

## 2024-02-21 DIAGNOSIS — J309 Allergic rhinitis, unspecified: Secondary | ICD-10-CM | POA: Diagnosis not present

## 2024-02-21 DIAGNOSIS — E7800 Pure hypercholesterolemia, unspecified: Secondary | ICD-10-CM | POA: Diagnosis not present

## 2024-02-21 LAB — FERRITIN: Ferritin: 228.2 ng/mL (ref 10.0–291.0)

## 2024-02-21 LAB — URINALYSIS, ROUTINE W REFLEX MICROSCOPIC
Bilirubin Urine: NEGATIVE
Hgb urine dipstick: NEGATIVE
Ketones, ur: NEGATIVE
Nitrite: NEGATIVE
Specific Gravity, Urine: 1.015 (ref 1.000–1.030)
Total Protein, Urine: NEGATIVE
Urine Glucose: NEGATIVE
Urobilinogen, UA: 0.2 (ref 0.0–1.0)
pH: 6 (ref 5.0–8.0)

## 2024-02-21 LAB — BASIC METABOLIC PANEL WITH GFR
BUN: 18 mg/dL (ref 6–23)
CO2: 28 meq/L (ref 19–32)
Calcium: 9.5 mg/dL (ref 8.4–10.5)
Chloride: 105 meq/L (ref 96–112)
Creatinine, Ser: 1.73 mg/dL — ABNORMAL HIGH (ref 0.40–1.20)
GFR: 27.19 mL/min — ABNORMAL LOW (ref >60.00)
Glucose, Bld: 89 mg/dL (ref 70–99)
Potassium: 4.5 meq/L (ref 3.5–5.1)
Sodium: 139 meq/L (ref 135–145)

## 2024-02-21 LAB — MICROALBUMIN / CREATININE URINE RATIO
Creatinine,U: 97.5 mg/dL
Microalb Creat Ratio: 16.6 mg/g (ref 0.0–30.0)
Microalb, Ur: 1.6 mg/dL (ref 0.0–1.9)

## 2024-02-21 LAB — CBC WITH DIFFERENTIAL/PLATELET
Basophils Absolute: 0 10*3/uL (ref 0.0–0.1)
Basophils Relative: 0.3 % (ref 0.0–3.0)
Eosinophils Absolute: 0.1 10*3/uL (ref 0.0–0.7)
Eosinophils Relative: 0.9 % (ref 0.0–5.0)
HCT: 33.8 % — ABNORMAL LOW (ref 36.0–46.0)
Hemoglobin: 11.4 g/dL — ABNORMAL LOW (ref 12.0–15.0)
Lymphocytes Relative: 21.3 % (ref 12.0–46.0)
Lymphs Abs: 1.4 10*3/uL (ref 0.7–4.0)
MCHC: 33.9 g/dL (ref 30.0–36.0)
MCV: 91 fl (ref 78.0–100.0)
Monocytes Absolute: 0.7 10*3/uL (ref 0.1–1.0)
Monocytes Relative: 9.9 % (ref 3.0–12.0)
Neutro Abs: 4.5 10*3/uL (ref 1.4–7.7)
Neutrophils Relative %: 67.6 % (ref 43.0–77.0)
Platelets: 171 10*3/uL (ref 150.0–400.0)
RBC: 3.71 Mil/uL — ABNORMAL LOW (ref 3.87–5.11)
RDW: 13.4 % (ref 11.5–15.5)
WBC: 6.6 10*3/uL (ref 4.0–10.5)

## 2024-02-21 LAB — HEPATIC FUNCTION PANEL
ALT: 23 U/L (ref 0–35)
AST: 30 U/L (ref 0–37)
Albumin: 4.1 g/dL (ref 3.5–5.2)
Alkaline Phosphatase: 71 U/L (ref 39–117)
Bilirubin, Direct: 0.2 mg/dL (ref 0.0–0.3)
Total Bilirubin: 0.3 mg/dL (ref 0.2–1.2)
Total Protein: 7.6 g/dL (ref 6.0–8.3)

## 2024-02-21 LAB — TSH: TSH: 4.11 u[IU]/mL (ref 0.35–5.50)

## 2024-02-21 LAB — IBC PANEL
Iron: 58 ug/dL (ref 42–145)
Saturation Ratios: 20.2 % (ref 20.0–50.0)
TIBC: 287 ug/dL (ref 250.0–450.0)
Transferrin: 205 mg/dL — ABNORMAL LOW (ref 212.0–360.0)

## 2024-02-21 LAB — LIPID PANEL
Cholesterol: 128 mg/dL (ref 0–200)
HDL: 42.5 mg/dL (ref >39.00)
LDL Cholesterol: 65 mg/dL (ref 0–99)
NonHDL: 85.89
Total CHOL/HDL Ratio: 3
Triglycerides: 106 mg/dL (ref 0.0–149.0)
VLDL: 21.2 mg/dL (ref 0.0–40.0)

## 2024-02-21 LAB — VITAMIN D 25 HYDROXY (VIT D DEFICIENCY, FRACTURES): VITD: 64.36 ng/mL (ref 30.00–100.00)

## 2024-02-21 LAB — HEMOGLOBIN A1C: Hgb A1c MFr Bld: 5.8 % (ref 4.6–6.5)

## 2024-02-21 LAB — VITAMIN B12: Vitamin B-12: 303 pg/mL (ref 211–911)

## 2024-02-21 MED ORDER — POTASSIUM CHLORIDE ER 10 MEQ PO TBCR: EXTENDED_RELEASE_TABLET | ORAL | 3 refills | Status: AC

## 2024-02-21 MED ORDER — LEVOTHYROXINE SODIUM 112 MCG PO TABS: 112.0000 ug | ORAL_TABLET | Freq: Every day | ORAL | 3 refills | Status: AC

## 2024-02-21 MED ORDER — PRAVASTATIN SODIUM 40 MG PO TABS: 40.0000 mg | ORAL_TABLET | Freq: Every day | ORAL | 3 refills | Status: AC

## 2024-02-21 MED ORDER — FEXOFENADINE HCL 180 MG PO TABS: 180.0000 mg | ORAL_TABLET | Freq: Every day | ORAL | 3 refills | Status: AC

## 2024-02-21 MED ORDER — PREDNISONE 10 MG PO TABS: ORAL_TABLET | ORAL | 0 refills | Status: DC

## 2024-02-21 MED ORDER — FUROSEMIDE 40 MG PO TABS: ORAL_TABLET | ORAL | 3 refills | Status: AC

## 2024-02-21 MED ORDER — GABAPENTIN 100 MG PO CAPS
ORAL_CAPSULE | ORAL | 1 refills | Status: AC
Start: 2024-02-21 — End: ?

## 2024-02-21 MED ORDER — ATENOLOL 100 MG PO TABS: 100.0000 mg | ORAL_TABLET | Freq: Every day | ORAL | 3 refills | Status: AC

## 2024-02-21 MED ORDER — ALBUTEROL SULFATE HFA 108 (90 BASE) MCG/ACT IN AERS: 2.0000 | INHALATION_SPRAY | Freq: Four times a day (QID) | RESPIRATORY_TRACT | 11 refills | Status: AC | PRN

## 2024-02-21 NOTE — Assessment & Plan Note (Signed)
 Lab Results  Component Value Date   LDLCALC 111 (H) 06/28/2023   uncontrolled, pt to continue current statin pravachol 40 mg every day - for f/u lab today
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
## 2024-02-21 NOTE — Assessment & Plan Note (Signed)
 Work note given today - no s/s infection, for prednisone taper

## 2024-02-21 NOTE — Assessment & Plan Note (Signed)
Lab Results  Component Value Date   VITAMINB12 404 12/28/2022   Stable, cont oral replacement - b12 1000 mcg qd

## 2024-02-21 NOTE — Assessment & Plan Note (Signed)
 Lab Results  Component Value Date   TSH 1.58 12/28/2022   Stable, pt to continue levothyroxine 112 mcg per day, for f/u tsh

## 2024-02-21 NOTE — Assessment & Plan Note (Signed)
 Mild to mod, none with resting, etiology unclear, for restart albuterol hfa prn, for labs as ordered, for cxr, echo and stress testing, and to f/u any worsening symptoms or concerns

## 2024-02-21 NOTE — Assessment & Plan Note (Signed)

## 2024-02-21 NOTE — Assessment & Plan Note (Signed)
Lab Results  Component Value Date   HGBA1C 5.9 06/28/2023   Stable, pt to continue current medical treatment  - diet, wt control

## 2024-02-21 NOTE — Patient Instructions (Addendum)
 You are given the work note today as you do not appear to have any infection to keep you from the workplace  Please have your Shingrix (shingles) shots done at your local pharmacy.  Please take all new medication as prescribed - the albuterol restart as needed, and Prednisone  Please continue all other medications as before, and refills have been done as requested.  Please have the pharmacy call with any other refills you may need.  Please continue your efforts at being more active, low cholesterol diet, and weight control.  You are otherwise up to date with prevention measures today.  Please keep your appointments with your specialists as you may have planned  You will be contacted regarding the referral for: Echocardiogram, heart Stress testing  Please go to the XRAY Department in the first floor for the x-ray testing  Please go to the LAB at the blood drawing area for the tests to be done  You will be contacted by phone if any changes need to be made immediately.  Otherwise, you will receive a letter about your results with an explanation, but please check with MyChart first.  Please make an Appointment to return in 6 months, or sooner if needed

## 2024-02-21 NOTE — Assessment & Plan Note (Signed)
Last vitamin D Lab Results  Component Value Date   VD25OH 53.10 06/28/2023   Stable, cont oral replacement

## 2024-02-21 NOTE — Assessment & Plan Note (Signed)
Also for fu iron level today

## 2024-02-21 NOTE — Progress Notes (Signed)
 The test results show that your current treatment is OK, as the tests are stable.  Please continue the same plan.  There is no other need for change of treatment or further evaluation based on these results, at this time.  thanks

## 2024-02-24 ENCOUNTER — Ambulatory Visit (HOSPITAL_COMMUNITY)
Admission: EM | Admit: 2024-02-24 | Discharge: 2024-02-24 | Disposition: A | Discharge status: Home / Self Care | Attending: Emergency Medicine | Admitting: Emergency Medicine

## 2024-02-24 ENCOUNTER — Encounter (HOSPITAL_COMMUNITY): Payer: Self-pay | Admitting: Emergency Medicine

## 2024-02-24 ENCOUNTER — Ambulatory Visit (INDEPENDENT_AMBULATORY_CARE_PROVIDER_SITE_OTHER)

## 2024-02-24 DIAGNOSIS — R051 Acute cough: Secondary | ICD-10-CM | POA: Diagnosis not present

## 2024-02-24 DIAGNOSIS — J069 Acute upper respiratory infection, unspecified: Secondary | ICD-10-CM

## 2024-02-24 DIAGNOSIS — I700 Atherosclerosis of aorta: Secondary | ICD-10-CM | POA: Diagnosis not present

## 2024-02-24 DIAGNOSIS — R059 Cough, unspecified: Secondary | ICD-10-CM | POA: Diagnosis not present

## 2024-02-24 MED ORDER — BENZONATATE 100 MG PO CAPS: 100.0000 mg | ORAL_CAPSULE | Freq: Three times a day (TID) | ORAL | 0 refills | Status: DC

## 2024-02-24 MED ORDER — AZELASTINE HCL 0.1 % NA SOLN: 2.0000 | Freq: Two times a day (BID) | NASAL | 0 refills | Status: AC
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
## 2024-02-24 NOTE — Discharge Instructions (Signed)
 Continue taking prednisone taper as prescribed by your primary care provider. I have prescribed Tessalon that you can take every 8 hours as needed for cough. You can use azelastine nasal spray twice daily as needed for congestion. I also recommend plain Mucinex (Guaifenesin) as needed for cough and congestion. Return here or follow-up with your primary care provider if your symptoms persist or worsen. If you develop severe trouble breathing, chest pain, or fevers unrelieved by medication please seek immediate medical treatment in the emergency department.

## 2024-02-24 NOTE — ED Triage Notes (Signed)
 Pt saw PCP 4/10 and was started on pednisone for red throat. Reports last night having nasal congestion and coughing up mucous. Today woke up with hoarse voice.

## 2024-03-03 ENCOUNTER — Ambulatory Visit (HOSPITAL_COMMUNITY)
Admission: EM | Admit: 2024-03-03 | Discharge: 2024-03-03 | Disposition: A | Discharge status: Home / Self Care | Attending: Physician Assistant | Admitting: Physician Assistant

## 2024-03-03 ENCOUNTER — Encounter (HOSPITAL_COMMUNITY): Payer: Self-pay

## 2024-03-03 DIAGNOSIS — H1032 Unspecified acute conjunctivitis, left eye: Secondary | ICD-10-CM

## 2024-03-03 MED ORDER — POLYMYXIN B-TRIMETHOPRIM 10000-0.1 UNIT/ML-% OP SOLN: 1.0000 [drp] | OPHTHALMIC | 0 refills | Status: AC

## 2024-03-03 NOTE — ED Triage Notes (Signed)
 Patient here today with c/o itchy, watery eyes since Friday. Patient has a h/o seasonal allergies. Patient is also having a scratchy throat. Patient completed Prednisone  and has been using Azelastine  nasal spray with some relief. Patient was here a little over a week ago.
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
## 2024-03-26 ENCOUNTER — Telehealth (HOSPITAL_COMMUNITY): Payer: Self-pay | Admitting: Internal Medicine

## 2024-03-26 NOTE — Telephone Encounter (Signed)
 We have attempted to contact the ordering providers office to obtain a prior authorization for the ordered test of Myoview and echocardiogram.  However, we have been unsuccesful. Order will be removed from the active order WQ. Once the prior authorization is obtained we will reinstate the order and schedule patient.    03/18/24 Ibasket sent x 3 - Final request/LBW  03/05/24 Inbasket sent for PA#  02/21/24 Inbasket sent for PA   Thank you

## 2024-03-26 NOTE — Telephone Encounter (Signed)
 Forward to PCP.

## 2024-04-11 ENCOUNTER — Encounter (HOSPITAL_COMMUNITY): Payer: Self-pay | Admitting: Internal Medicine

## 2024-04-12 ENCOUNTER — Encounter: Payer: Self-pay | Admitting: Internal Medicine

## 2024-05-06 ENCOUNTER — Encounter: Payer: Self-pay | Admitting: Internal Medicine

## 2024-06-15 ENCOUNTER — Encounter (HOSPITAL_COMMUNITY): Payer: Self-pay | Admitting: Emergency Medicine

## 2024-06-15 ENCOUNTER — Ambulatory Visit (HOSPITAL_COMMUNITY)
Admission: EM | Admit: 2024-06-15 | Discharge: 2024-06-15 | Disposition: A | Discharge status: Home / Self Care | Attending: Emergency Medicine | Admitting: Emergency Medicine

## 2024-06-15 DIAGNOSIS — B358 Other dermatophytoses: Secondary | ICD-10-CM | POA: Diagnosis not present

## 2024-06-15 MED ORDER — KETOCONAZOLE 2 % EX CREA: 1.0000 | TOPICAL_CREAM | Freq: Every day | CUTANEOUS | 0 refills | Status: AC

## 2024-06-15 NOTE — Discharge Instructions (Addendum)
 Apply ketoconazole  cream once daily to the affected areas. Follow-up with your primary care provider or return here as needed.

## 2024-06-15 NOTE — ED Triage Notes (Signed)
 Pt reports noticed rash on face around Thursday. Pt reports itching. Now has a spot on left breast that found this morning when washing.  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  Tried antifungal cream on area which seemed to helped some.
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
## 2024-06-28 ENCOUNTER — Encounter: Admitting: Internal Medicine

## 2024-08-22 ENCOUNTER — Ambulatory Visit: Admitting: Internal Medicine

## 2024-08-29 ENCOUNTER — Ambulatory Visit: Admitting: Internal Medicine

## 2024-08-29 ENCOUNTER — Ambulatory Visit: Payer: Self-pay | Admitting: Internal Medicine

## 2024-08-29 ENCOUNTER — Encounter: Payer: Self-pay | Admitting: Internal Medicine

## 2024-08-29 VITALS — BP 124/82 | HR 62 | Temp 98.7°F | Ht 63.0 in | Wt 212.0 lb

## 2024-08-29 DIAGNOSIS — E1142 Type 2 diabetes mellitus with diabetic polyneuropathy: Secondary | ICD-10-CM

## 2024-08-29 DIAGNOSIS — E559 Vitamin D deficiency, unspecified: Secondary | ICD-10-CM | POA: Diagnosis not present

## 2024-08-29 DIAGNOSIS — N1832 Chronic kidney disease, stage 3b: Secondary | ICD-10-CM | POA: Diagnosis not present

## 2024-08-29 DIAGNOSIS — R300 Dysuria: Secondary | ICD-10-CM | POA: Diagnosis not present

## 2024-08-29 DIAGNOSIS — I10 Essential (primary) hypertension: Secondary | ICD-10-CM

## 2024-08-29 LAB — URINALYSIS, ROUTINE W REFLEX MICROSCOPIC
Bilirubin Urine: NEGATIVE
Ketones, ur: NEGATIVE
Nitrite: NEGATIVE
Specific Gravity, Urine: 1.01 (ref 1.000–1.030)
Urine Glucose: NEGATIVE
Urobilinogen, UA: 0.2 (ref 0.0–1.0)
pH: 6 (ref 5.0–8.0)

## 2024-08-29 LAB — HEPATIC FUNCTION PANEL
ALT: 15 U/L (ref 0–35)
AST: 22 U/L (ref 0–37)
Albumin: 4 g/dL (ref 3.5–5.2)
Alkaline Phosphatase: 68 U/L (ref 39–117)
Bilirubin, Direct: 0.1 mg/dL (ref 0.0–0.3)
Total Bilirubin: 0.4 mg/dL (ref 0.2–1.2)
Total Protein: 7.5 g/dL (ref 6.0–8.3)

## 2024-08-29 LAB — LIPID PANEL
Cholesterol: 145 mg/dL (ref 0–200)
HDL: 46.6 mg/dL (ref >39.00)
LDL Cholesterol: 81 mg/dL (ref 0–99)
NonHDL: 98.68
Total CHOL/HDL Ratio: 3
Triglycerides: 89 mg/dL (ref 0.0–149.0)
VLDL: 17.8 mg/dL (ref 0.0–40.0)

## 2024-08-29 LAB — BASIC METABOLIC PANEL WITH GFR
BUN: 21 mg/dL (ref 6–23)
CO2: 28 meq/L (ref 19–32)
Calcium: 9.6 mg/dL (ref 8.4–10.5)
Chloride: 107 meq/L (ref 96–112)
Creatinine, Ser: 1.82 mg/dL — ABNORMAL HIGH (ref 0.40–1.20)
GFR: 25.49 mL/min — ABNORMAL LOW (ref >60.00)
Glucose, Bld: 94 mg/dL (ref 70–99)
Potassium: 4.4 meq/L (ref 3.5–5.1)
Sodium: 142 meq/L (ref 135–145)

## 2024-08-29 LAB — HEMOGLOBIN A1C: Hgb A1c MFr Bld: 5.8 % (ref 4.6–6.5)

## 2024-08-29 NOTE — Assessment & Plan Note (Signed)
 None today and exam benign, but had significant episode yesterday, will check ua and Cx, and consider tx pending results

## 2024-08-29 NOTE — Patient Instructions (Signed)

## 2024-08-29 NOTE — Progress Notes (Signed)
 The test results show that your current treatment is OK, as the tests are stable.  Please continue the same plan.  There is no other need for change of treatment or further evaluation based on these results, at this time.  thanks

## 2024-08-29 NOTE — Assessment & Plan Note (Signed)
 BP Readings from Last 3 Encounters:  08/29/24 124/82  06/15/24 (!) 148/65  03/03/24 (!) 165/74   Stable, pt to continue medical treatment tenormin  100 mg qd

## 2024-08-29 NOTE — Assessment & Plan Note (Signed)
 Ckd3b  Lab Results  Component Value Date   CREATININE 1.73 (H) 02/21/2024   Stable overall, cont to avoid nephrotoxins, f/u renal as planned
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
## 2024-08-29 NOTE — Assessment & Plan Note (Addendum)
 With neuropathy stable Lab Results  Component Value Date   CREATININE 1.73 (H) 02/21/2024   Stable overall, cont to avoid nephrotoxins Lab Results  Component Value Date   HGBA1C 5.8 02/21/2024   Stable, pt to continue current medical treatment  - diet, wt control

## 2024-08-29 NOTE — Assessment & Plan Note (Signed)
 Last vitamin D  Lab Results  Component Value Date   VD25OH 64.36 02/21/2024   Stable, cont oral replacement

## 2024-08-30 LAB — URINE CULTURE

## 2024-10-06 NOTE — Progress Notes (Signed)
 Pharmacy Quality Measure Review  This patient is appearing on a report for being at risk of failing the adherence measure for cholesterol (statin) medications this calendar year.   Medication: pravastatin  40 mg Last fill date: 09/21/2024 for 90 day supply  Insurance report was not up to date. No action needed at this time. Refills remaining on prescription.   Woodie Jock, PharmD PGY1 Pharmacy Resident  10/06/2024

## 2024-10-27 ENCOUNTER — Ambulatory Visit (HOSPITAL_COMMUNITY)

## 2024-10-27 ENCOUNTER — Encounter (HOSPITAL_COMMUNITY): Payer: Self-pay

## 2024-10-27 ENCOUNTER — Ambulatory Visit (HOSPITAL_COMMUNITY)
Admission: EM | Admit: 2024-10-27 | Discharge: 2024-10-27 | Disposition: A | Discharge status: Home / Self Care | Source: Home / Self Care

## 2024-10-27 DIAGNOSIS — J069 Acute upper respiratory infection, unspecified: Secondary | ICD-10-CM | POA: Diagnosis not present

## 2024-10-27 DIAGNOSIS — R051 Acute cough: Secondary | ICD-10-CM

## 2024-10-27 LAB — POCT RESPIRATORY SYNCYTIAL VIRUS: RSV Rapid Ag: NEGATIVE

## 2024-10-27 LAB — POC COVID19/FLU A&B COMBO
Covid Antigen, POC: NEGATIVE
Influenza A Antigen, POC: NEGATIVE
Influenza B Antigen, POC: NEGATIVE

## 2024-10-27 MED ORDER — BENZONATATE 200 MG PO CAPS: 200.0000 mg | ORAL_CAPSULE | Freq: Three times a day (TID) | ORAL | 0 refills | Status: AC | PRN
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
## 2024-10-27 NOTE — ED Triage Notes (Signed)
 Patient c/o nasal congestion, cough, SOB, body aches, and a headache x 3 days.  Patient has been taking generic Mucinex  and Loratadine .

## 2024-10-27 NOTE — Discharge Instructions (Addendum)
 Return if any problems.

## 2025-02-27 ENCOUNTER — Ambulatory Visit: Admitting: Internal Medicine
